# Patient Record
Sex: Female | Born: 1935 | Race: White | Hispanic: No | Marital: Married | Smoking: Never smoker
Health system: Southern US, Community
[De-identification: ages and names within clinical notes are randomized; demographics above are authoritative.]

## PROBLEM LIST (undated history)

## (undated) DIAGNOSIS — G459 Transient cerebral ischemic attack, unspecified: Secondary | ICD-10-CM

## (undated) DIAGNOSIS — I10 Essential (primary) hypertension: Secondary | ICD-10-CM

## (undated) DIAGNOSIS — Z860100 Personal history of colon polyps, unspecified: Secondary | ICD-10-CM

## (undated) DIAGNOSIS — R51 Headache: Secondary | ICD-10-CM

## (undated) DIAGNOSIS — R001 Bradycardia, unspecified: Secondary | ICD-10-CM

## (undated) DIAGNOSIS — I480 Paroxysmal atrial fibrillation: Secondary | ICD-10-CM

## (undated) DIAGNOSIS — E785 Hyperlipidemia, unspecified: Secondary | ICD-10-CM

## (undated) DIAGNOSIS — Z9289 Personal history of other medical treatment: Secondary | ICD-10-CM

## (undated) DIAGNOSIS — K219 Gastro-esophageal reflux disease without esophagitis: Secondary | ICD-10-CM

## (undated) DIAGNOSIS — R7303 Prediabetes: Secondary | ICD-10-CM

## (undated) DIAGNOSIS — Z8711 Personal history of peptic ulcer disease: Secondary | ICD-10-CM

## (undated) DIAGNOSIS — M1990 Unspecified osteoarthritis, unspecified site: Secondary | ICD-10-CM

## (undated) DIAGNOSIS — R519 Headache, unspecified: Secondary | ICD-10-CM

## (undated) DIAGNOSIS — G43909 Migraine, unspecified, not intractable, without status migrainosus: Secondary | ICD-10-CM

## (undated) DIAGNOSIS — C4490 Unspecified malignant neoplasm of skin, unspecified: Secondary | ICD-10-CM

## (undated) DIAGNOSIS — H9190 Unspecified hearing loss, unspecified ear: Secondary | ICD-10-CM

## (undated) DIAGNOSIS — N200 Calculus of kidney: Secondary | ICD-10-CM

## (undated) DIAGNOSIS — Z8719 Personal history of other diseases of the digestive system: Secondary | ICD-10-CM

## (undated) DIAGNOSIS — D509 Iron deficiency anemia, unspecified: Secondary | ICD-10-CM

## (undated) DIAGNOSIS — R42 Dizziness and giddiness: Secondary | ICD-10-CM

## (undated) DIAGNOSIS — H269 Unspecified cataract: Secondary | ICD-10-CM

## (undated) DIAGNOSIS — N184 Chronic kidney disease, stage 4 (severe): Secondary | ICD-10-CM

## (undated) DIAGNOSIS — J302 Other seasonal allergic rhinitis: Secondary | ICD-10-CM

## (undated) DIAGNOSIS — IMO0001 Reserved for inherently not codable concepts without codable children: Secondary | ICD-10-CM

## (undated) HISTORY — PX: COLONOSCOPY: SHX174

## (undated) HISTORY — PX: ESOPHAGOGASTRODUODENOSCOPY: SHX1529

## (undated) HISTORY — PX: SKIN CANCER EXCISION: SHX779

## (undated) HISTORY — PX: TUBAL LIGATION: SHX77

## (undated) HISTORY — PX: APPENDECTOMY: SHX54

## (undated) HISTORY — PX: TONSILLECTOMY AND ADENOIDECTOMY: SUR1326

---

## 1978-09-20 HISTORY — PX: OTHER SURGICAL HISTORY: SHX169

## 2007-01-11 ENCOUNTER — Encounter: Admission: RE | Admit: 2007-01-11 | Discharge: 2007-01-11 | Payer: Self-pay | Admitting: Family Medicine

## 2009-09-04 ENCOUNTER — Encounter: Admission: RE | Admit: 2009-09-04 | Discharge: 2009-09-04 | Payer: Self-pay | Admitting: Family Medicine

## 2012-11-19 HISTORY — PX: KNEE ARTHROSCOPY: SUR90

## 2013-04-10 ENCOUNTER — Other Ambulatory Visit: Payer: Self-pay | Admitting: Orthopedic Surgery

## 2013-04-13 ENCOUNTER — Encounter (HOSPITAL_COMMUNITY): Payer: Self-pay | Admitting: Pharmacy Technician

## 2013-04-14 NOTE — Pre-Procedure Instructions (Signed)
[place] Lin[place]  04/14/2013   Your procedure is scheduled on:  Tuesday, April 7th   Report to [facility]  2 * 3 at  8:30  AM.   Call this number if you have problems the morning of surgery: [phone]   Remember:   Do not eat food or drink liquids after midnight Monday.    Take these medicines the morning of surgery with A SIP OF WATER: Nexium.  Please use Flonase & Restasis   Do not wear jewelry, make-up or nail polish.  Do not wear lotions, powders, or perfumes. You may NOT wear deodorant.  Do not shave underarms & legs 48 hours prior to surgery.    Do not bring valuables to the hospital.  [facility] is not responsible for any belongings or valuables.               Contacts, dentures or bridgework may not be worn into surgery.  Leave suitcase in the car. After surgery it may be brought to your room.  For patients admitted to the hospital, discharge time is determined by your treatment team.              Name and phone number of your driver:    Special Instructions:   "Preparing for Surgery" instruction sheet.   Please read over the following fact sheets that you were given: Pain Booklet, Blood Transfusion Information, MRSA Information and Surgical Site Infection Prevention

## 2013-04-17 ENCOUNTER — Encounter (HOSPITAL_COMMUNITY)
Admission: RE | Admit: 2013-04-17 | Discharge: 2013-04-17 | Disposition: A | Discharge status: Home / Self Care | Payer: Medicare Other | Source: Ambulatory Visit | Attending: Anesthesiology | Admitting: Anesthesiology

## 2013-04-17 ENCOUNTER — Encounter (HOSPITAL_COMMUNITY)
Admission: RE | Admit: 2013-04-17 | Discharge: 2013-04-17 | Disposition: A | Discharge status: Home / Self Care | Payer: Medicare Other | Source: Ambulatory Visit | Attending: Orthopedic Surgery | Admitting: Orthopedic Surgery

## 2013-04-17 ENCOUNTER — Encounter (HOSPITAL_COMMUNITY): Payer: Self-pay

## 2013-04-17 DIAGNOSIS — Z01810 Encounter for preprocedural cardiovascular examination: Secondary | ICD-10-CM | POA: Diagnosis not present

## 2013-04-17 DIAGNOSIS — Z01812 Encounter for preprocedural laboratory examination: Secondary | ICD-10-CM | POA: Insufficient documentation

## 2013-04-17 DIAGNOSIS — Z01818 Encounter for other preprocedural examination: Secondary | ICD-10-CM | POA: Insufficient documentation

## 2013-04-17 HISTORY — DX: Personal history of colonic polyps: Z86.010

## 2013-04-17 HISTORY — DX: Essential (primary) hypertension: I10

## 2013-04-17 HISTORY — DX: Personal history of other medical treatment: Z92.89

## 2013-04-17 HISTORY — DX: Reserved for inherently not codable concepts without codable children: IMO0001

## 2013-04-17 HISTORY — DX: Personal history of peptic ulcer disease: Z87.11

## 2013-04-17 HISTORY — DX: Other seasonal allergic rhinitis: J30.2

## 2013-04-17 HISTORY — DX: Personal history of colon polyps, unspecified: Z86.0100

## 2013-04-17 HISTORY — DX: Unspecified cataract: H26.9

## 2013-04-17 HISTORY — DX: Transient cerebral ischemic attack, unspecified: G45.9

## 2013-04-17 HISTORY — DX: Unspecified osteoarthritis, unspecified site: M19.90

## 2013-04-17 HISTORY — DX: Personal history of other diseases of the digestive system: Z87.19

## 2013-04-17 HISTORY — DX: Gastro-esophageal reflux disease without esophagitis: K21.9

## 2013-04-17 HISTORY — DX: Unspecified hearing loss, unspecified ear: H91.90

## 2013-04-17 HISTORY — DX: Dizziness and giddiness: R42

## 2013-04-17 HISTORY — DX: Hyperlipidemia, unspecified: E78.5

## 2013-04-17 LAB — TYPE AND SCREEN
ABO/RH(D): O POS
Antibody Screen: NEGATIVE

## 2013-04-17 LAB — BASIC METABOLIC PANEL
BUN: 19 mg/dL (ref 6–23)
CO2: 24 mEq/L (ref 19–32)
Calcium: 9.7 mg/dL (ref 8.4–10.5)
Chloride: 96 mEq/L (ref 96–112)
Creatinine, Ser: 1.04 mg/dL (ref 0.50–1.10)
GFR calc Af Amer: 58 mL/min — ABNORMAL LOW (ref >90)
GFR calc non Af Amer: 50 mL/min — ABNORMAL LOW (ref >90)
Glucose, Bld: 102 mg/dL — ABNORMAL HIGH (ref 70–99)
Potassium: 4.1 mEq/L (ref 3.7–5.3)
Sodium: 136 mEq/L — ABNORMAL LOW (ref 137–147)

## 2013-04-17 LAB — CBC
HCT: 44.1 % (ref 36.0–46.0)
Hemoglobin: 14.9 g/dL (ref 12.0–15.0)
MCH: 31.1 pg (ref 26.0–34.0)
MCHC: 33.8 g/dL (ref 30.0–36.0)
MCV: 92.1 fL (ref 78.0–100.0)
Platelets: 305 10*3/uL (ref 150–400)
RBC: 4.79 MIL/uL (ref 3.87–5.11)
RDW: 13.1 % (ref 11.5–15.5)
WBC: 8 10*3/uL (ref 4.0–10.5)

## 2013-04-17 LAB — APTT: aPTT: 32 seconds (ref 24–37)

## 2013-04-17 LAB — SURGICAL PCR SCREEN
MRSA, PCR: NEGATIVE
Staphylococcus aureus: NEGATIVE

## 2013-04-17 LAB — PROTIME-INR
INR: 0.97 (ref 0.00–1.49)
Prothrombin Time: 12.7 seconds (ref 11.6–15.2)

## 2013-04-17 LAB — ABO/RH: ABO/RH(D): O POS

## 2013-04-17 NOTE — Progress Notes (Signed)
Pt doesn't have a cardiologist  Denies ever having a stress test/echo/heart cath  Denies EKG or CXR in past yr  Medical MD is Dr.Sheila Nolon Rod

## 2013-04-17 NOTE — Pre-Procedure Instructions (Signed)
Ashley Nguyen  04/17/2013   Your procedure is scheduled on:  Tues, April 7 @ 10:30 AM  Report to [facility]  at 8:30 AM.  Call this number if you have problems the morning of surgery: [phone]   Remember:   Do not eat food or drink liquids after midnight.   Take these medicines the morning of surgery with A SIP OF WATER: Restasis(Cyclsporine),Nexium(Esomeprazole),and Flonase(Fluticasone)              No Goody's,BC',Aleve,Aspirin,Ibuprofen,Fish Oil,or any Herbal Medications   Do not wear jewelry, make-up or nail polish.  Do not wear lotions, powders, or perfumes. You may wear deodorant.  Do not shave 48 hours prior to surgery.   Do not bring valuables to the hospital.  [facility] is not responsible                  for any belongings or valuables.               Contacts, dentures or bridgework may not be worn into surgery.  Leave suitcase in the car. After surgery it may be brought to your room.  For patients admitted to the hospital, discharge time is determined by your                treatment team.                 Special Instructions:  [facility] - Preparing for Surgery  Before surgery, you can play an important role.  Because skin is not sterile, your skin needs to be as free of germs as possible.  You can reduce the number of germs on you skin by washing with CHG (chlorahexidine gluconate) soap before surgery.  CHG is an antiseptic cleaner which kills germs and bonds with the skin to continue killing germs even after washing.  Please DO NOT use if you have an allergy to CHG or antibacterial soaps.  If your skin becomes reddened/irritated stop using the CHG and inform your nurse when you arrive at Short Stay.  Do not shave (including legs and underarms) for at least 48 hours prior to the first CHG shower.  You may shave your face.  Please follow these instructions carefully:   1.  Shower with CHG Soap the night before surgery and the                                 morning of Surgery.  2.  If you choose to wash your hair, wash your hair first as usual with your       normal shampoo.  3.  After you shampoo, rinse your hair and body thoroughly to remove the                      Shampoo.  4.  Use CHG as you would any other liquid soap.  You can apply chg directly       to the skin and wash gently with scrungie or a clean washcloth.  5.  Apply the CHG Soap to your body ONLY FROM THE NECK DOWN.        Do not use on open wounds or open sores.  Avoid contact with your eyes,       ears, mouth and genitals (private parts).  Wash genitals (private parts)       with your normal soap.  6.  Wash thoroughly, paying special attention to the area where your surgery        will be performed.  7.  Thoroughly rinse your body with warm water from the neck down.  8.  DO NOT shower/wash with your normal soap after using and rinsing off       the CHG Soap.  9.  Pat yourself dry with a clean towel.            10.  Wear clean pajamas.            11.  Place clean sheets on your bed the night of your first shower and do not        sleep with pets.  Day of Surgery  Do not apply any lotions/deoderants the morning of surgery.  Please wear clean clothes to the hospital/surgery center.     Please read over the following fact sheets that you were given: Pain Booklet, Coughing and Deep Breathing, Blood Transfusion Information, MRSA Information and Surgical Site Infection Prevention

## 2013-04-24 MED ORDER — CEFAZOLIN SODIUM-DEXTROSE 2-3 GM-% IV SOLR
2.0000 g | INTRAVENOUS | Status: AC
  Administered 2013-04-25: 2 g via INTRAVENOUS
  Filled 2013-04-24: qty 50

## 2013-04-24 NOTE — Progress Notes (Signed)
Patient notified of new arrival time of 07:40 for surgery on 04/24/13 verbalized understanding

## 2013-04-25 ENCOUNTER — Inpatient Hospital Stay (HOSPITAL_COMMUNITY): Payer: Medicare Other | Admitting: Anesthesiology

## 2013-04-25 ENCOUNTER — Inpatient Hospital Stay (HOSPITAL_COMMUNITY)
Admission: RE | Admit: 2013-04-25 | Discharge: 2013-04-28 | DRG: 470 | Disposition: A | Discharge status: Home / Self Care | Payer: Medicare Other | Source: Ambulatory Visit | Attending: Orthopedic Surgery | Admitting: Orthopedic Surgery

## 2013-04-25 ENCOUNTER — Inpatient Hospital Stay (HOSPITAL_COMMUNITY): Payer: Medicare Other

## 2013-04-25 ENCOUNTER — Encounter (HOSPITAL_COMMUNITY): Payer: Self-pay | Admitting: *Deleted

## 2013-04-25 ENCOUNTER — Encounter (HOSPITAL_COMMUNITY)
Admission: RE | Disposition: A | Discharge status: Home / Self Care | Payer: Self-pay | Source: Ambulatory Visit | Attending: Orthopedic Surgery

## 2013-04-25 ENCOUNTER — Encounter (HOSPITAL_COMMUNITY): Payer: Medicare Other | Admitting: Anesthesiology

## 2013-04-25 DIAGNOSIS — E119 Type 2 diabetes mellitus without complications: Secondary | ICD-10-CM | POA: Diagnosis present

## 2013-04-25 DIAGNOSIS — K219 Gastro-esophageal reflux disease without esophagitis: Secondary | ICD-10-CM | POA: Diagnosis present

## 2013-04-25 DIAGNOSIS — I4891 Unspecified atrial fibrillation: Secondary | ICD-10-CM | POA: Diagnosis not present

## 2013-04-25 DIAGNOSIS — Z8719 Personal history of other diseases of the digestive system: Secondary | ICD-10-CM | POA: Insufficient documentation

## 2013-04-25 DIAGNOSIS — E669 Obesity, unspecified: Secondary | ICD-10-CM | POA: Diagnosis present

## 2013-04-25 DIAGNOSIS — Z8673 Personal history of transient ischemic attack (TIA), and cerebral infarction without residual deficits: Secondary | ICD-10-CM

## 2013-04-25 DIAGNOSIS — M1710 Unilateral primary osteoarthritis, unspecified knee: Secondary | ICD-10-CM | POA: Diagnosis present

## 2013-04-25 DIAGNOSIS — Z6836 Body mass index (BMI) 36.0-36.9, adult: Secondary | ICD-10-CM

## 2013-04-25 DIAGNOSIS — H9190 Unspecified hearing loss, unspecified ear: Secondary | ICD-10-CM | POA: Diagnosis present

## 2013-04-25 DIAGNOSIS — E785 Hyperlipidemia, unspecified: Secondary | ICD-10-CM | POA: Diagnosis present

## 2013-04-25 DIAGNOSIS — I480 Paroxysmal atrial fibrillation: Secondary | ICD-10-CM

## 2013-04-25 DIAGNOSIS — Z79899 Other long term (current) drug therapy: Secondary | ICD-10-CM

## 2013-04-25 DIAGNOSIS — I10 Essential (primary) hypertension: Secondary | ICD-10-CM | POA: Diagnosis present

## 2013-04-25 HISTORY — DX: Paroxysmal atrial fibrillation: I48.0

## 2013-04-25 HISTORY — DX: Unspecified osteoarthritis, unspecified site: M19.90

## 2013-04-25 HISTORY — PX: TOTAL KNEE ARTHROPLASTY: SHX125

## 2013-04-25 LAB — GLUCOSE, CAPILLARY: Glucose-Capillary: 127 mg/dL — ABNORMAL HIGH (ref 70–99)

## 2013-04-25 SURGERY — ARTHROPLASTY, KNEE, TOTAL
Anesthesia: General | Site: Knee | Laterality: Left

## 2013-04-25 MED ORDER — BUPIVACAINE LIPOSOME 1.3 % IJ SUSP
20.0000 mL | INTRAMUSCULAR | Status: DC
  Filled 2013-04-25: qty 20

## 2013-04-25 MED ORDER — METOCLOPRAMIDE HCL 5 MG/ML IJ SOLN
5.0000 mg | Freq: Three times a day (TID) | INTRAMUSCULAR | Status: DC | PRN
  Administered 2013-04-25: 10 mg via INTRAVENOUS
  Filled 2013-04-25: qty 2

## 2013-04-25 MED ORDER — ONDANSETRON HCL 4 MG PO TABS: 4.0000 mg | ORAL_TABLET | Freq: Four times a day (QID) | ORAL | Status: DC | PRN

## 2013-04-25 MED ORDER — BUPIVACAINE LIPOSOME 1.3 % IJ SUSP
INTRAMUSCULAR | Status: DC | PRN
  Administered 2013-04-25: 20 mL

## 2013-04-25 MED ORDER — FENTANYL CITRATE 0.05 MG/ML IJ SOLN
INTRAMUSCULAR | Status: DC | PRN
  Administered 2013-04-25: 25 ug via INTRAVENOUS
  Administered 2013-04-25 (×5): 50 ug via INTRAVENOUS
  Administered 2013-04-25: 25 ug via INTRAVENOUS
  Administered 2013-04-25: 50 ug via INTRAVENOUS
  Administered 2013-04-25 (×2): 25 ug via INTRAVENOUS

## 2013-04-25 MED ORDER — PANTOPRAZOLE SODIUM 40 MG PO TBEC
40.0000 mg | DELAYED_RELEASE_TABLET | Freq: Every day | ORAL | Status: DC
Start: 2013-04-26 — End: 2013-04-28
  Administered 2013-04-26 – 2013-04-28 (×3): 40 mg via ORAL
  Filled 2013-04-25 (×3): qty 1

## 2013-04-25 MED ORDER — PROPRANOLOL HCL 1 MG/ML IV SOLN
INTRAVENOUS | Status: AC
  Filled 2013-04-25: qty 1

## 2013-04-25 MED ORDER — BUPIVACAINE-EPINEPHRINE PF 0.5-1:200000 % IJ SOLN
INTRAMUSCULAR | Status: DC | PRN
  Administered 2013-04-25: 25 mL via PERINEURAL

## 2013-04-25 MED ORDER — EPHEDRINE SULFATE 50 MG/ML IJ SOLN
INTRAMUSCULAR | Status: AC
  Filled 2013-04-25: qty 1

## 2013-04-25 MED ORDER — FLUTICASONE PROPIONATE 50 MCG/ACT NA SUSP
1.0000 | Freq: Every day | NASAL | Status: DC
  Administered 2013-04-25 – 2013-04-27 (×3): 1 via NASAL
  Filled 2013-04-25: qty 16

## 2013-04-25 MED ORDER — ONDANSETRON HCL 4 MG/2ML IJ SOLN
4.0000 mg | Freq: Four times a day (QID) | INTRAMUSCULAR | Status: DC | PRN
  Administered 2013-04-25: 4 mg via INTRAVENOUS

## 2013-04-25 MED ORDER — PROMETHAZINE HCL 25 MG/ML IJ SOLN: 6.2500 mg | INTRAMUSCULAR | Status: DC | PRN

## 2013-04-25 MED ORDER — LISINOPRIL-HYDROCHLOROTHIAZIDE 20-25 MG PO TABS: 1.0000 | ORAL_TABLET | Freq: Every day | ORAL | Status: DC

## 2013-04-25 MED ORDER — MIDAZOLAM HCL 5 MG/5ML IJ SOLN
INTRAMUSCULAR | Status: DC | PRN
  Administered 2013-04-25: 1 mg via INTRAVENOUS

## 2013-04-25 MED ORDER — SODIUM CHLORIDE 0.9 % IR SOLN
Status: DC | PRN
  Administered 2013-04-25: 1000 mL

## 2013-04-25 MED ORDER — LIDOCAINE HCL (CARDIAC) 20 MG/ML IV SOLN
INTRAVENOUS | Status: AC
  Filled 2013-04-25: qty 5

## 2013-04-25 MED ORDER — GLYCOPYRROLATE 0.2 MG/ML IJ SOLN
INTRAMUSCULAR | Status: AC
Start: 2013-04-25 — End: 2013-04-25
  Filled 2013-04-25: qty 2

## 2013-04-25 MED ORDER — MENTHOL 3 MG MT LOZG: 1.0000 | LOZENGE | OROMUCOSAL | Status: DC | PRN

## 2013-04-25 MED ORDER — METOCLOPRAMIDE HCL 5 MG PO TABS
5.0000 mg | ORAL_TABLET | Freq: Three times a day (TID) | ORAL | Status: DC | PRN
  Filled 2013-04-25: qty 2

## 2013-04-25 MED ORDER — OXYCODONE HCL 5 MG PO TABS: 5.0000 mg | ORAL_TABLET | Freq: Once | ORAL | Status: DC | PRN

## 2013-04-25 MED ORDER — DEXTROSE-NACL 5-0.45 % IV SOLN
INTRAVENOUS | Status: AC
  Administered 2013-04-25: 20:00:00 via INTRAVENOUS

## 2013-04-25 MED ORDER — FENTANYL CITRATE 0.05 MG/ML IJ SOLN
INTRAMUSCULAR | Status: AC
  Filled 2013-04-25: qty 5

## 2013-04-25 MED ORDER — NEOSTIGMINE METHYLSULFATE 1 MG/ML IJ SOLN
INTRAMUSCULAR | Status: DC | PRN
  Administered 2013-04-25: 3 mg via INTRAVENOUS

## 2013-04-25 MED ORDER — HYDROMORPHONE HCL PF 1 MG/ML IJ SOLN
INTRAMUSCULAR | Status: AC
  Filled 2013-04-25: qty 1

## 2013-04-25 MED ORDER — ONDANSETRON HCL 4 MG/2ML IJ SOLN
INTRAMUSCULAR | Status: AC
  Filled 2013-04-25: qty 2

## 2013-04-25 MED ORDER — HYDROMORPHONE HCL PF 1 MG/ML IJ SOLN
0.2500 mg | INTRAMUSCULAR | Status: DC | PRN
  Administered 2013-04-25 (×3): 0.5 mg via INTRAVENOUS

## 2013-04-25 MED ORDER — ROCURONIUM BROMIDE 100 MG/10ML IV SOLN
INTRAVENOUS | Status: DC | PRN
  Administered 2013-04-25: 20 mg via INTRAVENOUS

## 2013-04-25 MED ORDER — ARTIFICIAL TEARS OP OINT
TOPICAL_OINTMENT | OPHTHALMIC | Status: AC
  Filled 2013-04-25: qty 3.5

## 2013-04-25 MED ORDER — METOCLOPRAMIDE HCL 5 MG/5ML PO SOLN
10.0000 mg | Freq: Four times a day (QID) | ORAL | Status: DC | PRN
  Filled 2013-04-25: qty 10

## 2013-04-25 MED ORDER — LIDOCAINE HCL (CARDIAC) 20 MG/ML IV SOLN
INTRAVENOUS | Status: DC | PRN
  Administered 2013-04-25: 60 mg via INTRAVENOUS

## 2013-04-25 MED ORDER — PROPRANOLOL HCL 1 MG/ML IV SOLN
INTRAVENOUS | Status: DC | PRN
Start: 2013-04-25 — End: 2013-04-25
  Administered 2013-04-25 (×4): .25 mg via INTRAVENOUS

## 2013-04-25 MED ORDER — DEXAMETHASONE SODIUM PHOSPHATE 4 MG/ML IJ SOLN
INTRAMUSCULAR | Status: DC | PRN
  Administered 2013-04-25: 4 mg via INTRAVENOUS

## 2013-04-25 MED ORDER — OXYCODONE HCL 5 MG/5ML PO SOLN: 5.0000 mg | Freq: Once | ORAL | Status: DC | PRN

## 2013-04-25 MED ORDER — LISINOPRIL 20 MG PO TABS
20.0000 mg | ORAL_TABLET | Freq: Every day | ORAL | Status: DC
  Filled 2013-04-25 (×3): qty 1

## 2013-04-25 MED ORDER — METOPROLOL TARTRATE 25 MG PO TABS
25.0000 mg | ORAL_TABLET | Freq: Two times a day (BID) | ORAL | Status: DC
  Administered 2013-04-25 – 2013-04-28 (×7): 25 mg via ORAL
  Filled 2013-04-25 (×7): qty 1

## 2013-04-25 MED ORDER — ACETAMINOPHEN 650 MG RE SUPP: 650.0000 mg | Freq: Four times a day (QID) | RECTAL | Status: DC | PRN

## 2013-04-25 MED ORDER — MIDAZOLAM HCL 2 MG/2ML IJ SOLN
INTRAMUSCULAR | Status: AC
  Filled 2013-04-25: qty 2

## 2013-04-25 MED ORDER — ACETAMINOPHEN 325 MG PO TABS
650.0000 mg | ORAL_TABLET | Freq: Four times a day (QID) | ORAL | Status: DC | PRN
  Administered 2013-04-26 – 2013-04-27 (×4): 650 mg via ORAL
  Filled 2013-04-25 (×4): qty 2

## 2013-04-25 MED ORDER — MORPHINE SULFATE 2 MG/ML IJ SOLN
2.0000 mg | INTRAMUSCULAR | Status: DC | PRN
  Administered 2013-04-25 (×2): 2 mg via INTRAVENOUS
  Filled 2013-04-25 (×2): qty 1

## 2013-04-25 MED ORDER — CYCLOSPORINE 0.05 % OP EMUL
1.0000 [drp] | Freq: Two times a day (BID) | OPHTHALMIC | Status: DC
  Administered 2013-04-25 – 2013-04-28 (×6): 1 [drp] via OPHTHALMIC
  Filled 2013-04-25 (×7): qty 1

## 2013-04-25 MED ORDER — OXYCODONE HCL 5 MG PO TABS
5.0000 mg | ORAL_TABLET | ORAL | Status: DC | PRN
  Administered 2013-04-25: 10 mg via ORAL
  Administered 2013-04-26: 5 mg via ORAL
  Administered 2013-04-26: 10 mg via ORAL
  Administered 2013-04-26: 5 mg via ORAL
  Administered 2013-04-26 – 2013-04-27 (×5): 10 mg via ORAL
  Administered 2013-04-27 (×2): 5 mg via ORAL
  Administered 2013-04-28 (×2): 10 mg via ORAL
  Filled 2013-04-25 (×3): qty 1
  Filled 2013-04-25 (×4): qty 2
  Filled 2013-04-25: qty 1
  Filled 2013-04-25: qty 2
  Filled 2013-04-25 (×3): qty 1
  Filled 2013-04-25 (×3): qty 2

## 2013-04-25 MED ORDER — FENTANYL CITRATE 0.05 MG/ML IJ SOLN
50.0000 ug | INTRAMUSCULAR | Status: DC | PRN
  Administered 2013-04-25: 50 ug via INTRAVENOUS
  Filled 2013-04-25: qty 2

## 2013-04-25 MED ORDER — PHENOL 1.4 % MT LIQD: 1.0000 | OROMUCOSAL | Status: DC | PRN

## 2013-04-25 MED ORDER — GLYCOPYRROLATE 0.2 MG/ML IJ SOLN
INTRAMUSCULAR | Status: DC | PRN
  Administered 2013-04-25: 0.4 mg via INTRAVENOUS

## 2013-04-25 MED ORDER — MIDAZOLAM HCL 2 MG/2ML IJ SOLN
1.0000 mg | INTRAMUSCULAR | Status: DC | PRN
  Administered 2013-04-25: 1 mg via INTRAVENOUS
  Filled 2013-04-25: qty 2

## 2013-04-25 MED ORDER — ONDANSETRON HCL 4 MG/2ML IJ SOLN
INTRAMUSCULAR | Status: DC | PRN
  Administered 2013-04-25 (×2): 4 mg via INTRAVENOUS

## 2013-04-25 MED ORDER — ROCURONIUM BROMIDE 50 MG/5ML IV SOLN
INTRAVENOUS | Status: AC
  Filled 2013-04-25: qty 1

## 2013-04-25 MED ORDER — SUCCINYLCHOLINE CHLORIDE 20 MG/ML IJ SOLN
INTRAMUSCULAR | Status: DC | PRN
  Administered 2013-04-25: 100 mg via INTRAVENOUS

## 2013-04-25 MED ORDER — HYDROCHLOROTHIAZIDE 25 MG PO TABS
25.0000 mg | ORAL_TABLET | Freq: Every day | ORAL | Status: DC
  Filled 2013-04-25 (×3): qty 1

## 2013-04-25 MED ORDER — CEFAZOLIN SODIUM-DEXTROSE 2-3 GM-% IV SOLR
2.0000 g | Freq: Four times a day (QID) | INTRAVENOUS | Status: AC
  Administered 2013-04-25 (×2): 2 g via INTRAVENOUS
  Filled 2013-04-25 (×3): qty 50

## 2013-04-25 MED ORDER — SUCCINYLCHOLINE CHLORIDE 20 MG/ML IJ SOLN
INTRAMUSCULAR | Status: AC
  Filled 2013-04-25: qty 1

## 2013-04-25 MED ORDER — SODIUM CHLORIDE 0.9 % IJ SOLN
INTRAMUSCULAR | Status: AC
  Filled 2013-04-25: qty 10

## 2013-04-25 MED ORDER — DEXAMETHASONE SODIUM PHOSPHATE 4 MG/ML IJ SOLN
INTRAMUSCULAR | Status: AC
  Filled 2013-04-25: qty 1

## 2013-04-25 MED ORDER — LACTATED RINGERS IV SOLN
INTRAVENOUS | Status: DC
  Administered 2013-04-25 (×2): via INTRAVENOUS

## 2013-04-25 MED ORDER — ASPIRIN EC 325 MG PO TBEC
325.0000 mg | DELAYED_RELEASE_TABLET | Freq: Every day | ORAL | Status: DC
  Administered 2013-04-26 – 2013-04-28 (×3): 325 mg via ORAL
  Filled 2013-04-25 (×4): qty 1

## 2013-04-25 MED ORDER — NEOSTIGMINE METHYLSULFATE 1 MG/ML IJ SOLN
INTRAMUSCULAR | Status: AC
  Filled 2013-04-25: qty 10

## 2013-04-25 MED ORDER — DOCUSATE SODIUM 100 MG PO CAPS
100.0000 mg | ORAL_CAPSULE | Freq: Two times a day (BID) | ORAL | Status: DC
  Administered 2013-04-25 – 2013-04-28 (×6): 100 mg via ORAL
  Filled 2013-04-25 (×6): qty 1

## 2013-04-25 MED ORDER — SODIUM CHLORIDE 0.9 % IJ SOLN
INTRAMUSCULAR | Status: DC | PRN
  Administered 2013-04-25: 40 mL

## 2013-04-25 MED ORDER — PROPOFOL 10 MG/ML IV BOLUS
INTRAVENOUS | Status: DC | PRN
  Administered 2013-04-25: 50 mg via INTRAVENOUS
  Administered 2013-04-25: 150 mg via INTRAVENOUS

## 2013-04-25 SURGICAL SUPPLY — 66 items
BANDAGE ELASTIC 6 VELCRO ST LF (GAUZE/BANDAGES/DRESSINGS) ×2 IMPLANT
BANDAGE ESMARK 6X9 LF (GAUZE/BANDAGES/DRESSINGS) ×1 IMPLANT
BLADE SAG 18X100X1.27 (BLADE) ×4 IMPLANT
BNDG COHESIVE 6X5 TAN STRL LF (GAUZE/BANDAGES/DRESSINGS) ×2 IMPLANT
BNDG ESMARK 6X9 LF (GAUZE/BANDAGES/DRESSINGS) ×2
BOWL SMART MIX CTS (DISPOSABLE) ×2 IMPLANT
CEMENT BONE SIMPLEX SPEEDSET (Cement) ×4 IMPLANT
COVER SURGICAL LIGHT HANDLE (MISCELLANEOUS) ×2 IMPLANT
CUFF TOURNIQUET SINGLE 34IN LL (TOURNIQUET CUFF) ×2 IMPLANT
DRAPE EXTREMITY T 121X128X90 (DRAPE) ×2 IMPLANT
DRAPE IMP U-DRAPE 54X76 (DRAPES) ×2 IMPLANT
DRAPE PROXIMA HALF (DRAPES) ×2 IMPLANT
DRAPE U-SHAPE 47X51 STRL (DRAPES) ×2 IMPLANT
DRSG ADAPTIC 3X8 NADH LF (GAUZE/BANDAGES/DRESSINGS) IMPLANT
DRSG PAD ABDOMINAL 8X10 ST (GAUZE/BANDAGES/DRESSINGS) ×2 IMPLANT
DURAPREP 26ML APPLICATOR (WOUND CARE) ×4 IMPLANT
ELECT CAUTERY BLADE 6.4 (BLADE) ×2 IMPLANT
ELECT REM PT RETURN 9FT ADLT (ELECTROSURGICAL) ×2
ELECTRODE REM PT RTRN 9FT ADLT (ELECTROSURGICAL) ×1 IMPLANT
FACESHIELD WRAPAROUND (MASK) ×2 IMPLANT
GLOVE BIO SURGEON STRL SZ7 (GLOVE) ×2 IMPLANT
GLOVE BIO SURGEON STRL SZ7.5 (GLOVE) ×2 IMPLANT
GLOVE BIOGEL PI IND STRL 6.5 (GLOVE) ×1 IMPLANT
GLOVE BIOGEL PI IND STRL 7.0 (GLOVE) ×1 IMPLANT
GLOVE BIOGEL PI IND STRL 8 (GLOVE) ×2 IMPLANT
GLOVE BIOGEL PI INDICATOR 6.5 (GLOVE) ×1
GLOVE BIOGEL PI INDICATOR 7.0 (GLOVE) ×1
GLOVE BIOGEL PI INDICATOR 8 (GLOVE) ×2
GOWN STRL REUS W/ TWL LRG LVL3 (GOWN DISPOSABLE) ×2 IMPLANT
GOWN STRL REUS W/ TWL XL LVL3 (GOWN DISPOSABLE) IMPLANT
GOWN STRL REUS W/TWL LRG LVL3 (GOWN DISPOSABLE) ×2
GOWN STRL REUS W/TWL XL LVL3 (GOWN DISPOSABLE)
HANDPIECE INTERPULSE COAX TIP (DISPOSABLE) ×1
IMMOBILIZER KNEE 20 (SOFTGOODS) ×2
IMMOBILIZER KNEE 20 THIGH 36 (SOFTGOODS) ×1 IMPLANT
IMMOBILIZER KNEE 22 UNIV (SOFTGOODS) ×2 IMPLANT
IMMOBILIZER KNEE 24 THIGH 36 (MISCELLANEOUS) IMPLANT
IMMOBILIZER KNEE 24 UNIV (MISCELLANEOUS)
KIT BASIN OR (CUSTOM PROCEDURE TRAY) ×2 IMPLANT
KIT ROOM TURNOVER OR (KITS) ×2 IMPLANT
KNEE/VIT E POLY LINER LEVEL 1B ×2 IMPLANT
MANIFOLD NEPTUNE II (INSTRUMENTS) ×2 IMPLANT
NEEDLE 18GX1X1/2 (RX/OR ONLY) (NEEDLE) ×2 IMPLANT
NS IRRIG 1000ML POUR BTL (IV SOLUTION) ×2 IMPLANT
PACK TOTAL JOINT (CUSTOM PROCEDURE TRAY) ×2 IMPLANT
PAD ABD 8X10 STRL (GAUZE/BANDAGES/DRESSINGS) ×2 IMPLANT
PAD ARMBOARD 7.5X6 YLW CONV (MISCELLANEOUS) ×4 IMPLANT
PAD CAST 4YDX4 CTTN HI CHSV (CAST SUPPLIES) IMPLANT
PADDING CAST COTTON 4X4 STRL (CAST SUPPLIES)
PADDING CAST COTTON 6X4 STRL (CAST SUPPLIES) ×2 IMPLANT
SET HNDPC FAN SPRY TIP SCT (DISPOSABLE) ×1 IMPLANT
SPONGE GAUZE 4X4 12PLY (GAUZE/BANDAGES/DRESSINGS) ×2 IMPLANT
STAPLER VISISTAT 35W (STAPLE) IMPLANT
STRIP CLOSURE SKIN 1/2X4 (GAUZE/BANDAGES/DRESSINGS) ×2 IMPLANT
SUCTION FRAZIER TIP 10 FR DISP (SUCTIONS) ×2 IMPLANT
SUT MNCRL AB 4-0 PS2 18 (SUTURE) ×2 IMPLANT
SUT MON AB 2-0 CT1 27 (SUTURE) ×4 IMPLANT
SUT VIC AB 0 CT1 27 (SUTURE) ×1
SUT VIC AB 0 CT1 27XBRD ANBCTR (SUTURE) ×1 IMPLANT
SUT VIC AB 1 CTX 36 (SUTURE) ×2
SUT VIC AB 1 CTX36XBRD ANBCTR (SUTURE) ×2 IMPLANT
SYR 50ML LL SCALE MARK (SYRINGE) ×2 IMPLANT
TOWEL OR 17X24 6PK STRL BLUE (TOWEL DISPOSABLE) ×2 IMPLANT
TOWEL OR 17X26 10 PK STRL BLUE (TOWEL DISPOSABLE) ×2 IMPLANT
TRAY FOLEY BAG SILVER LF 14FR (CATHETERS) IMPLANT
WATER STERILE IRR 1000ML POUR (IV SOLUTION) ×4 IMPLANT

## 2013-04-25 NOTE — Consult Note (Signed)
Cardiology Consult Note  Admit date: 04/25/2013 Name: Ashley Nguyen 80 y.o.  female DOB:  [birth year] MRN:  [id number]  Today's date:  04/25/2013  Referring Physician:    Dr. [name]  Primary Physician:    Dr. Lynne Logan  Reason for Consultation:    Atrial fibrillation   IMPRESSIONS: 1. Perioperative atrial fibrillation that was transient likely due to the stress of anesthesia 2. Obesity 3. Hypertension 4. Diabetes mellitus 5. History of GI bleeding while on Plavix 6. History of TIA  RECOMMENDATION: 1. Add beta blockade and continue telemetry monitoring 2. Obtain echocardiogram to assess left atrial size 3. Obtain TSH 4. Consider anticoagulation if she has recurrent atrial fibrillation but she does have a significant history of GI bleeding in the past while on Plavix  HISTORY: This very nice 80 year old female had elective knee surgery for severe osteoarthritis today. She has no previous cardiac history but developed atrial fibrillation while under anesthesia that converted reportedly with intravenous beta blocker. I was unable to find any record of this in the chart. She is currently resting comfortably without shortness of breath or chest pain. She has no previous history of arrhythmias. She has a history of a TIA 13 years ago and was placed on Plavix at that time but eventually developed significant GI bleeding from an ulcer and this had to be stopped. She denies angina and has no PND, orthopnea or edema.  Past Medical History  Diagnosis Date  . Hypertension   . GERD (gastroesophageal reflux disease)   . Hyperlipidemia   . Cataracts, bilateral   . Impaired hearing   . TIA (transient ischemic attack) 70yrs ago  . Seasonal allergies   . History of migraine     last time well over a month ago  . Vertigo   . History of gastric ulcer   . History of GI bleed   . History of colon polyps   . History of kidney stones   . Anemia     at times has been on iron  supplements  . Diabetes mellitus without complication     borderline      Past Surgical History  Procedure Laterality Date  . Tonsillectomy      adenoidectomy  . Tubal ligation    . Knee arthroscopy Left   . Thumb surgery Right   . Appendectomy      as a child  . Esophagogastroduodenoscopy    . Colonoscopy       Allergies:  is allergic to clams; desipramine hcl; lipitor; plavix; sulfa antibiotics; verapamil; and penicillins.   Medications: Prior to Admission medications   Medication Sig Start Date End Date Taking? Authorizing Provider  celecoxib (CELEBREX) 200 MG capsule Take 200 mg by mouth daily.   Yes Historical Provider, MD  colesevelam (WELCHOL) 625 MG tablet Take 625 mg by mouth 2 (two) times daily with a meal.   Yes Historical Provider, MD  cycloSPORINE (RESTASIS) 0.05 % ophthalmic emulsion Place 1 drop into both eyes 2 (two) times daily.   Yes Historical Provider, MD  esomeprazole (NEXIUM) 40 MG capsule Take 40 mg by mouth daily.   Yes Historical Provider, MD  fluticasone (FLONASE) 50 MCG/ACT nasal spray Place 1 spray into the nose daily.   Yes Historical Provider, MD  lisinopril-hydrochlorothiazide (PRINZIDE,ZESTORETIC) 20-25 MG per tablet Take 1 tablet by mouth daily.   Yes Historical Provider, MD   Family History: Family Status  Relation Status Death Age  . Father Deceased 2  died of CVA  . Mother Deceased 20    died of uterine cancer  . Brother Deceased 6    died of colon cancer  . Sister Alive     Social History:   reports that she has never smoked. She has never used smokeless tobacco. She reports that she does not drink alcohol or use illicit drugs.   History   Social History Narrative   M[employer]          Review of Systems: She has been obese for many years. She has significant hearing loss and wears a hearing aid. She is migraine headaches. She has significant issues with vertigo. She has occasional dyspepsia. She has significant dry eyes and  has severe arthritis. She has had intolerance to statins in the past and is currently being treated with WelChol 4 hyperlipidemia. Other than as noted above the remainder of the review of systems is unremarkable.  Physical Exam: BP 161/97  Pulse 87  Temp(Src) 97 F (36.1 C) (Oral)  Resp 20  Wt 98.175 kg (216 lb 7 oz)  SpO2 98%  General appearance:  obeese, somewhat pale pleasant white female in no acute distress Head: Normocephalic, without obvious abnormality, atraumatic Eyes: conjunctivae/corneas clear. PERRL, EOM's intact. Fundi not examined  Ears: Hearing aid present in left ear  Neck: no adenopathy, no carotid bruit, no JVD and supple, symmetrical, trachea midline Lungs: clear to auscultation bilaterally Heart: regular rate and rhythm, S1, S2 normal, no murmur, click, rub or gallop Abdomen: soft, non-tender; bowel sounds normal; no masses,  no organomegaly Pelvic: deferred Extremities: Left leg is currently in an orthopedic appliance, right leg with trace edema Pulses: 2+ and symmetric Neurologic: Grossly normal   Labs: Not available at the time of review of chart, preoperative labs were normal.   Radiology: Normal heart size, clear lungs  EKG: Sinus rhythm, left ventricular hypertrophy  Signed:  W. Doristine Church MD [facility]   Cardiology Consultant  04/25/2013, 5:21 PM

## 2013-04-25 NOTE — H&P (Signed)
ORTHOPAEDIC CONSULTATION  REQUESTING PHYSICIAN: Renette Butters, MD  Chief Complaint: Left knee OA  HPI: Ashley Nguyen is a 80 y.o. female who complains of  pain  Past Medical History  Diagnosis Date  . Hypertension     takes Lisinopril-HCTZ daily  . GERD (gastroesophageal reflux disease)     takes Nexium daily  . Hyperlipidemia     takes Welchol daily  . Cataracts, bilateral   . Impaired hearing   . PONV (postoperative nausea and vomiting)   . TIA (transient ischemic attack) 35yr ago  . Seasonal allergies     uses Flonase daily  . History of migraine     last time well over a month ago  . Vertigo     but doesn't take any meds  . Arthritis   . Joint pain   . Joint swelling   . History of gastric ulcer   . History of GI bleed   . History of colon polyps   . Constipation     d/t being on Welchol  . Urinary frequency   . Urinary urgency   . Nocturia   . History of kidney stones   . Anemia     at times has been on iron supplements  . History of blood transfusion     no abnormal reaction noted  . Diabetes mellitus without complication     borderline   Past Surgical History  Procedure Laterality Date  . Tonsillectomy      adenoidectomy  . Tubal ligation    . Knee arthroscopy Left   . Thumb surgery Right   . Appendectomy      as a child  . Esophagogastroduodenoscopy    . Colonoscopy     History   Social History  . Marital Status: Married    Spouse Name: N/A    Number of Children: N/A  . Years of Education: N/A   Social History Main Topics  . Smoking status: Never Smoker   . Smokeless tobacco: Not on file  . Alcohol Use: No  . Drug Use: No  . Sexual Activity: Yes    Birth Control/ Protection: Post-menopausal   Other Topics Concern  . Not on file   Social History Narrative  . No narrative on file   No family history on file. Allergies  Allergen Reactions  . Clams [Shellfish Allergy] Nausea And Vomiting    Per pt, NOT a  general shellfish allergy.  Clams only.  . Desipramine Hcl Other (See Comments)    "painful urination, then kidneys shut down for hours."  . Lipitor [Atorvastatin] Other (See Comments)    Muscle aches.  . Plavix [Clopidogrel Bisulfate] Other (See Comments)    "Almost bled to death."  . Sulfa Antibiotics Hives  . Verapamil Swelling  . Penicillins Hives   Prior to Admission medications   Medication Sig Start Date End Date Taking? Authorizing Provider  celecoxib (CELEBREX) 200 MG capsule Take 200 mg by mouth daily.   Yes Historical Provider, MD  colesevelam (WELCHOL) 625 MG tablet Take 625 mg by mouth 2 (two) times daily with a meal.   Yes Historical Provider, MD  cycloSPORINE (RESTASIS) 0.05 % ophthalmic emulsion Place 1 drop into both eyes 2 (two) times daily.   Yes Historical Provider, MD  esomeprazole (NEXIUM) 40 MG capsule Take 40 mg by mouth daily.   Yes Historical Provider, MD  fluticasone (FLONASE) 50 MCG/ACT nasal spray Place 1 spray into the nose  daily.   Yes Historical Provider, MD  lisinopril-hydrochlorothiazide (PRINZIDE,ZESTORETIC) 20-25 MG per tablet Take 1 tablet by mouth daily.   Yes Historical Provider, MD   No results found.  Positive ROS: All other systems have been reviewed and were otherwise negative with the exception of those mentioned in the HPI and as above.  Labs cbc No results found for this basename: WBC, HGB, HCT, PLT,  in the last 72 hours  Labs inflam No results found for this basename: ESR, CRP,  in the last 72 hours  Labs coag No results found for this basename: INR, PT, PTT,  in the last 72 hours  No results found for this basename: NA, K, CL, CO2, GLUCOSE, BUN, CREATININE, CALCIUM,  in the last 72 hours  Physical Exam: There were no vitals filed for this visit. General: Alert, no acute distress Cardiovascular: No pedal edema Respiratory: No cyanosis, no use of accessory musculature GI: No organomegaly, abdomen is soft and non-tender Skin: No  lesions in the area of chief complaint Neurologic: Sensation intact distally Psychiatric: Patient is competent for consent with normal mood and affect Lymphatic: No axillary or cervical lymphadenopathy  MUSCULOSKELETAL:  Skin benign, NVI Other extremities are atraumatic with painless ROM and NVI.  Assessment: Left knee OA  Plan: Left TKA    [name], D, MD Cell [phone]   04/25/2013 7:13 AM

## 2013-04-25 NOTE — Anesthesia Postprocedure Evaluation (Signed)
  Anesthesia Post-op Note  Patient: Ashley Nguyen  Procedure(s) Performed: Procedure(s): TOTAL KNEE ARTHROPLASTY (Left)  Patient Location: PACU  Anesthesia Type:GA combined with regional for post-op pain  Level of Consciousness: awake and alert   Airway and Oxygen Therapy: Patient Spontanous Breathing  Post-op Pain: mild  Post-op Assessment: Post-op Vital signs reviewed  Post-op Vital Signs: stable  Complications: No apparent anesthesia complications

## 2013-04-25 NOTE — Op Note (Signed)
DATE OF SURGERY:  04/25/2013 TIME: 11:12 AM  PATIENT NAME:  Ashley Nguyen   AGE: 80 y.o.    PRE-OPERATIVE DIAGNOSIS:  OA LEFT KNEE  POST-OPERATIVE DIAGNOSIS:  Same  PROCEDURE:  Procedure(s): TOTAL KNEE ARTHROPLASTY   SURGEON:  [name], D, MD   ASSISTANT:  Joya Gaskins OPA   OPERATIVE IMPLANTS: Stryker Triathlon Posterior Stabilized.  Femur size 4, Tibia size 4, Patella size 29 3-peg oval button, with a 9 mm polyethylene insert.   PREOPERATIVE INDICATIONS:  Ashley Anne Rauch is a 80 y.o. year old female with end stage bone on bone degenerative arthritis of the knee who failed conservative treatment, including injections, antiinflammatories, activity modification, and assistive devices, and had significant impairment of their activities of daily living, and elected for Total Knee Arthroplasty.   The risks, benefits, and alternatives were discussed at length including but not limited to the risks of infection, bleeding, nerve injury, stiffness, blood clots, the need for revision surgery, cardiopulmonary complications, among others, and they were willing to proceed.   OPERATIVE DESCRIPTION:  The patient was brought to the operative room and placed in a supine position.  General anesthesia was administered.  IV antibiotics were given.  The lower extremity was prepped and draped in the usual sterile fashion.  Time out was performed.  The leg was elevated and exsanguinated and the tourniquet was inflated.  Anterior approach was performed.  The patella was everted and osteophytes were removed.  The anterior horn of the medial and lateral meniscus was removed.   The distal femur was opened with the drill and the intramedullary distal femoral cutting jig was utilized, set at 5 degrees resecting 8 mm off the distal femur.  Care was taken to protect the collateral ligaments.  The distal femoral sizing jig was applied, taking care to avoid notching.  Then the 4-in-1  cutting jig was applied and the anterior and posterior femur was cut, along with the chamfer cuts.  All posterior osteophytes were removed.  The flexion gap was then measured and was symmetric with the extension gap.  Then the extramedullary tibial cutting jig was utilized making the appropriate cut using the anterior tibial crest as a reference building in appropriate posterior slope.  Care was taken during the cut to protect the medial and collateral ligaments.  The proximal tibia was removed along with the posterior horns of the menisci.  The PCL was sacrificed.    The extensor gap was measured and was approximately 96mm.    I completed the distal femoral preparation using the appropriate jig to prepare the box.  The patella was then measured, and cut with the saw.    The proximal tibia sized and prepared accordingly with the reamer and the punch, and then all components were trialed with the 73mm poly insert.  The knee was found to have excellent balance and full motion.    The above named components were then cemented into place and all excess cement was removed.  The real polyethylene implant was placed.  Exparel was injected in the soft tissue  The knee was easily taken through a range of motion and the patella tracked well and the knee irrigated copiously and the parapatellar and subcutaneous tissue closed with vicryl, and monocryl with steri strips for the skin.  The wounds were injected with Exparell, and dressed with sterile gauze and the tourniquet released and the patient was awakened and returned to the PACU in stable and satisfactory condition.  There were  no complications.  Total tourniquet time was 75 minutes.   POSTOPERATIVE PLAN: post op Abx, DVT px: Ambulation, SCD's, TEDs, ASA325 daily

## 2013-04-25 NOTE — Anesthesia Procedure Notes (Addendum)
Anesthesia Regional Block:  Femoral nerve block  Pre-Anesthetic Checklist: ,, timeout performed, Correct Patient, Correct Site, Correct Laterality, Correct Procedure, Correct Position, site marked, Risks and benefits discussed, at surgeon's request and post-op pain management  Laterality: Left and Upper  Prep: chloraprep       Needles:  Injection technique: Single-shot  Needle Type: Echogenic Needle     Needle Length: 9cm 9 cm Needle Gauge: 22 and 22 G  Needle insertion depth: 6 cm   Additional Needles:  Procedures: ultrasound guided (picture in chart) and nerve stimulator Femoral nerve block  Nerve Stimulator or Paresthesia:  Response: Twitch elicited, 0.8 mA,   Additional Responses:   Narrative:  Start time: 04/25/2013 9:05 AM End time: 04/25/2013 9:23 AM Injection made incrementally with aspirations every 5 mL.  Performed by: Personally  Anesthesiologist: Bartolo Darter, MD  Additional Notes: BP cuff, EKG monitors applied. Sedation begun. Femoral artery palpated for location of nerve. After nerve location anesthetic injected incrementally, slowly , and after neg aspirations. Tolerated well.   Procedure Name: Intubation Date/Time: 04/25/2013 9:55 AM Performed by: Carola Frost Pre-anesthesia Checklist: Patient identified, Timeout performed, Emergency Drugs available, Suction available and Patient being monitored Patient Re-evaluated:Patient Re-evaluated prior to inductionOxygen Delivery Method: Circle system utilized Preoxygenation: Pre-oxygenation with 100% oxygen Intubation Type: IV induction Ventilation: Mask ventilation with difficulty Laryngoscope Size: Mac, 3, Miller and 2 Grade View: Grade IV Tube type: Oral Tube size: 7.0 mm Number of attempts: 2 Airway Equipment and Method: Stylet Placement Confirmation: positive ETCO2,  ETT inserted through vocal cords under direct vision,  CO2 detector and breath sounds checked- equal and bilateral Tube secured with:  Tape Dental Injury: Injury to tongue  Comments: DLx1 with miller 2 - grade 4 view, DLx1 with MAC 3 - slightly better visualization.  7.0 ETT inserted.  Small laceration to tongue noted post intubation.

## 2013-04-25 NOTE — Progress Notes (Signed)
Nira Conn, CRNA at bedside.

## 2013-04-25 NOTE — Interval H&P Note (Signed)
History and Physical Interval Note:  04/25/2013 7:15 AM  Ashley Nguyen  has presented today for surgery, with the diagnosis of OA LEFT KNEE  The various methods of treatment have been discussed with the patient and family. After consideration of risks, benefits and other options for treatment, the patient has consented to  Procedure(s): TOTAL KNEE ARTHROPLASTY (Left) as a surgical intervention .  The patient's history has been reviewed, patient examined, no change in status, stable for surgery.  I have reviewed the patient's chart and labs.  Questions were answered to the patient's satisfaction.     Raeshawn Vo, D

## 2013-04-25 NOTE — Progress Notes (Signed)
Orthopedic Tech Progress Note Patient Details:  Ashley Nguyen October 17, 1932 [id number] Note for cpm start is incorrect disregard.  Patient ID: Ashley Anne Huttner, female   DOB: January 17, 1933, 80 y.o.   MRN: [id number]   Braulio Bosch 04/25/2013, 8:14 PM

## 2013-04-25 NOTE — Transfer of Care (Signed)
Immediate Anesthesia Transfer of Care Note  Patient: Ashley Nguyen  Procedure(s) Performed: Procedure(s): TOTAL KNEE ARTHROPLASTY (Left)  Patient Location: PACU  Anesthesia Type:General  Level of Consciousness: awake, alert  and oriented  Airway & Oxygen Therapy: Patient Spontanous Breathing and Patient connected to nasal cannula oxygen  Post-op Assessment: Report given to PACU RN, Post -op Vital signs reviewed and stable and Patient moving all extremities X 4  Post vital signs: Reviewed and stable  Complications: No apparent anesthesia complications

## 2013-04-25 NOTE — Anesthesia Preprocedure Evaluation (Addendum)
Anesthesia Evaluation  Patient identified by MRN, date of birth, ID band Patient awake    Reviewed: Allergy & Precautions, H&P , NPO status , Patient's Chart, lab work & pertinent test results  History of Anesthesia Complications (+) PONV and history of anesthetic complications  Airway Mallampati: II  Neck ROM: Full  Mouth opening: Limited Mouth Opening  Dental  (+) Teeth Intact, Dental Advisory Given   Pulmonary  breath sounds clear to auscultation        Cardiovascular hypertension, Rhythm:Regular Rate:Normal     Neuro/Psych TIA   GI/Hepatic GERD-  ,  Endo/Other  neg diabetesMorbid obesityPt denies DM  Renal/GU      Musculoskeletal   Abdominal (+) + obese,   Peds  Hematology   Anesthesia Other Findings   Reproductive/Obstetrics                          Anesthesia Physical Anesthesia Plan  ASA: III  Anesthesia Plan: General   Post-op Pain Management:    Induction: Intravenous  Airway Management Planned: Oral ETT  Additional Equipment:   Intra-op Plan:   Post-operative Plan: Extubation in OR  Informed Consent: I have reviewed the patients History and Physical, chart, labs and discussed the procedure including the risks, benefits and alternatives for the proposed anesthesia with the patient or authorized representative who has indicated his/her understanding and acceptance.     Plan Discussed with:   Anesthesia Plan Comments:         Anesthesia Quick Evaluation

## 2013-04-25 NOTE — Progress Notes (Signed)
Orthopedic Tech Progress Note Patient Details:  Ashley Nguyen 1932/09/29 [id number] CPM applied to Left LE with appropriate settings. OHF applied to bed. Footsie roll provided. CPM Left Knee CPM Left Knee: On Left Knee Flexion (Degrees): 60 Left Knee Extension (Degrees): 0   [name] 04/25/2013, 1:54 PM

## 2013-04-25 NOTE — Progress Notes (Signed)
Orthopedic Tech Progress Note Patient Details:  Ashley Nguyen 1932-11-06 [id number] On cpm at 8:05 pm RLE 0-60 Patient ID: Ashley Nguyen, female   DOB: 1932/09/28, 80 y.o.   MRN: [id number]   Braulio Bosch 04/25/2013, 8:08 PM

## 2013-04-25 NOTE — Progress Notes (Signed)
Report to [employer] for lunch relief.

## 2013-04-26 LAB — CBC
HCT: 32.9 % — ABNORMAL LOW (ref 36.0–46.0)
Hemoglobin: 11.1 g/dL — ABNORMAL LOW (ref 12.0–15.0)
MCH: 30.9 pg (ref 26.0–34.0)
MCHC: 33.7 g/dL (ref 30.0–36.0)
MCV: 91.6 fL (ref 78.0–100.0)
Platelets: 249 10*3/uL (ref 150–400)
RBC: 3.59 MIL/uL — ABNORMAL LOW (ref 3.87–5.11)
RDW: 13.1 % (ref 11.5–15.5)
WBC: 9.6 10*3/uL (ref 4.0–10.5)

## 2013-04-26 LAB — TSH: TSH: 1.59 u[IU]/mL (ref 0.350–4.500)

## 2013-04-26 MED ORDER — DOCUSATE SODIUM 100 MG PO CAPS: 100.0000 mg | ORAL_CAPSULE | Freq: Two times a day (BID) | ORAL | Status: DC

## 2013-04-26 MED ORDER — ONDANSETRON HCL 4 MG PO TABS: 4.0000 mg | ORAL_TABLET | Freq: Three times a day (TID) | ORAL | Status: DC | PRN

## 2013-04-26 MED ORDER — ASPIRIN EC 325 MG PO TBEC: 325.0000 mg | DELAYED_RELEASE_TABLET | Freq: Every day | ORAL | Status: DC

## 2013-04-26 MED ORDER — METHOCARBAMOL 500 MG PO TABS: 500.0000 mg | ORAL_TABLET | Freq: Four times a day (QID) | ORAL | Status: DC | PRN

## 2013-04-26 MED ORDER — OXYCODONE HCL 5 MG PO TABS: 10.0000 mg | ORAL_TABLET | ORAL | Status: DC | PRN

## 2013-04-26 NOTE — Progress Notes (Signed)
  Echocardiogram 2D Echocardiogram has been performed.  Valinda Hoar 04/26/2013, 12:00 PM

## 2013-04-26 NOTE — Progress Notes (Signed)
Patient ID: Ashley Nguyen, female   DOB: 1932/02/04, 80 y.o.   MRN: [id number]     Subjective:  Patient reports pain as mild. She states that she would like to go home tomorrow if possible. Denies any CP or SOB  Objective:   VITALS:   Filed Vitals:   04/26/13 0000 04/26/13 0315 04/26/13 0419 04/26/13 0500  BP:   106/53   Pulse:   66   Temp:   98.1 F (36.7 C)   TempSrc:   Oral   Resp: 17 16 16    Height:    5\' 5"  (1.651 m)  Weight:    98.175 kg (216 lb 7 oz)  SpO2: 97% 96% 94%     ABD soft Sensation intact distally Dorsiflexion/Plantar flexion intact Incision: dressing C/D/I and no drainage   Lab Results  Component Value Date   WBC 9.6 04/26/2013   HGB 11.1* 04/26/2013   HCT 32.9* 04/26/2013   MCV 91.6 04/26/2013   PLT 249 04/26/2013     Assessment/Plan: 1 Day Post-Op   Active Problems:   Arthritis of knee   Atrial fibrillation   Hypertension   GERD (gastroesophageal reflux disease)   Hyperlipidemia   Diabetes mellitus without complication   History of GI bleed   Advance diet Up with therapy Plan for discharge tomorrow Plan for Dressing change tomorrow WBAT ABLA continue to monitor   Joya Gaskins 04/26/2013, 8:02 AM   [name] MD [phone]

## 2013-04-26 NOTE — Progress Notes (Signed)
PT Cancellation Note  Patient Details Name: Ashley Nguyen MRN: [id number] DOB: Feb 04, 1932   Cancelled Treatment:    Reason Eval/Treat Not Completed: Patient at procedure or test/unavailable. Pt off floor for testing. Will attempt eval again this afternoon.    Jolyn Lent 04/26/2013, 12:15 PM  Jolyn Lent, PT, DPT Acute Rehabilitation Services Pager: [phone]

## 2013-04-26 NOTE — Progress Notes (Signed)
Subjective:  No atrial fibrillation overnight.  BP is on the low side now.   Objective:  Vital Signs in the last 24 hours: BP 91/49  Pulse 74  Temp(Src) 98.1 F (36.7 C) (Oral)  Resp 16  Ht 5\' 5"  (1.651 m)  Wt 98.175 kg (216 lb 7 oz)  BMI 36.02 kg/m2  SpO2 94%  Physical Exam: Pleasant WF in NAD Lungs:  Clear  Cardiac:  Regular rhythm, normal S1 and S2, no S3 Abdomen:  Soft, nontender, no masses Extremities:  No edema present  Intake/Output from previous day: 04/07 0701 - 04/08 0700 In: 4039 [P.O.:760; I.V.:3179; IV Piggyback:100] Out: 1250 [Urine:500; Emesis/NG output:700; Blood:50] Weight Filed Weights   04/25/13 0836 04/26/13 0500  Weight: 98.175 kg (216 lb 7 oz) 98.175 kg (216 lb 7 oz)    Lab Results:  CBC:  Recent Labs  04/26/13 0535  WBC 9.6  HGB 11.1*  HCT 32.9*  MCV 91.6  PLT 249   Telemetry: Sinus rhythm  ECHO  Good LV function mild left atrial enlargement  Assessment/Plan:  1. Brief paroxysmal atrial fib during the surgery resolved  Rec:  OK to go home in am from cardiac viewpoint.  She should go home on metoprolol 25 BID.  Watch BP on the other medications.  I would like to see her in f/u in 4-6 weeks post surgery.    No systemic anticoagulation for the atrial fib needed now esp in light of prior GI bleed. IF recurs in future may need to reconsider.   If needed for ortho reasons that is fine.   Kerry Hough  MD [facility]  04/26/2013, 2:28 PM

## 2013-04-26 NOTE — Progress Notes (Signed)
I participated in the care of this patient and agree with the above history, physical and evaluation. I performed a review of the history and a physical exam as detailed   Ioanna Colquhoun Daniel Kristina Mcnorton MD  

## 2013-04-26 NOTE — Plan of Care (Signed)
Problem: Consults Goal: Diagnosis- Total Joint Replacement Primary Total Knee     

## 2013-04-26 NOTE — Progress Notes (Signed)
OT Cancellation Note  Patient Details Name: Ashley Nguyen MRN: [id number] DOB: [birth year]   Cancelled Treatment:    Reason Eval/Treat Not Completed: Patient declined, stating that she was exhausted from recent PT session this afternoon. Pt requested OT return tomorrow morning  Mosetta Putt, [place] [phone] 04/26/2013, 2:19 PM

## 2013-04-26 NOTE — Evaluation (Signed)
Physical Therapy Evaluation Patient Details Name: Ashley Nguyen MRN: [id number] DOB: 1932/05/16 Today's Date: 04/26/2013   History of Present Illness  Pt is a 80 y/o female admitted s/p L TKA.   Clinical Impression  This patient presents with acute pain and decreased functional independence following the above mentioned procedure. At the time of PT eval, pt was very fatigued and is limited in her mobility because of this. Pt states she is to d/c tomorrow; PT would like to see increased ambulation next session to ensure pt can ambulate car to door when she returns home. This patient is appropriate for skilled PT interventions to address functional limitations, improve safety and independence with functional mobility, and return to PLOF.    Follow Up Recommendations Home health PT;Supervision/Assistance - 24 hour    Equipment Recommendations  3in1 (PT)    Recommendations for Other Services       Precautions / Restrictions Precautions Precautions: Fall;Knee Precaution Comments: Discussed towel roll under heel and NO pillow under knee.  Restrictions Weight Bearing Restrictions: Yes LLE Weight Bearing: Weight bearing as tolerated      Mobility  Bed Mobility Overal bed mobility: Needs Assistance Bed Mobility: Supine to Sit     Supine to sit: Mod assist     General bed mobility comments: VC's for sequencing and technique. Assist for LE movement and support, as well as for trunk elevation as pt transitioned into full sitting position. Bed pad used to assist with scooting, as pt was unable to scoot hips to EOB.   Transfers Overall transfer level: Needs assistance Equipment used: Rolling walker (2 wheeled) Transfers: Sit to/from Stand Sit to Stand: Min assist         General transfer comment: VC's for hand placement on seated surface for safety. Elevated bed height for assist.   Ambulation/Gait Ambulation/Gait assistance: Min guard Ambulation Distance (Feet): 15  Feet Assistive device: Rolling walker (2 wheeled) Gait Pattern/deviations: Step-to pattern;Decreased stride length;Trunk flexed Gait velocity: Decreased Gait velocity interpretation: Below normal speed for age/gender General Gait Details: VC's for sequencing and safety awareness with the RW. Pt reports lightheadedness and is unable to ambulate farther than 15 feet.   Stairs            Wheelchair Mobility    Modified Rankin (Stroke Patients Only)       Balance Overall balance assessment: Needs assistance Sitting-balance support: Feet supported;No upper extremity supported Sitting balance-Leahy Scale: Fair     Standing balance support: Bilateral upper extremity supported Standing balance-Leahy Scale: Poor                               Pertinent Vitals/Pain Pt reports increased pain with mobility. RN notified.     Home Living Family/patient expects to be discharged to:: Private residence Living Arrangements: Spouse/significant other;Children Available Help at Discharge: Family;Available 24 hours/day Type of Home: House Home Access: Stairs to enter Entrance Stairs-Rails: Psychiatric nurse of Steps: 4 Home Layout: One level Home Equipment: Walker - 2 wheels;Toilet riser;Grab bars - toilet;Grab bars - tub/shower (Walk-in shower)      Prior Function Level of Independence: Independent with assistive device(s)               Hand Dominance   Dominant Hand: Right    Extremity/Trunk Assessment   Upper Extremity Assessment: Defer to OT evaluation           Lower Extremity Assessment: LLE deficits/detail  LLE Deficits / Details: Decreased strength and AROM consistent with TKA  Cervical / Trunk Assessment: Normal  Communication   Communication: No difficulties  Cognition Arousal/Alertness: Awake/alert Behavior During Therapy: WFL for tasks assessed/performed Overall Cognitive Status: Within Functional Limits for tasks  assessed                      General Comments      Exercises Total Joint Exercises Ankle Circles/Pumps: 10 reps Quad Sets: 10 reps Heel Slides: 10 reps Hip ABduction/ADduction: 10 reps Long Arc Quad: 10 reps Knee Flexion: 10 reps Goniometric ROM: 76 AAROM      Assessment/Plan    PT Assessment Patient needs continued PT services  PT Diagnosis Difficulty walking;Acute pain   PT Problem List Decreased strength;Decreased range of motion;Decreased activity tolerance;Decreased balance;Decreased mobility;Decreased knowledge of use of DME;Decreased safety awareness;Decreased knowledge of precautions;Pain  PT Treatment Interventions DME instruction;Gait training;Stair training;Functional mobility training;Therapeutic activities;Therapeutic exercise;Neuromuscular re-education;Patient/family education   PT Goals (Current goals can be found in the Care Plan section) Acute Rehab PT Goals Patient Stated Goal: To return home without rehab stay PT Goal Formulation: With patient Time For Goal Achievement: 05/03/13 Potential to Achieve Goals: Good    Frequency 7X/week   Barriers to discharge        Co-evaluation               End of Session Equipment Utilized During Treatment: Gait belt Activity Tolerance: Patient limited by fatigue Patient left: in chair;with call bell/phone within reach;with family/visitor present Nurse Communication: Mobility status         Time: 1335-1417 PT Time Calculation (min): 42 min   Charges:   PT Evaluation $Initial PT Evaluation Tier I: 1 Procedure PT Treatments $Gait Training: 8-22 mins $Therapeutic Exercise: 8-22 mins   PT G Codes:          Jolyn Lent 04/26/2013, 2:56 PM  Jolyn Lent, PT, DPT Acute Rehabilitation Services Pager: [phone]

## 2013-04-27 ENCOUNTER — Encounter (HOSPITAL_COMMUNITY): Payer: Self-pay | Admitting: Orthopedic Surgery

## 2013-04-27 DIAGNOSIS — I4891 Unspecified atrial fibrillation: Secondary | ICD-10-CM

## 2013-04-27 MED ORDER — ONDANSETRON HCL 4 MG PO TABS: 4.0000 mg | ORAL_TABLET | Freq: Three times a day (TID) | ORAL | Status: DC | PRN

## 2013-04-27 MED ORDER — METOPROLOL TARTRATE 25 MG PO TABS: 25.0000 mg | ORAL_TABLET | Freq: Two times a day (BID) | ORAL | Status: DC

## 2013-04-27 MED ORDER — ASPIRIN EC 325 MG PO TBEC: 325.0000 mg | DELAYED_RELEASE_TABLET | Freq: Every day | ORAL | Status: DC

## 2013-04-27 MED ORDER — OXYCODONE HCL 5 MG PO TABS: 10.0000 mg | ORAL_TABLET | ORAL | Status: DC | PRN

## 2013-04-27 MED ORDER — DOCUSATE SODIUM 100 MG PO CAPS: 100.0000 mg | ORAL_CAPSULE | Freq: Two times a day (BID) | ORAL | Status: DC

## 2013-04-27 NOTE — Progress Notes (Signed)
Occupational Therapy Evaluation and Discharge Patient Details Name: Ashley Nguyen MRN: [id number] DOB: Jul 04, 1932 Today's Date: 04/27/2013    History of Present Illness Pt is a 80 y/o female admitted s/p L TKA.    Clinical Impression   PTA pt lived at home with her husband and her daughter and son-in-law. Education and training completed regarding compensatory techniques for LB ADLs. Pt practiced getting on/off comfort height toilet with use of grab bars to prepare for home environment. Encouraged pt to consider [facility] for home use initially and pt will think about it, although she insists she will not use it. Pt with decreased pain today however continues to demonstrate decreased endurance. Pt reports her husband and other family will be available to assist with ADLs 24/7. No further acute OT needs. Acute OT to sign off.    Follow Up Recommendations  No OT follow up;Supervision/Assistance - 24 hour    Equipment Recommendations  3 in 1 bedside comode (discussed use of BSC and pt is considering whether she will use it)       Precautions / Restrictions Precautions Precautions: Fall;Knee Required Braces or Orthoses: Knee Immobilizer - Left (in room, no orders for KI; used for OOB during therapy) Knee Immobilizer - Left: Other (comment) (no orders in chart; used for therapy when OOB) Restrictions Weight Bearing Restrictions: Yes LLE Weight Bearing: Weight bearing as tolerated      Mobility Bed Mobility               General bed mobility comments: Pt received in recliner for OT session.   Transfers Overall transfer level: Needs assistance Equipment used: Rolling walker (2 wheeled) Transfers: Sit to/from Stand Sit to Stand: Min assist         General transfer comment: VC's for hand placement on seated surface. Min A to power up until pt able to get both hands on walker. Able to steady self once hands on RW  Comment: Pt ambulated into hallway and back to recliner in  room with decreased pain and increased strength today. Informed PT of pt's progress.          ADL Overall ADL's : Needs assistance/impaired Eating/Feeding: Independent;Sitting   Grooming: Set up;Standing   Upper Body Bathing: Set up;Sitting   Lower Body Bathing: Minimal assistance;Sit to/from stand   Upper Body Dressing : Set up;Sitting   Lower Body Dressing: Moderate assistance;Sit to/from stand   Toilet Transfer: Minimal assistance;Comfort height toilet;Ambulation;RW;Grab bars   Toileting- Clothing Manipulation and Hygiene: Min guard;Sit to/from stand (with RW)   Tub/ Shower Transfer: Min guard;Rolling walker;Ambulation;Walk-in shower   Functional mobility during ADLs: Min guard;Rolling walker                 Pertinent Vitals/Pain No c/o pain today     Hand Dominance Right   Extremity/Trunk Assessment Upper Extremity Assessment Upper Extremity Assessment: Overall WFL for tasks assessed   Lower Extremity Assessment Lower Extremity Assessment: Defer to PT evaluation   Cervical / Trunk Assessment Cervical / Trunk Assessment: Normal   Communication Communication Communication: No difficulties   Cognition Arousal/Alertness: Awake/alert Behavior During Therapy: WFL for tasks assessed/performed Overall Cognitive Status: Within Functional Limits for tasks assessed                                Home Living Family/patient expects to be discharged to:: Private residence Living Arrangements: Spouse/significant other;Children Available Help at Discharge: Family;Available 24 hours/day  Type of Home: House Home Access: Stairs to enter [employer] of Steps: 4 Entrance Stairs-Rails: Right;Left Home Layout: One level     Bathroom Shower/Tub: Occupational psychologist: Handicapped height     Home Equipment: Walker - 2 wheels;Grab bars - toilet;Grab bars - tub/shower;Other (comment) (comfort height toilet)          Prior  Functioning/Environment Level of Independence: Independent with assistive device(s)                                       End of Session Equipment Utilized During Treatment: Gait belt;Rolling walker;Left knee immobilizer CPM Left Knee CPM Left Knee: Off  Activity Tolerance: Patient tolerated treatment well Patient left: in chair;with call bell/phone within reach;with family/visitor present   Time: 0925-0958 OT Time Calculation (min): 33 min Charges:  OT General Charges $OT Visit: 1 Procedure OT Evaluation $Initial OT Evaluation Tier I: 1 Procedure OT Treatments $Self Care/Home Management : 23-37 mins  Juluis Rainier [phone] 04/27/2013, 10:07 AM

## 2013-04-27 NOTE — Discharge Instructions (Signed)
Take Metoprolol and stop your HCTZ/lisinopril  Take ASA 325 daily for 30 days, add time to that per the cardiologist if needed.

## 2013-04-27 NOTE — Care Management Note (Signed)
CARE MANAGEMENT NOTE 04/27/2013  Patient:  VALMA, METZGER   Account Number:  [id number]  Date Initiated:  04/26/2013  Documentation initiated by:  Ricki Miller  Subjective/Objective Assessment:   80 yr old female s/p left total knee arthroplasty.     Action/Plan:   Case manager spoke with patient concerning [place] and DME needs.Patient preoperatively setup with [facility], no changes.DME has been delivered. Has family support at discharge.   Anticipated DC Date:  04/28/2013   Anticipated DC Plan:  [place]  CM consult      [facility] Choice  HOME HEALTH  DURABLE MEDICAL EQUIPMENT   Choice offered to / List presented to:  C-1 Patient   DME arranged  3-N-1  [place]      DME agency  [employer]     HH arranged  HH-2 PT      [place].   Status of service:  Completed, signed off

## 2013-04-27 NOTE — Progress Notes (Signed)
Patient: Ashley Nguyen / Admit Date: 04/25/2013 / Date of Encounter: 04/27/2013, 11:39 AM  Subjective  No complaints. Feels well.  She refuses to have her BP checked on her upper arm so it's been checked on her wrist, which may be slightly lowering results. Denies any symptoms of low blood pressure.  Objective   Telemetry: NSR  Physical Exam: Blood pressure 93/47, pulse 72, temperature 98.4 F (36.9 C), temperature source Oral, resp. rate 18, height 5\' 5"  (1.651 m), weight 216 lb 7 oz (98.175 kg), SpO2 97.00%. General: Well developed, well nourished WF in no acute distress Head: Normocephalic, atraumatic, sclera non-icteric, no xanthomas, nares are without discharge. Neck: Negative for carotid bruits. JVP not elevated. Lungs: Clear bilaterally to auscultation without wheezes, rales, or rhonchi. Breathing is unlabored. Heart: RRR S1 S2 without murmurs, rubs, or gallops.  Abdomen: Soft, non-tender, non-distended with normoactive bowel sounds. No rebound/guarding. Extremities: No clubbing or cyanosis. No edema. Distal pedal pulses are 2+ and equal bilaterally. Neuro: Alert and oriented X 3. Psych:  Responds to questions appropriately with a normal affect.   Intake/Output Summary (Last 24 hours) at 04/27/13 1139 Last data filed at 04/27/13 0650  Gross per 24 hour  Intake    720 ml  Output      0 ml  Net    720 ml    Inpatient Medications:  . aspirin EC  325 mg Oral Q breakfast  . cycloSPORINE  1 drop Both Eyes BID  . docusate sodium  100 mg Oral BID  . fluticasone  1 spray Each Nare Daily  . lisinopril  20 mg Oral Daily   And  . hydrochlorothiazide  25 mg Oral Daily  . metoprolol tartrate  25 mg Oral BID  . pantoprazole  40 mg Oral Daily   Infusions:  . lactated ringers 20 mL/hr at 04/25/13 0902    Labs: No results found for this basename: NA, K, CL, CO2, GLUCOSE, BUN, CREATININE, CALCIUM, MG, PHOS,  in the last 72 hours No results found for this basename: AST, ALT,  ALKPHOS, BILITOT, PROT, ALBUMIN,  in the last 72 hours  Recent Labs  04/26/13 0535  WBC 9.6  HGB 11.1*  HCT 32.9*  MCV 91.6  PLT 249   No results found for this basename: CKTOTAL, CKMB, TROPONINI,  in the last 72 hours No components found with this basename: POCBNP,  No results found for this basename: HGBA1C,  in the last 72 hours   Radiology/Studies:  Dg Chest 2 View  04/17/2013   CLINICAL DATA:  Hypertension.  Preop for knee arthroplasty.  EXAM: CHEST  2 VIEW  COMPARISON:  None.  FINDINGS: Cardiac silhouette is normal in size. The aorta is tortuous. No mediastinal or hilar masses. Lungs are clear. No pleural effusion. No pneumothorax.  Bony thorax is demineralized but intact.  IMPRESSION: No active cardiopulmonary disease.   Electronically Signed   By: Lajean Manes M.D.   On: 04/17/2013 14:09   Dg Knee 1-2 Views Left  04/25/2013   CLINICAL DATA:  Knee replacement.  EXAM: LEFT KNEE - 1-2 VIEW  COMPARISON:  Plain films of the left knee 10/26/2012.  FINDINGS: The patient has a new left total knee arthroplasty. The device is located and there is no fracture. Gas in the soft tissue from surgery is identified.  IMPRESSION: Left total knee replacement.  No acute abnormality.   Electronically Signed   By: Inge Rise M.D.   On: 04/25/2013 14:49   2D  Echo 04/26/13: mild LVH, EF 55-60%, no RMWA, mildly calcified mitral annulus, mild-mod dilated LA   Assessment and Plan  1. Perioperative atrial fibrillation that was transient likely due to the stress of anesthesia  2. Osteoarthritis s/p TKA POD #2 3. Obesity Body mass index is 36.02 kg/(m^2). 4. Hypertension with softer BP's this adm 5. Diabetes mellitus  6. History of GI bleeding while on Plavix  7. History of TIA  Recommend to continue beta blocker at discharge. Lisinopril HCTZ should probably be held at discharge since she has not been getting this as an inpatient due to low BP (see above regarding method of checking). This may be in  part due to narcotics. If BP continues to be an issue, can lower metoprolol to 12.5mg  BID. Dr. Wynonia Lawman plans to see her 4-6 weeks post-surgery. No systemic anticoagulation for the atrial fib needed now esp in light of prior GI bleed. If recurs in future, may need to reconsider given h/o TIA. If needed for ortho reasons that is fine. Have sent EPIC message to Dr. Wynonia Lawman to ensure f/u has been arranged.   Signed, Melina Copa PA-C

## 2013-04-27 NOTE — Progress Notes (Signed)
Physical Therapy Treatment Patient Details Name: Ashley Nguyen MRN: [id number] DOB: March 18, 1932 Today's Date: 04/27/2013    History of Present Illness Pt is a 80 y/o female admitted s/p L TKA.     PT Comments    Pt progressing towards physical therapy goals. Was able to negotiate 5 steps ascending and descending, utilizing sideways technique. Pt anticipates d/c tomorrow morning. Would like to see pt for further stair training prior to d/c.    Follow Up Recommendations  Home health PT;Supervision/Assistance - 24 hour     Equipment Recommendations  3in1 (PT)    Recommendations for Other Services       Precautions / Restrictions Precautions Precautions: Fall;Knee Precaution Comments: Discussed towel roll under heel and NO pillow under knee.  Knee Immobilizer - Left: Other (comment) (No orders for knee immobilizer, does well without it) Restrictions Weight Bearing Restrictions: Yes LLE Weight Bearing: Weight bearing as tolerated    Mobility  Bed Mobility Overal bed mobility: Needs Assistance Bed Mobility: Supine to Sit     Supine to sit: Min assist     General bed mobility comments: Pt required increased assist for scooting hips to EOB. Bed pad utilized to assist.   Transfers Overall transfer level: Needs assistance Equipment used: Rolling walker (2 wheeled) Transfers: Sit to/from Stand Sit to Stand: Min guard         General transfer comment: Increased time to achieve full standing, however demonstrated proper hand placement and safety awareness.   Ambulation/Gait Ambulation/Gait assistance: Min guard Ambulation Distance (Feet): 45 Feet Assistive device: Rolling walker (2 wheeled) Gait Pattern/deviations: Step-to pattern;Decreased stride length;Trunk flexed Gait velocity: Decreased Gait velocity interpretation: Below normal speed for age/gender General Gait Details: VC's for sequencing and safety awareness with the RW. Specific cueing for improved  posture, step-through gait pattern, and increased heel strike. Overall, pt moving very slow with ambulation this afternoon.    Stairs Stairs: Yes Stairs assistance: Min guard Stair Management: One rail Right;One rail Left;Sideways Number of Stairs: 5 General stair comments: VC's for sequencing and safety awareness.   Wheelchair Mobility    Modified Rankin (Stroke Patients Only)       Balance Overall balance assessment: Needs assistance Sitting-balance support: Feet supported;No upper extremity supported Sitting balance-Leahy Scale: Fair     Standing balance support: Bilateral upper extremity supported Standing balance-Leahy Scale: Poor Standing balance comment: Pt requires UE support to maintain balance.                    Cognition Arousal/Alertness: Awake/alert Behavior During Therapy: WFL for tasks assessed/performed Overall Cognitive Status: Within Functional Limits for tasks assessed                      Exercises Total Joint Exercises Ankle Circles/Pumps: 10 reps Quad Sets: 10 reps Heel Slides: 10 reps Hip ABduction/ADduction: 10 reps    General Comments        Pertinent Vitals/Pain Pt reports 6/10 pain throughout session and states the time in which she can have more medication. RN notified.     Home Living                      Prior Function            PT Goals (current goals can now be found in the care plan section) Acute Rehab PT Goals Patient Stated Goal: To return home without rehab stay PT Goal Formulation: With patient Time For Goal  Achievement: 05/03/13 Potential to Achieve Goals: Good Progress towards PT goals: Progressing toward goals    Frequency  7X/week    PT Plan Current plan remains appropriate    Co-evaluation             End of Session Equipment Utilized During Treatment: Gait belt Activity Tolerance: Patient tolerated treatment well Patient left: in chair;with call bell/phone within  reach;with family/visitor present     Time: [id number] PT Time Calculation (min): 35 min  Charges:  $Gait Training: 8-22 mins $Therapeutic Exercise: 8-22 mins                    G Codes:      Jolyn Lent 15-May-2013, 4:44 PM  Jolyn Lent, PT, DPT Acute Rehabilitation Services Pager: [phone]

## 2013-04-27 NOTE — Progress Notes (Signed)
Physical Therapy Treatment Patient Details Name: Ashley Nguyen MRN: [id number] DOB: 1932-04-05 Today's Date: 04/27/2013    History of Present Illness Pt is a 80 y/o female admitted s/p L TKA.     PT Comments    Pt progressing towards physical therapy goals. Pt and husband report they would be more comfortable with d/c tomorrow vs. this afternoon from a safety and mobility standpoint. RN present during this conversation. Pt demonstrating improved ambulation distance and technique, however continues to require VC's for increased heel strike and step-through gait pattern. Will initiate step-training in afternoon session if pt can tolerate.   Follow Up Recommendations  Home health PT;Supervision/Assistance - 24 hour     Equipment Recommendations  3in1 (PT)    Recommendations for Other Services       Precautions / Restrictions Precautions Precautions: Fall;Knee Precaution Comments: Discussed towel roll under heel and NO pillow under knee.  Required Braces or Orthoses: Knee Immobilizer - Left (in room, no orders for KI; used for OOB during therapy) Knee Immobilizer - Left: Other (comment) (No orders for knee immobilizer, does well without it) Restrictions Weight Bearing Restrictions: Yes LLE Weight Bearing: Weight bearing as tolerated    Mobility  Bed Mobility               General bed mobility comments: Pt received in recliner for PT session.   Transfers Overall transfer level: Needs assistance Equipment used: Rolling walker (2 wheeled) Transfers: Sit to/from Stand Sit to Stand: Min guard         General transfer comment: Increased time to achieve full standing, however demonstrated proper hand placement and safety awareness.   Ambulation/Gait Ambulation/Gait assistance: Min guard Ambulation Distance (Feet): 45 Feet Assistive device: Rolling walker (2 wheeled) Gait Pattern/deviations: Step-to pattern;Step-through pattern;Decreased stride length;Trunk  flexed Gait velocity: Decreased Gait velocity interpretation: Below normal speed for age/gender General Gait Details: VC's for sequencing and safety awareness with the RW. Specific cueing for improved posture, step-through gait pattern, and increased heel strike.    Stairs            Wheelchair Mobility    Modified Rankin (Stroke Patients Only)       Balance Overall balance assessment: Needs assistance Sitting-balance support: Feet supported;No upper extremity supported Sitting balance-Leahy Scale: Fair     Standing balance support: Bilateral upper extremity supported Standing balance-Leahy Scale: Poor Standing balance comment: Pt requires UE support to maintain balance.                     Cognition Arousal/Alertness: Awake/alert Behavior During Therapy: WFL for tasks assessed/performed Overall Cognitive Status: Within Functional Limits for tasks assessed                      Exercises Total Joint Exercises Ankle Circles/Pumps: 10 reps Quad Sets: 10 reps Short Arc Quad: 10 reps Heel Slides: 10 reps Hip ABduction/ADduction: 10 reps Straight Leg Raises: 10 reps Knee Flexion: 10 reps Goniometric ROM: 79 AAROM    General Comments        Pertinent Vitals/Pain Pt reports 3/10 pain at rest.     Home Living Family/patient expects to be discharged to:: Private residence Living Arrangements: Spouse/significant other;Children Available Help at Discharge: Family;Available 24 hours/day Type of Home: House Home Access: Stairs to enter Entrance Stairs-Rails: Right;Left Home Layout: One level Home Equipment: Walker - 2 wheels;Grab bars - toilet;Grab bars - tub/shower;Other (comment) (comfort height toilet)      Prior Function  Level of Independence: Independent with assistive device(s)          PT Goals (current goals can now be found in the care plan section) Acute Rehab PT Goals Patient Stated Goal: To return home without rehab stay PT Goal  Formulation: With patient Time For Goal Achievement: 05/03/13 Potential to Achieve Goals: Good Progress towards PT goals: Progressing toward goals    Frequency  7X/week    PT Plan Current plan remains appropriate    Co-evaluation             End of Session Equipment Utilized During Treatment: Gait belt Activity Tolerance: Patient tolerated treatment well Patient left: in chair;with call bell/phone within reach;with family/visitor present     Time: 1022-1055 PT Time Calculation (min): 33 min  Charges:  $Gait Training: 8-22 mins $Therapeutic Exercise: 8-22 mins                    G Codes:      Jolyn Lent 04-30-2013, 11:03 AM  Jolyn Lent, PT, DPT Acute Rehabilitation Services Pager: [phone]

## 2013-04-27 NOTE — Progress Notes (Signed)
I participated in the care of this patient and agree with the above history, physical and evaluation. I performed a review of the history and a physical exam as detailed   Trenese Haft Daniel Yazmine Sorey MD  

## 2013-04-27 NOTE — Discharge Summary (Signed)
Physician Discharge Summary  Patient ID: Ashley Nguyen MRN: [id number] DOB/AGE: [id number] 78 y.o.  Admit date: 04/25/2013 Discharge date: 04/27/2013  Admission Diagnoses:  <principal problem not specified>  Discharge Diagnoses:  Active Problems:   Arthritis of knee   Atrial fibrillation   Hypertension   GERD (gastroesophageal reflux disease)   Hyperlipidemia   Diabetes mellitus without complication   History of GI bleed   Past Medical History  Diagnosis Date  . Hypertension   . GERD (gastroesophageal reflux disease)   . Hyperlipidemia   . Cataracts, bilateral   . Impaired hearing   . TIA (transient ischemic attack) 89yrs ago  . Seasonal allergies   . History of migraine     last time well over a month ago  . Vertigo   . History of gastric ulcer   . History of GI bleed   . History of colon polyps   . History of kidney stones   . Anemia     at times has been on iron supplements  . Diabetes mellitus without complication     borderline    Surgeries: Procedure(s): TOTAL KNEE ARTHROPLASTY on 04/25/2013   Consultants (if any): Treatment Team:  Jacolyn Reedy, MD  Discharged Condition: Improved  Hospital Course: Ashley Nguyen is an 80 y.o. female who was admitted 04/25/2013 with a diagnosis of <principal problem not specified> and went to the operating room on 04/25/2013 and underwent the above named procedures.    She was given perioperative antibiotics:  Anti-infectives   Start     Dose/Rate Route Frequency Ordered Stop   04/25/13 1600  ceFAZolin (ANCEF) IVPB 2 g/50 mL premix     2 g 100 mL/hr over 30 Minutes Intravenous Every 6 hours 04/25/13 1425 04/25/13 2330   04/25/13 0600  ceFAZolin (ANCEF) IVPB 2 g/50 mL premix     2 g 100 mL/hr over 30 Minutes Intravenous On call to O.R. 04/24/13 1402 04/25/13 1002    .  She was given sequential compression devices, early ambulation, and ASA 325 daily for DVT prophylaxis.  She benefited maximally from  the hospital stay and she had some intraop AF and this has been managed by cardiology.    Recent vital signs:  Filed Vitals:   04/27/13 0924  BP: 93/47  Pulse: 72  Temp:   Resp:     Recent laboratory studies:  Lab Results  Component Value Date   HGB 11.1* 04/26/2013   HGB 14.9 04/17/2013   Lab Results  Component Value Date   WBC 9.6 04/26/2013   PLT 249 04/26/2013   Lab Results  Component Value Date   INR 0.97 04/17/2013   Lab Results  Component Value Date   NA 136* 04/17/2013   K 4.1 04/17/2013   CL 96 04/17/2013   CO2 24 04/17/2013   BUN 19 04/17/2013   CREATININE 1.04 04/17/2013   GLUCOSE 102* 04/17/2013    Discharge Medications:     Medication List    TAKE these medications       aspirin EC 325 MG tablet  Take 1 tablet (325 mg total) by mouth daily.     docusate sodium 100 MG capsule  Commonly known as:  COLACE  Take 1 capsule (100 mg total) by mouth 2 (two) times daily. Continue this while taking narcotics to help with bowel movements     methocarbamol 500 MG tablet  Commonly known as:  ROBAXIN  Take 1 tablet (500 mg total) by mouth every  6 (six) hours as needed.     ondansetron 4 MG tablet  Commonly known as:  ZOFRAN  Take 1 tablet (4 mg total) by mouth every 8 (eight) hours as needed for nausea.     oxyCODONE 5 MG immediate release tablet  Commonly known as:  ROXICODONE  Take 2 tablets (10 mg total) by mouth every 4 (four) hours as needed for severe pain.      ASK your doctor about these medications       celecoxib 200 MG capsule  Commonly known as:  CELEBREX  Take 200 mg by mouth daily.     colesevelam 625 MG tablet  Commonly known as:  WELCHOL  Take 625 mg by mouth 2 (two) times daily with a meal.     cycloSPORINE 0.05 % ophthalmic emulsion  Commonly known as:  RESTASIS  Place 1 drop into both eyes 2 (two) times daily.     esomeprazole 40 MG capsule  Commonly known as:  NEXIUM  Take 40 mg by mouth daily.     fluticasone 50 MCG/ACT nasal spray   Commonly known as:  FLONASE  Place 1 spray into the nose daily.     lisinopril-hydrochlorothiazide 20-25 MG per tablet  Commonly known as:  PRINZIDE,ZESTORETIC  Take 1 tablet by mouth daily.        Diagnostic Studies: Dg Chest 2 View  04/17/2013   CLINICAL DATA:  Hypertension.  Preop for knee arthroplasty.  EXAM: CHEST  2 VIEW  COMPARISON:  None.  FINDINGS: Cardiac silhouette is normal in size. The aorta is tortuous. No mediastinal or hilar masses. Lungs are clear. No pleural effusion. No pneumothorax.  Bony thorax is demineralized but intact.  IMPRESSION: No active cardiopulmonary disease.   Electronically Signed   By: Lajean Manes M.D.   On: 04/17/2013 14:09   Dg Knee 1-2 Views Left  04/25/2013   CLINICAL DATA:  Knee replacement.  EXAM: LEFT KNEE - 1-2 VIEW  COMPARISON:  Plain films of the left knee 10/26/2012.  FINDINGS: The patient has a new left total knee arthroplasty. The device is located and there is no fracture. Gas in the soft tissue from surgery is identified.  IMPRESSION: Left total knee replacement.  No acute abnormality.   Electronically Signed   By: Inge Rise M.D.   On: 04/25/2013 14:49    Disposition: Final discharge disposition not confirmed        Follow-up Information   Follow up with TILLEY JR,W SPENCER, MD. Schedule an appointment as soon as possible for a visit in 6 weeks.   Specialty:  Cardiology   Contact information:   [address] [place] [address] [phone]       Follow up with Renette Butters, MD. (as scheduled)    Specialty:  Orthopedic Surgery   Contact information:   [address] [place] [address] [phone]        Signed: Renette Butters 04/27/2013, 2:44 PM

## 2013-04-27 NOTE — Progress Notes (Signed)
Patient ID: Ashley Nguyen, female   DOB: [birth year], 80 y.o.   MRN: [id number]     Subjective:  Patient reports pain as mild to moderate.  Patient states that she is much better than yesterday denies any CP or SOB  Objective:   VITALS:   Filed Vitals:   04/26/13 2038 04/27/13 0000 04/27/13 0400 04/27/13 0625  BP: 129/65   120/70  Pulse: 84   85  Temp: 99 F (37.2 C)   98.4 F (36.9 C)  TempSrc: Oral   Oral  Resp: 18 17 17 18   Height:      Weight:      SpO2: 94%   97%    ABD soft Sensation intact distally Dorsiflexion/Plantar flexion intact Incision: dressing C/D/I and no drainage Wound clean and dry no sign of infection   Lab Results  Component Value Date   WBC 9.6 04/26/2013   HGB 11.1* 04/26/2013   HCT 32.9* 04/26/2013   MCV 91.6 04/26/2013   PLT 249 04/26/2013     Assessment/Plan: 2 Days Post-Op   Active Problems:   Arthritis of knee   Atrial fibrillation   Hypertension   GERD (gastroesophageal reflux disease)   Hyperlipidemia   Diabetes mellitus without complication   History of GI bleed   Advance diet Up with therapy Plan for discharge tomorrow Patient may go home today if passes PT or will plan for DC tomorrow   Joya Gaskins 04/27/2013, 7:42 AM   [name] MD [phone]

## 2013-04-27 NOTE — Progress Notes (Signed)
    Subjective:  No chest pain, palps.  Objective:  Vital Signs in the last 24 hours: Temp:  [97.5 F (36.4 C)-99 F (37.2 C)] 98.4 F (36.9 C) (04/09 0625) Pulse Rate:  [72-85] 72 (04/09 0924) Resp:  [16-18] 18 (04/09 0625) BP: (93-129)/(47-70) 93/47 mmHg (04/09 0924) SpO2:  [93 %-97 %] 97 % (04/09 0625)  Intake/Output from previous day: 04/08 0701 - 04/09 0700 In: 960 [P.O.:960] Out: -   Physical Exam: Pt is alert and oriented, NAD   Lab Results:  Recent Labs  04/26/13 0535  WBC 9.6  HGB 11.1*  PLT 249   No results found for this basename: NA, K, CL, CO2, GLUCOSE, BUN, CREATININE,  in the last 72 hours No results found for this basename: TROPONINI, CK, MB,  in the last 72 hours  Tele: Sinus rhythm, episodes of sinus tach. No further AF seen.  Assessment/Plan:  Post-op AF. Plans as outlined by Dr Wynonia Lawman. I do not see any further AF on her tele. Please call if any further CV issues arise while she is here in the hospital. She will call Dr Thurman Coyer office next week to arrange follow-up. thx   Sherren Mocha, M.D. 04/27/2013, 11:56 AM

## 2013-04-27 NOTE — Progress Notes (Signed)
Agree with note as outlines. See my separate note. thx  Sherren Mocha 04/27/2013 12:27 PM

## 2013-04-28 ENCOUNTER — Encounter (HOSPITAL_COMMUNITY): Payer: Self-pay | Admitting: Nurse Practitioner

## 2013-04-28 DIAGNOSIS — I480 Paroxysmal atrial fibrillation: Secondary | ICD-10-CM

## 2013-04-28 MED ORDER — METOPROLOL TARTRATE 50 MG PO TABS
50.0000 mg | ORAL_TABLET | Freq: Two times a day (BID) | ORAL | Status: DC
  Filled 2013-04-28: qty 1

## 2013-04-28 MED ORDER — METOPROLOL TARTRATE 25 MG PO TABS
25.0000 mg | ORAL_TABLET | Freq: Once | ORAL | Status: AC
  Administered 2013-04-28: 25 mg via ORAL
  Filled 2013-04-28: qty 1

## 2013-04-28 MED ORDER — METOPROLOL TARTRATE 50 MG PO TABS: 50.0000 mg | ORAL_TABLET | Freq: Two times a day (BID) | ORAL | Status: DC

## 2013-04-28 MED ORDER — METOPROLOL TARTRATE 25 MG PO TABS
25.0000 mg | ORAL_TABLET | Freq: Once | ORAL | Status: DC
  Filled 2013-04-28: qty 1

## 2013-04-28 NOTE — Progress Notes (Signed)
Pt went into afib when she got up to BR at 2140, rate 120-140, asymptomatic. 2200 dose of Lopressor PO given.  DR Burt Knack advised and order for extra 25 mg PO Lopressor to be given. BP 135/55, still asymptomatic.

## 2013-04-28 NOTE — Progress Notes (Signed)
Patient Name: Ashley Nguyen Date of Encounter: 04/28/2013   Principal Problem:   Arthritis of knee Active Problems:   PAF (paroxysmal atrial fibrillation)   Hypertension   Diabetes mellitus without complication   GERD (gastroesophageal reflux disease)   Hyperlipidemia   History of GI bleed    SUBJECTIVE  No chest pain, sob, or palpitations.  She went into afib last night for ~ 2.5 hrs between 9:30p and 12a.  She was given lopressor 25mg  x 1 and tolerated this well, converting to sinus.  She was asymptomatic throughout the episode of afib, despite rates into the 140's.  CURRENT MEDS . aspirin EC  325 mg Oral Q breakfast  . cycloSPORINE  1 drop Both Eyes BID  . docusate sodium  100 mg Oral BID  . fluticasone  1 spray Each Nare Daily  . metoprolol tartrate  25 mg Oral BID  . metoprolol tartrate  25 mg Oral Once  . pantoprazole  40 mg Oral Daily    OBJECTIVE  Filed Vitals:   04/27/13 2139 04/28/13 0000 04/28/13 0400 04/28/13 0521  BP: 105/84   119/63  Pulse: 120   69  Temp: 98.8 F (37.1 C)   97.8 F (36.6 C)  TempSrc: Oral   Oral  Resp: 18 17 17 18   Height:      Weight:      SpO2: 97%   97%    Intake/Output Summary (Last 24 hours) at 04/28/13 1015 Last data filed at 04/28/13 0751  Gross per 24 hour  Intake    240 ml  Output      0 ml  Net    240 ml   Filed Weights   04/25/13 0836 04/26/13 0500  Weight: 216 lb 7 oz (98.175 kg) 216 lb 7 oz (98.175 kg)    PHYSICAL EXAM  General: Pleasant, NAD. Neuro: Alert and oriented X 3. Moves all extremities spontaneously. Psych: Normal affect. HEENT:  Normal  Neck: Supple without bruits or JVD. Lungs:  Resp regular and unlabored, CTA. Heart: RRR no s3, s4, or murmurs. Abdomen: Soft, non-tender, non-distended, BS + x 4.  Extremities: No clubbing, cyanosis.  The left knee dsg is d/i.  The left lower leg has trace to 1+ edema. DP/PT/Radials 1+ and equal bilaterally.  Accessory Clinical  Findings  CBC  Recent Labs  04/26/13 0535  WBC 9.6  HGB 11.1*  HCT 32.9*  MCV 91.6  PLT 249   Thyroid Function Tests  Recent Labs  04/25/13 2336  TSH 1.590   TELE  rsr->coarse afib up to 140's between 9:30p and 12a->sinus.  ASSESSMENT AND PLAN  1.  OA L Knee: s/p L TKA on 4/7.  Per ortho.  She will have home health.  2.  PAF:  Recurrent afib last night - asymptomatic.  She tolerated additional BB.  BP's stable (taken from left wrist).  Will titrate lopressor to 50mg  BID.  CHA2DS2VASc = 4-5 (HTN, Age/Age, DM, h/o TIA) however she is only being treated with ASA in the setting of prior GIB.  If she has recurrent afib, she will likely require antiarrhythmic therapy.  In the absence of CAD, flecainide may be a reasonable option.  Nl EF by echo.  3.  HTN: stable.  ACEI/diuretic combo d/c'd in favor of bb in setting of #2.  4.  DM: diet controlled.  5.  Dispo:  F/u Dr. Wynonia Lawman as an outpt in 2-4 wks.  Signed, Rogelia Mire NP  Patient seen, examined.  Available data reviewed. Agree with findings, assessment, and plan as outlined by Ignacia Bayley, NP. The patient was independently examined and evaluated. Telemetry reveals sinus rhythm. She last had atrial fibrillation around midnight. I agree with the plans as outlined above. The patient will followup with Dr. Wynonia Lawman as an outpatient.  Sherren Mocha, M.D. 04/28/2013 5:36 PM

## 2013-04-28 NOTE — Progress Notes (Signed)
Pt back in NSR

## 2013-04-28 NOTE — Progress Notes (Signed)
Physical Therapy Treatment Patient Details Name: Ashley Nguyen MRN: [id number] DOB: 06-16-1932 Today's Date: 04/28/2013    History of Present Illness Pt is a 80 y/o female admitted s/p L TKA.     PT Comments    Pt progressing towards physical therapy goals. Was able to negotiate 5 steps utilizing sideways technique, and improved ambulation distance this session. Pt continues to move slowly overall, however states no concerns with safety when returning home. Pt and husband anticipate d/c home today. Will continue to follow.   Follow Up Recommendations  Home health PT;Supervision/Assistance - 24 hour     Equipment Recommendations  3in1 (PT)    Recommendations for Other Services       Precautions / Restrictions Precautions Precautions: Fall;Knee Precaution Comments: Discussed towel roll under heel and NO pillow under knee.  Restrictions Weight Bearing Restrictions: Yes LLE Weight Bearing: Weight bearing as tolerated    Mobility  Bed Mobility               General bed mobility comments: Pt received sitting EOB with husband present in room.   Transfers Overall transfer level: Needs assistance Equipment used: Rolling walker (2 wheeled) Transfers: Sit to/from Stand Sit to Stand: Min assist         General transfer comment: Multiple attempts to achieve full standing from lowered bed, and min assist given for power-up.   Ambulation/Gait Ambulation/Gait assistance: Min guard Ambulation Distance (Feet): 75 Feet Assistive device: Rolling walker (2 wheeled) Gait Pattern/deviations: Step-to pattern;Step-through pattern;Decreased stride length;Trunk flexed Gait velocity: Decreased Gait velocity interpretation: Below normal speed for age/gender General Gait Details: VC's for sequencing and safety awareness with the RW. Specific cueing for improved posture, step-through gait pattern, and increased heel strike. Overall, pt moving very slow with  ambulation.   Stairs Stairs: Yes Stairs assistance: Min guard Stair Management: One rail Right;One rail Left;Sideways Number of Stairs: 5 General stair comments: VC's for sequencing and safety awareness.   Wheelchair Mobility    Modified Rankin (Stroke Patients Only)       Balance Overall balance assessment: Needs assistance Sitting-balance support: Feet supported;No upper extremity supported Sitting balance-Leahy Scale: Fair     Standing balance support: Bilateral upper extremity supported Standing balance-Leahy Scale: Poor                      Cognition Arousal/Alertness: Awake/alert Behavior During Therapy: WFL for tasks assessed/performed Overall Cognitive Status: Within Functional Limits for tasks assessed                      Exercises Total Joint Exercises Quad Sets: 10 reps Heel Slides: 10 reps Hip ABduction/ADduction: 10 reps Long Arc Quad: 10 reps Knee Flexion: 10 reps Goniometric ROM: 80 AAROM    General Comments        Pertinent Vitals/Pain Pt reports increased pain in hip, stating it feels like it's "catching" during therapeutic exercise. Ice pack applied and pt positioned in a reclined position. RN aware.     Home Living                      Prior Function            PT Goals (current goals can now be found in the care plan section) Acute Rehab PT Goals Patient Stated Goal: To return home without rehab stay PT Goal Formulation: With patient Time For Goal Achievement: 05/03/13 Potential to Achieve Goals: Good Progress towards PT  goals: Progressing toward goals    Frequency  7X/week    PT Plan Current plan remains appropriate    Co-evaluation             End of Session Equipment Utilized During Treatment: Gait belt Activity Tolerance: Patient tolerated treatment well Patient left: in chair;with call bell/phone within reach;with family/visitor present     Time: [id number] PT Time Calculation (min):  46 min  Charges:  $Gait Training: 23-37 mins $Therapeutic Exercise: 8-22 mins                    G Codes:      Jolyn Lent 05-02-2013, 11:22 AM  Jolyn Lent, PT, DPT Acute Rehabilitation Services Pager: [phone]

## 2013-05-01 ENCOUNTER — Telehealth: Payer: Self-pay | Admitting: Cardiology

## 2013-05-01 NOTE — Telephone Encounter (Signed)
OK 

## 2013-05-05 ENCOUNTER — Telehealth: Payer: Self-pay | Admitting: Cardiology

## 2013-05-07 NOTE — Telephone Encounter (Signed)
We will see her in followup.  Kerry Hough MD [facility]

## 2013-05-19 ENCOUNTER — Ambulatory Visit (HOSPITAL_COMMUNITY)
Admission: RE | Admit: 2013-05-19 | Discharge: 2013-05-19 | Disposition: A | Discharge status: Home / Self Care | Payer: Medicare Other | Source: Ambulatory Visit | Attending: Orthopedic Surgery | Admitting: Orthopedic Surgery

## 2013-05-19 DIAGNOSIS — IMO0001 Reserved for inherently not codable concepts without codable children: Secondary | ICD-10-CM | POA: Diagnosis not present

## 2013-05-19 DIAGNOSIS — M25569 Pain in unspecified knee: Secondary | ICD-10-CM | POA: Diagnosis not present

## 2013-05-19 DIAGNOSIS — M25669 Stiffness of unspecified knee, not elsewhere classified: Secondary | ICD-10-CM | POA: Diagnosis not present

## 2013-05-19 DIAGNOSIS — I10 Essential (primary) hypertension: Secondary | ICD-10-CM | POA: Insufficient documentation

## 2013-05-19 DIAGNOSIS — E119 Type 2 diabetes mellitus without complications: Secondary | ICD-10-CM | POA: Insufficient documentation

## 2013-05-19 NOTE — Evaluation (Addendum)
Physical Therapy Evaluation  Patient Details  Name: Ashley Nguyen MRN: [id number] Date of Birth: [birth year]  Today's Date: 05/19/2013 Time: [id number] PT Time Calculation (min): 35 min   Charges: 1 Evaluation, manual therapy [id number]           Visit#: 1 of 24  Re-eval: 06/18/13 Assessment Diagnosis: Lt TKA Surgical Date: 04/25/13 Next MD Visit: 06/11/13 Prior Therapy: home health PT  Authorization: Medicare    Authorization Visit#: 1 of 24   Past Medical History:  Past Medical History  Diagnosis Date  . Hypertension   . GERD (gastroesophageal reflux disease)   . Hyperlipidemia   . Cataracts, bilateral   . Impaired hearing   . TIA (transient ischemic attack) 58yrs ago  . Seasonal allergies   . History of migraine     last time well over a month ago  . Vertigo   . History of gastric ulcer   . History of GI bleed   . History of colon polyps   . History of kidney stones   . Anemia     at times has been on iron supplements  . Diabetes mellitus without complication     borderline  . PAF (paroxysmal atrial fibrillation)     a. 04/2013 periop L TKA.  . Osteoarthritis     a. 04/2013 s/p L TKA.   Past Surgical History:  Past Surgical History  Procedure Laterality Date  . Tonsillectomy      adenoidectomy  . Tubal ligation    . Knee arthroscopy Left   . Thumb surgery Right   . Appendectomy      as a child  . Esophagogastroduodenoscopy    . Colonoscopy    . Total knee arthroplasty Left 04/25/2013    Procedure: TOTAL KNEE ARTHROPLASTY;  Surgeon: Renette Butters, MD;  Location: [place];  Service: Orthopedics;  Laterality: Left;    Subjective Symptoms/Limitations Symptoms: patient reports she has been really drowsy because of the medication (pain), Lt knee pain 4/10, Rt knee pain (1/10). Patient stated being cold, but this is normal. minor numbness and tingling alon incision site, mild discoloration along incision. Pertinent History: 04/25/13 Lt TKA, patient and MD  unaware of what heart disorder patient or may not have and is currently wearign a heart monitor to track knee.  How long can you sit comfortably?: knee begins throbbing after 30-45 minutes How long can you stand comfortably?: Patient is able to toelrate stanidng for <6min How long can you walk comfortably?: Patient is able to tolerate walking for <4minutes Repetition: Increases Symptoms Patient Stated Goals: Patient wants to be able to sit for 3 hours, want to be able to walk intermittently for 4 hours, patient wants to be able to ambulate up and down stairs reciprocally.  Pain Assessment Currently in Pain?: Yes Pain Score: 4  Pain Location: Knee Pain Orientation: Left Pain Type: Surgical pain Pain Onset: 1 to 4 weeks ago Pain Frequency: Occasional Pain Relieving Factors: Elevation, movign it, PROM,   Precautions/Restrictions  Restrictions Weight Bearing Restrictions: Yes LLE Weight Bearing: Weight bearing as tolerated  Prior [place] expects to be discharged to:: Private residence Living Arrangements: Spouse/significant other;Children  Cognition/Observation Cognition Overall Cognitive Status: Within Functional Limits for tasks assessed  Sensation/Coordination/Flexibility/Functional Tests Functional Tests Functional Tests: gait: patient ambualtes with increased toe out, decreased weight shift to Lt Leg, increased knee valgus  Assessment LLE AROM (degrees) Left Knee Extension: -10 Left Knee Flexion: 91 LLE Strength Left Hip Flexion:  4/5 Left Knee Flexion: 4/5 Left Knee Extension:  (4+/5) Left Ankle Dorsiflexion: 4/5  Exercise/Treatments Supine Quad Sets: 10 reps;AROM;Left Heel Slides: 10 reps;AROM;Left  Manual Therapy Manual Therapy: Myofascial release Myofascial Release: Retro massage for edema, gastroc, quad   Physical Therapy Assessment and Plan PT Assessment and Plan Clinical Impression Statement: Patient dispalsy significant  Lt knee mobility restrictions, following Lt TKA. According to patient it had previously been moving better than it is now. Following retro massage to decrease edema patient stated decreased pain. Patient also responded well to initial exercises with slight improvement in AROM Pt will benefit from skilled therapeutic intervention in order to improve on the following deficits: Abnormal gait;Decreased activity tolerance;Decreased balance;Difficulty walking;Decreased strength;Decreased skin integrity;Decreased range of motion;Decreased mobility;Increased fascial restricitons;Increased muscle spasms;Impaired flexibility;Pain Rehab Potential: Good PT Frequency: Min 3X/week PT Duration: 8 weeks PT Treatment/Interventions: Gait training;Stair training;Therapeutic activities;Therapeutic exercise;Balance training;Manual techniques;Patient/family education PT Plan: Initial focus on progressing AROM utilizing stretching calf, quad, hamstring, and knee AROM. Progress weight bearing and loading as tolerated, Utilize manual techniques as neccessary to augment therapy. Prgress strength as ROM improves.     Goals Home Exercise Program Pt/caregiver will Perform Home Exercise Program: For increased ROM;For increased strengthening PT Goal: Perform Home Exercise Program - Progress: Goal set today PT Short Term Goals Time to Complete Short Term Goals: 4 weeks PT Short Term Goal 1: knee flexion extension to improve to 5-105 degrees to increase stride length PT Short Term Goal 2: Patient to be able to sleep through night without waking >2 times per night due to pain.  PT Short Term Goal 3: Patient hip abduction strength to improve to 4/5 so patient can singkle leg stand >3 seconds without trendelenberg.  PT Long Term Goals Time to Complete Long Term Goals: 8 weeks PT Long Term Goal 1: Patient will be able to sit for 3 hours without knee pain so she can attend church PT Long Term Goal 2: Patient will be able to walk for  5 hours to go shopping with daughter Long Term Goal 3: Patient will be able to stand >2 hours to perform all hiome cooking needs.  Long Term Goal 4: Patient will be able to ambulate up and down stairs with reciprocal gait for two flights of stairs to see daughter.  PT Long Term Goal 5: knee flexion extension to improve to 0-120 degrees to increase stride length  Problem List Patient Active Problem List   Diagnosis Date Noted  . PAF (paroxysmal atrial fibrillation) 04/28/2013  . Arthritis of knee 04/25/2013  . Atrial fibrillation 04/25/2013  . History of GI bleed 04/25/2013  . Hypertension   . GERD (gastroesophageal reflux disease)   . Hyperlipidemia   . Diabetes mellitus without complication     PT - End of Session Activity Tolerance: Treatment limited secondary to medical complications (Comment) General Behavior During Therapy: [facility] for tasks assessed/performed PT Plan of Care PT Home Exercise Plan: quad sets, and heel slides.   GP Functional Assessment Tool Used: FOTO Status: 48%, Limitation: 52% Functional Limitation: Mobility: Walking and moving around Mobility: Walking and Moving Around Current Status [id number]): At least 40 percent but less than 60 percent impaired, limited or restricted Mobility: Walking and Moving Around Goal Status [phone]): At least 20 percent but less than 40 percent impaired, limited or restricted  Revonda Menter R Wilda Wetherell 05/19/2013, 4:03 PM  Physician Documentation Your signature is required to indicate approval of the treatment plan as stated above.  Please sign and either send electronically  or make a copy of this report for your files and return this physician signed original.   Please mark one 1.__approve of plan  2. ___approve of plan with the following conditions.   ______________________________                                                          _____________________ Physician Signature                                                                                                              Date

## 2013-05-25 ENCOUNTER — Ambulatory Visit (HOSPITAL_COMMUNITY)
Admission: RE | Admit: 2013-05-25 | Discharge: 2013-05-25 | Disposition: A | Discharge status: Home / Self Care | Payer: Medicare Other | Source: Ambulatory Visit | Attending: Orthopedic Surgery | Admitting: Orthopedic Surgery

## 2013-05-25 DIAGNOSIS — IMO0001 Reserved for inherently not codable concepts without codable children: Secondary | ICD-10-CM | POA: Diagnosis not present

## 2013-05-25 NOTE — Progress Notes (Signed)
Physical Therapy Treatment Patient Details  Name: Ashley Nguyen MRN: [id number] Date of Birth: [birth year]  Today's Date: 05/25/2013 Time: 1012-1102 PT Time Calculation (min): 50 min Charge: TE 1012-1020; 1035-1100, Manual 1020-1035  Visit#: 2 of 24  Re-eval: 06/18/13 Assessment Diagnosis: Lt TKA Surgical Date: 04/25/13 Next MD Visit: 06/11/13 Prior Therapy: home health PT  Authorization: Medicare  Authorization Time Period:    Authorization Visit#:   of     Subjective: Symptoms/Limitations Symptoms: Pt reports compliance with HEP, reports she is going to call MD abput current pain medicatin to assure she is taking correct medication. Lt knee pain scale 5/10 Pain Assessment Currently in Pain?: Yes Pain Score: 5  Pain Location: Knee Pain Orientation: Left  Precautions/Restrictions  Restrictions Weight Bearing Restrictions: Yes LLE Weight Bearing: Weight bearing as tolerated  Exercise/Treatments Stretches Active Hamstring Stretch: 2 reps;30 seconds Quad Stretch: 2 reps;30 seconds;Limitations Quad Stretch Limitations: standing with manual assistance Hip Flexor Stretch: 2 reps;30 seconds;Limitations Hip Flexor Stretch Limitations: standing on 8 in step Gastroc Stretch: 2 reps;30 seconds Standing Gait Training: Gait training for heel to toe pattern, equalize stride length and stance phase Supine Quad Sets: 10 reps;AROM;Left Heel Slides: 10 reps;AROM;Left  Manual Therapy Myofascial Release: Retro massage for edema control with elevation, STM to quadriceps to reduce tightness  Physical Therapy Assessment and Plan PT Assessment and Plan Clinical Impression Statement: Began POC focusing on ROM.  Began reciprocal bicycle seat 10.  Added stretches to improve hamstrings, gastroc, quad and hip flexor flexibilty.  Noted decreased edema following retro massage with elevation and ankle pumps.  Pt encouraged to wear her compresson hose to assist wtih edema.  Cueing with gait  training to equalize stance phase and stride length. PT Plan: Continue current POC focusing on AROM initially.  Continues stretches to calf, quad, hamstrings and knee AROM.  Improve quad strength for knee extension.  Begin 3D hip excursion and rockerboard to improve weight bearing with gait.  Progress strength as ROM improves.      Goals Home Exercise Program Pt/caregiver will Perform Home Exercise Program: For increased ROM;For increased strengthening PT Short Term Goals Time to Complete Short Term Goals: 4 weeks PT Short Term Goal 1: knee flexion extension to improve to 5-105 degrees to increase stride length PT Short Term Goal 1 - Progress: Progressing toward goal PT Short Term Goal 2: Patient to be able to sleep through night without waking >2 times per night due to pain.  PT Short Term Goal 3: Patient hip abduction strength to improve to 4/5 so patient can singkle leg stand >3 seconds without trendelenberg.  PT Long Term Goals Time to Complete Long Term Goals: 8 weeks PT Long Term Goal 1: Patient will be able to sit for 3 hours without knee pain so she can attend church PT Long Term Goal 2: Patient will be able to walk for 5 hours to go shopping with daughter Long Term Goal 3: Patient will be able to stand >2 hours to perform all hiome cooking needs.  Long Term Goal 4: Patient will be able to ambulate up and down stairs with reciprocal gait for two flights of stairs to see daughter.  PT Long Term Goal 5: knee flexion extension to improve to 0-120 degrees to increase stride length  Problem List Patient Active Problem List   Diagnosis Date Noted  . PAF (paroxysmal atrial fibrillation) 04/28/2013  . Arthritis of knee 04/25/2013  . Atrial fibrillation 04/25/2013  . History of GI bleed 04/25/2013  .  Hypertension   . GERD (gastroesophageal reflux disease)   . Hyperlipidemia   . Diabetes mellitus without complication     PT - End of Session Activity Tolerance: Patient tolerated  treatment well General Behavior During Therapy: [facility] for tasks assessed/performed  GP    Aldona Lento 05/25/2013, 12:06 PM

## 2013-05-29 ENCOUNTER — Ambulatory Visit (HOSPITAL_COMMUNITY)
Admission: RE | Admit: 2013-05-29 | Discharge: 2013-05-29 | Disposition: A | Discharge status: Home / Self Care | Payer: Medicare Other | Source: Ambulatory Visit | Attending: Orthopedic Surgery | Admitting: Orthopedic Surgery

## 2013-05-29 DIAGNOSIS — IMO0001 Reserved for inherently not codable concepts without codable children: Secondary | ICD-10-CM | POA: Diagnosis not present

## 2013-05-29 NOTE — Progress Notes (Signed)
Physical Therapy Treatment Patient Details  Name: Ashley Nguyen MRN: [id number] Date of Birth: 09-21-1932  Today's Date: 05/29/2013 Time: 1025-1123 PT Time Calculation (min): 33 min Charge: Manual 1030-1050, TE [id number]  Visit#: 3 of 24  Re-eval: 06/18/13 Assessment Diagnosis: Lt TKA Surgical Date: 04/25/13 Next MD Visit: [name] 06/07/2013 Prior Therapy: home health PT  Authorization: Medicare  Authorization Time Period:    Authorization Visit#: 3 of 24   Subjective: Symptoms/Limitations Symptoms: Pt stated she woke up screaming last night, did something in bed and demonstrated increased antalgic gait mechanics at entrance into dept.  Pain scale 5/10 with pain medication.  Pt reported she attended church for first time following surgery. Pain Assessment Currently in Pain?: Yes Pain Score: 5  Pain Location: Knee Pain Orientation: Left  Precautions/Restrictions  Restrictions Weight Bearing Restrictions: Yes LLE Weight Bearing: Weight bearing as tolerated  Exercise/Treatments Stretches Active Hamstring Stretch: 3 reps;30 seconds;Limitations Active Hamstring Stretch Limitations: with rope Quad Stretch: 2 reps;30 seconds;Limitations Quad Stretch Limitations: manual supine Gastroc Stretch: 5 reps;30 seconds;Limitations Gastroc Stretch Limitations: 3 way with slant board Standing Rocker Board: 2 minutes;Limitations Rocker Board Limitations: R/L Gait Training: Gait training with focus on equalized stance phase and stride length, push off with heel to toe pattern Other Standing Knee Exercises: 3D hip excursion 10 reps Seated Heel Slides: Left;5 reps   Manual Therapy Manual Therapy: Myofascial release Myofascial Release: MFR around incision perimeter, retro massage wtih elevation for edema control, STM to quadriceps to reduce tightness  Physical Therapy Assessment and Plan PT Assessment and Plan Clinical Impression Statement: Pt with increased antalgic  gait due to pain and increase swelling following last night began session with manual techniques to reduce edema, MFR to reduce fascial restrictions around incision and STM to quad and medial hamstrings to reduce  tightness with vast improvement in edema reduction.  Session focus on improving gait mechanics wtih cueing to improve weight distribution for equalized stance and stride length.  Added 3D hip excursion and rockerboard R/L to improve hip mobitliy and weight bearing wtih gait.  Pt encouraged to wear her compression hose and apply ice to knee at home for pain and edema control.  Pt reported pain reduce to 2/10 at end of session. PT Plan: Continue current POC main focus on AROM.  Continue stretches to improve LE flexibilty to increase AROM.  Progress quad strength for knee extension and progress functional strengthening as ROM improves.    Goals Home Exercise Program Pt/caregiver will Perform Home Exercise Program: For increased ROM;For increased strengthening PT Short Term Goals Time to Complete Short Term Goals: 4 weeks PT Short Term Goal 1: knee flexion extension to improve to 5-105 degrees to increase stride length PT Short Term Goal 1 - Progress: Progressing toward goal PT Short Term Goal 2: Patient to be able to sleep through night without waking >2 times per night due to pain.  PT Short Term Goal 3: Patient hip abduction strength to improve to 4/5 so patient can singkle leg stand >3 seconds without trendelenberg.  PT Long Term Goals Time to Complete Long Term Goals: 8 weeks PT Long Term Goal 1: Patient will be able to sit for 3 hours without knee pain so she can attend church PT Long Term Goal 1 - Progress: Progressing toward goal PT Long Term Goal 2: Patient will be able to walk for 5 hours to go shopping with daughter Long Term Goal 3: Patient will be able to stand >2 hours to perform all  hiome cooking needs.  Long Term Goal 4: Patient will be able to ambulate up and down stairs with  reciprocal gait for two flights of stairs to see daughter.  PT Long Term Goal 5: knee flexion extension to improve to 0-120 degrees to increase stride length  Problem List Patient Active Problem List   Diagnosis Date Noted  . PAF (paroxysmal atrial fibrillation) 04/28/2013  . Arthritis of knee 04/25/2013  . Atrial fibrillation 04/25/2013  . History of GI bleed 04/25/2013  . Hypertension   . GERD (gastroesophageal reflux disease)   . Hyperlipidemia   . Diabetes mellitus without complication     PT - End of Session Activity Tolerance: Patient limited by pain;Patient tolerated treatment well;Patient limited by fatigue General Behavior During Therapy: [facility] for tasks assessed/performed  GP    Aldona Lento 05/29/2013, 11:40 AM

## 2013-05-31 ENCOUNTER — Ambulatory Visit (HOSPITAL_COMMUNITY)
Admission: RE | Admit: 2013-05-31 | Discharge: 2013-05-31 | Disposition: A | Discharge status: Home / Self Care | Payer: Medicare Other | Source: Ambulatory Visit | Attending: Orthopedic Surgery | Admitting: Orthopedic Surgery

## 2013-05-31 DIAGNOSIS — IMO0001 Reserved for inherently not codable concepts without codable children: Secondary | ICD-10-CM | POA: Diagnosis not present

## 2013-05-31 NOTE — Progress Notes (Signed)
Physical Therapy Treatment Patient Details  Name: Ashley Nguyen MRN: [id number] Date of Birth: [birth year]  Today's Date: 05/31/2013 Time: 1020-1105 PT Time Calculation (min): 45 min  Visit#: 4 of 24  Re-eval: 06/18/13 Authorization: Medicare  Authorization Visit#: 4 of 24  Charges:  therex 1020-1048 (28'), manual 1049-1104 9 (15')  Subjective: Symptoms/Limitations Symptoms: Pt states she took a muscle relaxer this morning and feels a little "loopy".  Currently with 2/10 pain. Pain Assessment Currently in Pain?: Yes Pain Score: 2  Pain Location: Knee Pain Orientation: Left   Exercise/Treatments Stretches Active Hamstring Stretch: 3 reps;30 seconds;Limitations Active Hamstring Stretch Limitations: with rope Gastroc Stretch: 3 reps;30 seconds Standing Heel Raises: 10 reps;Limitations Heel Raises Limitations: toeraises 10 reps Seated Heel Slides: Left;10 reps Other Seated Knee Exercises: seated thoractc 3D excursion Supine Quad Sets: 10 reps;AROM;Left Other Supine Knee Exercises: AROM: -2 to 100    Manual Therapy Manual Therapy: Myofascial release Myofascial Release: MFR around incision perimeter, retro massage wtih elevation for edema control, STM to quadriceps to reduce tightness  Physical Therapy Assessment and Plan PT Assessment and Plan Clinical Impression Statement: Pt with increased stability today due to taking muscle relaxer prior to session.  Held majority of standing exercises and focused more on ROM and manual techniques today.  Noted scar tissue perimeter of knee but able to decrease restrictions with manual.  AROM today -2 to 100 degrees (was -10 to 80).  Added postural exercises today (thoracic excursions) to improve overall gait quality.  No pain reported at end of session. PT Plan: Continue current POC main focus on AROM.  Continue stretches to improve LE flexibilty to increase AROM.  Progress quad strength for knee extension and progress functional  strengthening as ROM improves.     Problem List Patient Active Problem List   Diagnosis Date Noted  . PAF (paroxysmal atrial fibrillation) 04/28/2013  . Arthritis of knee 04/25/2013  . Atrial fibrillation 04/25/2013  . History of GI bleed 04/25/2013  . Hypertension   . GERD (gastroesophageal reflux disease)   . Hyperlipidemia   . Diabetes mellitus without complication     PT - End of Session Activity Tolerance: Patient limited by pain;Patient tolerated treatment well;Patient limited by fatigue General Behavior During Therapy: [facility] for tasks assessed/performed   Teena Irani, PTA/CLT 05/31/2013, 12:21 PM

## 2013-06-02 ENCOUNTER — Telehealth: Payer: Self-pay | Admitting: Cardiology

## 2013-06-02 NOTE — Telephone Encounter (Signed)
Notified by telemetry service of brief asymptomatic second deg type II heart block. Called patient. Asymptomatic. Recommended she discontinue metoprolol though she was unwilling to. Agreeable to 25 bid. Monitoring to continue. Understands indications to seek emergent care (syncope, pre-syncope, SOB, chest pain, severe fatigue). Has appt with Wynonia Lawman on 4/25. Will notify office to see if can be seen earlier.

## 2013-06-05 ENCOUNTER — Ambulatory Visit (HOSPITAL_COMMUNITY)
Admission: RE | Admit: 2013-06-05 | Discharge: 2013-06-05 | Disposition: A | Discharge status: Home / Self Care | Payer: Medicare Other | Source: Ambulatory Visit | Attending: Orthopedic Surgery | Admitting: Orthopedic Surgery

## 2013-06-05 DIAGNOSIS — IMO0001 Reserved for inherently not codable concepts without codable children: Secondary | ICD-10-CM | POA: Diagnosis not present

## 2013-06-05 NOTE — Progress Notes (Addendum)
Physical Therapy Treatment Patient Details  Name: Ashley Nguyen MRN: [id number] Date of Birth: 11-30-1932  Today's Date: 06/05/2013 Time: [id number] PT Time Calculation (min): 46 min Charge: TE 1018-1050, ROM Measurement 1050-1054, Manual 1054-1104  Visit#: 5 of 24  Re-eval: 06/18/13    Authorization: Medicare  Authorization Time Period:    Authorization Visit#: 5 of 24   Subjective:    Precautions/Restrictions  Restrictions Weight Bearing Restrictions: Yes LLE Weight Bearing: Weight bearing as tolerated   06/05/13 1000  Assessment  Diagnosis Lt TKA  Surgical Date 04/25/13  Next MD Visit [name] 06/07/2013  Prior Therapy home health PT  Restrictions  Weight Bearing Restrictions Yes  LLE Weight Bearing WBAT  LLE AROM (degrees)  Left Knee Extension -2  Left Knee Flexion 103     Exercise/Treatments  06/05/13 1000  Knee/Hip Exercises: Aerobic  Stationary Bike 8' seat 9 @ 1.0 for ROM  Knee/Hip Exercises: Standing  Rocker Board 2 minutes;Limitations  Rocker Board Limitations R/L  Gait Training Gait training with focus on equalized stance phase and stride length, push off with heel to toe pattern  Other Standing Knee Exercises 3D hip excursion 10 reps  Other Standing Knee Exercises Hip abduction with heel against wall Bil LE 2sets 10x  Knee/Hip Exercises: Supine  Quad Sets 10 reps;AROM;Left  Heel Slides 10 reps;AROM;Left  Terminal Knee Extension Left;10 reps;Limitations  Terminal Knee Extension Limitations 5" holds with to  Other Supine Knee Exercises AROM: -2 to 103 PROM 0-110     Manual Therapy Manual Therapy: Myofascial release Myofascial Release: MFR around incision perimeter, retro massage wtih elevation for edema control, STM to quadriceps to reduce tightness;  Kinesio taping for edema control  Physical Therapy Assessment and Plan PT Assessment and Plan Clinical Impression Statement: Pt progressing well towards goals.  Improved AROM -2-103  with PROM 0-110.  Added wall exercises to improve glut med activation and reduce compensation with vc-ing for form and technique.  Manual techniques complete to reduce edema with retro massage with elevation and to reduce fascial restrictions perimeter of knee.  Added kinesio taping for edema control.  Pt plans to apply ice at home for pain and edema control.  MD apt scheduled on 06/07/2013, progress note sent to MD. PT Plan: F/U with MD on Wednesday, 20th.  Continue current POC main focus on AROM.  Continue stretches to improve LE flexibilty to increase AROM.  Progress quad strength for knee extension and progress functional strengthening as ROM improves.    Goals    Problem List Patient Active Problem List   Diagnosis Date Noted  . PAF (paroxysmal atrial fibrillation) 04/28/2013  . Arthritis of knee 04/25/2013  . Atrial fibrillation 04/25/2013  . History of GI bleed 04/25/2013  . Hypertension   . GERD (gastroesophageal reflux disease)   . Hyperlipidemia   . Diabetes mellitus without complication     PT - End of Session Activity Tolerance: Patient tolerated treatment well;Patient limited by fatigue General Behavior During Therapy: [facility] for tasks assessed/performed  GP    Aldona Lento 06/05/2013, 3:30 PM

## 2013-06-07 ENCOUNTER — Ambulatory Visit (HOSPITAL_COMMUNITY): Payer: Medicare Other | Admitting: Physical Therapy

## 2013-06-09 ENCOUNTER — Ambulatory Visit (HOSPITAL_COMMUNITY)
Admission: RE | Admit: 2013-06-09 | Discharge: 2013-06-09 | Disposition: A | Discharge status: Home / Self Care | Payer: Medicare Other | Source: Ambulatory Visit | Attending: Family Medicine | Admitting: Family Medicine

## 2013-06-09 DIAGNOSIS — IMO0001 Reserved for inherently not codable concepts without codable children: Secondary | ICD-10-CM | POA: Diagnosis not present

## 2013-06-09 NOTE — Progress Notes (Signed)
Physical Therapy Treatment Patient Details  Name: [place] Ucci MRN: [id number] Date of Birth: 1932/08/19  Today's Date: 06/09/2013 Time: 1013-1103 PT Time Calculation (min): 50 min Charge: TE 1013-1050, Manual 1050-1103  Visit#: 6 of 24  Re-eval: 06/18/13 Assessment Diagnosis: Lt TKA Surgical Date: 04/25/13 Next MD Visit: [name] 6 weeks Prior Therapy: home health PT  Authorization: Medicare  Authorization Time Period:    Authorization Visit#: 6 of 10   Subjective: Symptoms/Limitations Symptoms: Pt stated MD happy with progress, pt stated she is sore and stiff today believes related to rain yesterday.   Pain Assessment Currently in Pain?: Yes Pain Score: 4  Pain Location: Knee Pain Orientation: Left  Precautions/Restrictions  Restrictions Weight Bearing Restrictions: Yes LLE Weight Bearing: Weight bearing as tolerated  Exercise/Treatments Stretches Active Hamstring Stretch: 3 reps;30 seconds;Limitations Active Hamstring Stretch Limitations: with rope Gastroc Stretch: 3 reps;30 seconds Gastroc Stretch Limitations: slant board Aerobic Stationary Bike: 8' seat 9 @ 1.0 for ROM Standing Heel Raises: 15 reps;Limitations Heel Raises Limitations: toeraises 15 reps Knee Flexion: Left;15 reps Terminal Knee Extension: Left;10 reps;Theraband Theraband Level (Terminal Knee Extension): Level 4 (Blue) Gait Training: Gait training with focus on equalized stance phase and stride length, push off with heel to toe pattern; utilized SPC Bil UE to improve arm swing with gait  Other Standing Knee Exercises: 3D hip excursion 10 reps   Manual Therapy Manual Therapy: Myofascial release Myofascial Release: MFR around incision perimeter, retro massage wtih elevation for edema control, STM to quadriceps to reduce tightness  Physical Therapy Assessment and Plan PT Assessment and Plan Clinical Impression Statement: Pt progressing well towards gaals.  Began session with  bicycle and hip mobility exercises to reduce tightness and improve gait mechanics.  Utilized SPC in both hands to improve arm swing with opposite LE to improve gait mechanics and assist wtih balance, increased gait velocaity and improved gait mechanics following training with SPC.  AROM improved -1to 106, PROM 0-110.  Manual techniques complete at end of session for edema control with retro massage with elevation and MFR to perimeter of incision of knee.  Pt plans to apply ice at home. PT Plan: Continue current POC main focus on AROM.  Continue stretches to improve LE flexibilty to increase AROM.  Progress quad strength for knee extension and progress functional strengthening as ROM improves.  Progress standing exercises next session to SFT squats and begin lateral step ups    Goals Home Exercise Program Pt/caregiver will Perform Home Exercise Program: For increased ROM;For increased strengthening PT Short Term Goals Time to Complete Short Term Goals: 4 weeks PT Short Term Goal 1: knee flexion extension to improve to 5-105 degrees to increase stride length PT Short Term Goal 1 - Progress: Met PT Short Term Goal 2: Patient to be able to sleep through night without waking >2 times per night due to pain.  PT Short Term Goal 3: Patient hip abduction strength to improve to 4/5 so patient can singkle leg stand >3 seconds without trendelenberg.  PT Long Term Goals Time to Complete Long Term Goals: 8 weeks PT Long Term Goal 1: Patient will be able to sit for 3 hours without knee pain so she can attend church PT Long Term Goal 2: Patient will be able to walk for 5 hours to go shopping with daughter PT Long Term Goal 2 - Progress: Progressing toward goal Long Term Goal 3: Patient will be able to stand >2 hours to perform all hiome cooking needs.  Long  Term Goal 4: Patient will be able to ambulate up and down stairs with reciprocal gait for two flights of stairs to see daughter.  PT Long Term Goal 5: knee  flexion extension to improve to 0-120 degrees to increase stride length  Problem List Patient Active Problem List   Diagnosis Date Noted  . PAF (paroxysmal atrial fibrillation) 04/28/2013  . Arthritis of knee 04/25/2013  . Atrial fibrillation 04/25/2013  . History of GI bleed 04/25/2013  . Hypertension   . GERD (gastroesophageal reflux disease)   . Hyperlipidemia   . Diabetes mellitus without complication     PT - End of Session Activity Tolerance: Patient tolerated treatment well General Behavior During Therapy: [facility] for tasks assessed/performed  GP    Aldona Lento 06/09/2013, 11:15 AM

## 2013-06-13 ENCOUNTER — Encounter: Payer: Self-pay | Admitting: Cardiology

## 2013-06-13 NOTE — Progress Notes (Signed)
Patient ID: [place] Anne Bourassa, female   DOB: 1932/03/01, 80 y.o.   MRN: [id number]   Jurgens, [place] A  Date of visit:  06/13/2013 DOB:  [birth year]    Age:  80 yrs. Medical record number:  [id number]     Account number:  [id number] Primary Care Provider: Lynne Logan ____________________________ CURRENT DIAGNOSES  1. Arryhthmia-Atrial fib  2. Obesity(BMI30-40)  3. Hypertension,Essential (Benign)  4. Personal history of TIA or stroke without residua ____________________________ ALLERGIES  Lipitor, Muscle aches  Penicillins, Rash  Plavix, Bleeding (non-specific)  Shellfish Derived, Intolerance-unknown  Sulfa (Sulfonamide Antibiotics), Rash ____________________________ MEDICATIONS  1. WelChol 625 mg tablet, BID  2. Restasis 0.05 % eye drops in a dropperette, ou bid  3. Nexium 40 mg capsule,delayed release, 1 p.o. daily  4. Flonase Allergy Relief 50 mcg/actuation nasal spray,suspension, qd  5. metoprolol tartrate 50 mg tablet, BID  6. lisinopril 20 mg-hydrochlorothiazide 25 mg tablet, 1 p.o. daily  7. Xarelto 20 mg tablet, 1 p.o. daily  8. metoprolol tartrate 25 mg tablet, BID ____________________________ CHIEF COMPLAINTS  F/u p event monitor  Followup of Arryhthmia-Atrial fib ____________________________ HISTORY OF PRESENT ILLNESS  Patient seen for cardiac followup. She has worn a cardiac event monitor the patient episodes of atrial fibrillation. In addition she had an episode of 2-1 heart block that occurred while she was sitting in a chair reading the paper at around 6:30 one night. The effective heart rate was only 29. At that point her metoprolol was reduced by the on-call person to 25 mg twice daily. She has now finished wearing the event monitor. She has recovered from her knee surgery well. She does have a CHADS2VASC score of 5. She has been using Celebrex as well as meloxicam for generalized arthritis. She doesn't have any shortness of breath. She has not been taking her  antihypertensive medications and reports also that her primary physician who is retired and she is in the process of seeking somebody else. ____________________________ PAST HISTORY  Past Medical Illnesses:  GERD, hypertension, migraine headaches, vertigo, history of GI bleed, obesity, history of TIA;  Cardiovascular Illnesses:  atrial fibrillation-paroxysmal;  Surgical Procedures:  knee replacement, appendectomy, tonsillectomy/adenoids, tubal ligation, thumb surgery;  Cardiology Procedures-Invasive:  no history of prior cardiac procedures;  Cardiology Procedures-Noninvasive:  echocardiogram April 2015, event monitor April 2015;  LVEF of 60% documented via echocardiogram on 04/26/2013,   ____________________________ CARDIO-PULMONARY TEST DATES EKG Date:  05/16/2013;  Holter/Event Monitor Date: 05/17/2013;  Echocardiography Date: 04/26/2013;   ____________________________ FAMILY HISTORY Brother -- Brother dead, Bowel cancer Father -- Father dead, CVA Mother -- Mother dead, Neoplasm of uterus Sister -- Sister alive and well ____________________________ SOCIAL HISTORY Alcohol Use:  no alcohol use;  Smoking:  never smoked;  Diet:  regular diet;  Lifestyle:  married;  Exercise:  exercise is limited due to physical disability;  Occupation:  retired Actuary;  Residence:  lives with husband;   ____________________________ REVIEW OF SYSTEMS General:  obesity Eyes: wears eye glasses/contact lenses, cataracts, dry eyes Ears, Nose, Throat, Mouth:  denies any hearing loss, epistaxis, hoarseness or difficulty speaking. Respiratory: denies dyspnea, cough, wheezing or hemoptysis. Cardiovascular:  please review HPI Abdominal: denies dyspepsia, GI bleeding, constipation, or diarrheaGenitourinary-Female: hesitancy Musculoskeletal:  see HPI Neurological:  denies headaches, stroke, or TIA  ____________________________ PHYSICAL EXAMINATION VITAL SIGNS  Blood Pressure:  154/92 Sitting, Right arm, large cuff   , 160/90 Standing, Right arm and large cuff   Pulse:  78/min. Weight:  211.00 lbs. Height:  66"BMI:  34  Constitutional:  pleasant white female, in no acute distress, moderately obese walks with cane Skin:  warm and dry to touch, no apparent skin lesions, or masses noted. Head:  normocephalic, normal hair pattern, no masses or tenderness Neck:  supple, without massess. No JVD, thyromegaly or carotid bruits. Carotid upstroke normal. Chest:  normal symmetry, clear to auscultation. Cardiac:  regular rhythm, normal S1 and S2, No S3 or S4, no murmurs, gallops or rubs detected. Peripheral Pulses:  the femoral,dorsalis pedis, and posterior tibial pulses are full and equal bilaterally with no bruits auscultated. Extremities & Back:  Healed scar from knee surgery ____________________________ IMPRESSIONS/PLAN  1. Recurrent paroxysmal atrial fibrillation with a CHADS2VASC score of 5 2. Hypertension 3. Obesity 4. History of GI bleeding on Plavix 5. Episode of 2-1 heart block not occurring during sleep  Recommendations:  Long discussion with patient. Recommended the use of anticoagulation although she is at risk for bleeding because of prior bleeding as well as the use of nonsteroidal anti-inflammatories. I asked her to stop using these and to continue Prevacid and started her on Xarelto 20 mg daily. I would like for her to have an electrophysiology consultation about the 2:1 heart block as well as the presence of tachybradycardia syndrome. Restart her lisinopril/HCTZ. ____________________________ TODAYS ORDERS  1.  Consult [facility] EP: First available  2. Return Visit: 3 months                       ____________________________ Cardiology Physician:  Kerry Hough MD [facility]

## 2013-06-14 ENCOUNTER — Ambulatory Visit (HOSPITAL_COMMUNITY)
Admission: RE | Admit: 2013-06-14 | Discharge: 2013-06-14 | Disposition: A | Discharge status: Home / Self Care | Payer: Medicare Other | Source: Ambulatory Visit | Attending: Family Medicine | Admitting: Family Medicine

## 2013-06-14 DIAGNOSIS — IMO0001 Reserved for inherently not codable concepts without codable children: Secondary | ICD-10-CM | POA: Diagnosis not present

## 2013-06-14 NOTE — Progress Notes (Signed)
Physical Therapy Treatment Patient Details  Name: Ashley Nguyen MRN: [id number] Date of Birth: [birth year]  Today's Date: 06/14/2013 Time: 1015-1055 PT Time Calculation (min): 40 min Charge: TE [id number]  Visit#: 7 of 24  Re-eval: 06/18/13 Assessment Diagnosis: Lt TKA Surgical Date: 04/25/13 Next MD Visit: [name] 07/19/2013 Prior Therapy: home health PT  Authorization: Medicare  Authorization Time Period:    Authorization Visit#: 7 of 10   Subjective: Symptoms/Limitations Symptoms: Pt stated she did alot of walking yesterday at cardiologist, has to go to further testing for possible pace maker Pain Assessment Currently in Pain?: Yes Pain Score: 3  Pain Location: Knee Pain Orientation: Left  Precautions/Restrictions  Restrictions Weight Bearing Restrictions: Yes LLE Weight Bearing: Weight bearing as tolerated  Exercise/Treatments Stretches Active Hamstring Stretch: 3 reps;20 seconds;Limitations Active Hamstring Stretch Limitations: on 14 in box Knee: Self-Stretch to increase Flexion: Limitations Knee: Self-Stretch Limitations: 10 reps 5" holds Gastroc Stretch: 3 reps;30 seconds Gastroc Stretch Limitations: slant board Aerobic Stationary Bike: 8' seat 9 @ 1.0 for ROM Standing Lateral Step Up: Left;15 reps;Hand Hold: 1;Step Height: 4" Forward Step Up: Left;15 reps;Hand Hold: 1;Step Height: 6" Functional Squat: 10 reps;3 seconds;Limitations Functional Squat Limitations: SFT plans squats 10 reps each Rocker Board: 2 minutes;Limitations Rocker Board Limitations: R/L and A/P Other Standing Knee Exercises: 3D hip excursion 10 reps Other Standing Knee Exercises: Groin st 3x 20" on 8in box      Physical Therapy Assessment and Plan PT Assessment and Plan Clinical Impression Statement: Pt progressing well towards goals.  Continued AROM and hip mobility exercises to improve gait mechanics, pt with improved arm swing with gait today with minimal cueing.  Added  squats in frontal, sagital and transverse planes to improve functional strength and gait mechanics.  Began stair training for quad strengtheing with minimal cueing required for technique/.  Noted overall edema reduced, Lt LE smaller than opposite LE today,  No manual techniques complete at end of session.  Pt limited by increased activity load, stated pain scale 2/10.  Pt plans to apply ice at home for pain and edema control.   PT Plan: Continue current POC main focus on AROM.  Continue stretches to improve LE flexibilty to increase AROM.  Progress quad strength for knee extension and progress functional strengthening as ROM improves.  Progress standing exercises next session to SFT squats and begin forward and lateral lunges next session.  Progress abduction stretngth.    Goals Home Exercise Program Pt/caregiver will Perform Home Exercise Program: For increased ROM;For increased strengthening PT Short Term Goals Time to Complete Short Term Goals: 4 weeks PT Short Term Goal 1: knee flexion extension to improve to 5-105 degrees to increase stride length PT Short Term Goal 2: Patient to be able to sleep through night without waking >2 times per night due to pain.  PT Short Term Goal 3: Patient hip abduction strength to improve to 4/5 so patient can singkle leg stand >3 seconds without trendelenberg.  PT Long Term Goals Time to Complete Long Term Goals: 8 weeks PT Long Term Goal 1: Patient will be able to sit for 3 hours without knee pain so she can attend church PT Long Term Goal 2: Patient will be able to walk for 5 hours to go shopping with daughter Long Term Goal 3: Patient will be able to stand >2 hours to perform all hiome cooking needs.  Long Term Goal 3 Progress: Progressing toward goal Long Term Goal 4: Patient will be able to ambulate  up and down stairs with reciprocal gait for two flights of stairs to see daughter.  Long Term Goal 4 Progress: Progressing toward goal PT Long Term Goal 5:  knee flexion extension to improve to 0-120 degrees to increase stride length Long Term Goal 5 Progress: Progressing toward goal  Problem List Patient Active Problem List   Diagnosis Date Noted  . PAF (paroxysmal atrial fibrillation) 04/28/2013  . Arthritis of knee 04/25/2013  . Atrial fibrillation 04/25/2013  . History of GI bleed 04/25/2013  . Hypertension   . GERD (gastroesophageal reflux disease)   . Hyperlipidemia   . Diabetes mellitus without complication     PT - End of Session Activity Tolerance: Patient tolerated treatment well General Behavior During Therapy: [facility] for tasks assessed/performed  GP    Aldona Lento 06/14/2013, 11:30 AM

## 2013-06-16 ENCOUNTER — Ambulatory Visit (HOSPITAL_COMMUNITY)
Admission: RE | Admit: 2013-06-16 | Discharge: 2013-06-16 | Disposition: A | Discharge status: Home / Self Care | Payer: Medicare Other | Source: Ambulatory Visit | Attending: Orthopedic Surgery | Admitting: Orthopedic Surgery

## 2013-06-16 DIAGNOSIS — IMO0001 Reserved for inherently not codable concepts without codable children: Secondary | ICD-10-CM | POA: Diagnosis not present

## 2013-06-16 NOTE — Progress Notes (Signed)
Physical Therapy Treatment Patient Details  Name: Ashley Nguyen MRN: [id number] Date of Birth: 1932-09-28  Today's Date: 06/16/2013 Time: 1005-1100 PT Time Calculation (min): 55 min Charge: TE 1005-1100  Visit#: 8 of 24  Re-eval: 06/18/13 Assessment Diagnosis: Lt TKA Surgical Date: 04/25/13 Next MD Visit: [name] 07/19/2013; Cardiologist 06/22/2013 Prior Therapy: home health PT  Authorization: Medicare  Authorization Time Period:    Authorization Visit#: 8 of 10   Subjective: Symptoms/Limitations Symptoms: Pt stated she is going to need a Psychologist, forensic.  Pt stated knee is feeling okay today, c/o back pain and thinks she may have bladder issues today Pain Assessment Currently in Pain?: Yes Pain Score: 2  Pain Location: Knee Pain Orientation: Left  Precautions/Restrictions  Restrictions Weight Bearing Restrictions: Yes LLE Weight Bearing: Weight bearing as tolerated  Exercise/Treatments Stretches Active Hamstring Stretch: 3 reps;30 seconds;Limitations Active Hamstring Stretch Limitations: with rope Knee: Self-Stretch to increase Flexion: Limitations Knee: Self-Stretch Limitations: 10 reps 5" holds Gastroc Stretch: 3 reps;30 seconds Gastroc Stretch Limitations: slant board Aerobic Stationary Bike: 8' seat 9 @ 1.0 for ROM Standing Lateral Step Up: Left;15 reps;Hand Hold: 1;Step Height: 6" Forward Step Up: Left;15 reps;Hand Hold: 1;Step Height: 6" Step Down: Left;10 reps;Hand Hold: 1;Step Height: 4" Functional Squat: 10 reps;3 seconds;Limitations Functional Squat Limitations: SFT plans squats 10 reps each Other Standing Knee Exercises: 3D hip excursion 10 reps Supine Quad Sets: 10 reps;AROM;Left Terminal Knee Extension: Left;10 reps;Limitations Terminal Knee Extension Limitations: 5" holds with towel     Physical Therapy Assessment and Plan PT Assessment and Plan Clinical Impression Statement: Pt progressing well towards functional strengthening goals and  AROM.  Progressed to step down training to improve eccentric quad strengthening with min cueing for technique following demionstration, also added forward and lateral lunges for strengthening.  AROM improving to -5 degrees extension  Pt plans to apply ice for edema and pain control at home.   PT Plan: Continue current POC main focus on AROM.  Continue stretches to improve LE flexibilty to increase AROM.  Progress quad strength for knee extension and progress functional strengthening as ROM improves.  Progress abduction stretngth.    Goals Home Exercise Program Pt/caregiver will Perform Home Exercise Program: For increased ROM;For increased strengthening PT Short Term Goals Time to Complete Short Term Goals: 4 weeks PT Short Term Goal 1: knee flexion extension to improve to 5-105 degrees to increase stride length PT Short Term Goal 2: Patient to be able to sleep through night without waking >2 times per night due to pain.  PT Short Term Goal 3: Patient hip abduction strength to improve to 4/5 so patient can singkle leg stand >3 seconds without trendelenberg.  PT Short Term Goal 3 - Progress: Progressing toward goal PT Long Term Goals Time to Complete Long Term Goals: 8 weeks PT Long Term Goal 1: Patient will be able to sit for 3 hours without knee pain so she can attend church PT Long Term Goal 1 - Progress: Progressing toward goal PT Long Term Goal 2: Patient will be able to walk for 5 hours to go shopping with daughter PT Long Term Goal 2 - Progress: Progressing toward goal Long Term Goal 3: Patient will be able to stand >2 hours to perform all hiome cooking needs.  Long Term Goal 3 Progress: Progressing toward goal Long Term Goal 4: Patient will be able to ambulate up and down stairs with reciprocal gait for two flights of stairs to see daughter.  Long Term Goal 4  Progress: Progressing toward goal PT Long Term Goal 5: knee flexion extension to improve to 0-120 degrees to increase stride  length Long Term Goal 5 Progress: Progressing toward goal  Problem List Patient Active Problem List   Diagnosis Date Noted  . PAF (paroxysmal atrial fibrillation) 04/28/2013  . Arthritis of knee 04/25/2013  . Atrial fibrillation 04/25/2013  . History of GI bleed 04/25/2013  . Hypertension   . GERD (gastroesophageal reflux disease)   . Hyperlipidemia   . Diabetes mellitus without complication     PT - End of Session Activity Tolerance: Patient tolerated treatment well General Behavior During Therapy: [facility] for tasks assessed/performed  GP    Aldona Lento 06/16/2013, 11:05 AM

## 2013-06-19 ENCOUNTER — Ambulatory Visit (HOSPITAL_COMMUNITY)
Admission: RE | Admit: 2013-06-19 | Discharge: 2013-06-19 | Disposition: A | Discharge status: Home / Self Care | Payer: Medicare Other | Source: Ambulatory Visit | Attending: Family Medicine | Admitting: Family Medicine

## 2013-06-19 ENCOUNTER — Telehealth (HOSPITAL_COMMUNITY): Payer: Self-pay

## 2013-06-19 DIAGNOSIS — IMO0001 Reserved for inherently not codable concepts without codable children: Secondary | ICD-10-CM | POA: Insufficient documentation

## 2013-06-19 DIAGNOSIS — M25669 Stiffness of unspecified knee, not elsewhere classified: Secondary | ICD-10-CM | POA: Insufficient documentation

## 2013-06-19 DIAGNOSIS — E119 Type 2 diabetes mellitus without complications: Secondary | ICD-10-CM | POA: Insufficient documentation

## 2013-06-19 DIAGNOSIS — I10 Essential (primary) hypertension: Secondary | ICD-10-CM | POA: Insufficient documentation

## 2013-06-19 DIAGNOSIS — M25569 Pain in unspecified knee: Secondary | ICD-10-CM | POA: Insufficient documentation

## 2013-06-19 NOTE — Progress Notes (Signed)
Physical Therapy Treatment Patient Details  Name: Ashley Nguyen MRN: [id number] Date of Birth: 10-18-1932  Today's Date: 06/19/2013 Time: 1017-1102 PT Time Calculation (min): 45 min  Visit#: 9 of 24  Re-eval: 06/18/13 Authorization: Medicare  Authorization Visit#: 9 of 10  Charges:  therex 44  Subjective: Symptoms/Limitations Symptoms: Pt states she's having "soreness with movement", otherwise no pain.  4/10 with flexion or extension. Pertinent History: 04/25/13 Lt TKA, patient and MD unaware of what heart disorder patient or may not have and is currently weariing a heart monitor to track heart activity Pain Assessment Currently in Pain?: Yes Pain Score: 4  Pain Location: Knee Pain Orientation: Left   Exercise/Treatments Stretches Active Hamstring Stretch: 3 reps;30 seconds;Limitations Active Hamstring Stretch Limitations: with rope Knee: Self-Stretch Limitations: HEP Gastroc Stretch: 3 reps;30 seconds Gastroc Stretch Limitations: slant board Aerobic Stationary Bike: 8' seat 9 @ 1.0 for ROM Standing Forward Lunges: 10 reps;Left Lateral Step Up: Left;15 reps;Hand Hold: 1;Step Height: 6" Forward Step Up: Left;15 reps;Hand Hold: 1;Step Height: 6" Step Down: Left;Hand Hold: 1;10 reps;Step Height: 2" Functional Squat: 10 reps;3 seconds;Limitations Functional Squat Limitations: staggered stance R/L 10 reps each Other Standing Knee Exercises: 3D hip excursion 10 reps Other Standing Knee Exercises: hip abduction against wall 10 reps Supine Short Arc Quad Sets: Left;10 reps Terminal Knee Extension: Left;10 reps;Limitations Terminal Knee Extension Limitations: 5" holds with to     Physical Therapy Assessment and Plan PT Assessment and Plan Clinical Impression Statement: Continued focus on functional strengthening today.  Decreased to 2" for forward step down training due to weakness and pain.  Noted eccentric quad weakness.  Forward lunge added today to increase LE  stability and strength. Abduction against wall added with noted weakness and difficulty.  Pt plans to apply ice for edema and pain control at home PT Plan: Continue stretches to improve LE flexibilty to increase AROM.  Progress quad strength for knee extension and progress functional strengthening as ROM improves.  Progress abduction stretngth.  Add hurdle activity to promote hip flexion/balance.    Problem List Patient Active Problem List   Diagnosis Date Noted  . PAF (paroxysmal atrial fibrillation) 04/28/2013  . Arthritis of knee 04/25/2013  . Atrial fibrillation 04/25/2013  . History of GI bleed 04/25/2013  . Hypertension   . GERD (gastroesophageal reflux disease)   . Hyperlipidemia   . Diabetes mellitus without complication     PT - End of Session Activity Tolerance: Patient tolerated treatment well General Behavior During Therapy: WFL for tasks assessed/performed   Teena Irani, PTA/CLT 06/19/2013, 11:15 AM

## 2013-06-21 ENCOUNTER — Ambulatory Visit (HOSPITAL_COMMUNITY): Payer: Medicare Other | Admitting: Physical Therapy

## 2013-06-22 ENCOUNTER — Encounter: Payer: Self-pay | Admitting: *Deleted

## 2013-06-22 ENCOUNTER — Encounter: Payer: Self-pay | Admitting: Internal Medicine

## 2013-06-22 ENCOUNTER — Ambulatory Visit (INDEPENDENT_AMBULATORY_CARE_PROVIDER_SITE_OTHER): Payer: Medicare Other | Admitting: Internal Medicine

## 2013-06-22 VITALS — BP 145/83 | HR 66 | Ht 65.0 in | Wt 206.0 lb

## 2013-06-22 DIAGNOSIS — I441 Atrioventricular block, second degree: Secondary | ICD-10-CM

## 2013-06-22 DIAGNOSIS — I495 Sick sinus syndrome: Secondary | ICD-10-CM

## 2013-06-22 DIAGNOSIS — Z01812 Encounter for preprocedural laboratory examination: Secondary | ICD-10-CM

## 2013-06-22 DIAGNOSIS — I480 Paroxysmal atrial fibrillation: Secondary | ICD-10-CM

## 2013-06-22 DIAGNOSIS — I4891 Unspecified atrial fibrillation: Secondary | ICD-10-CM

## 2013-06-22 LAB — CBC WITH DIFFERENTIAL/PLATELET
Basophils Absolute: 0 10*3/uL (ref 0.0–0.1)
Basophils Relative: 0.3 % (ref 0.0–3.0)
Eosinophils Absolute: 0.1 10*3/uL (ref 0.0–0.7)
Eosinophils Relative: 1.7 % (ref 0.0–5.0)
HCT: 38.9 % (ref 36.0–46.0)
Hemoglobin: 12.9 g/dL (ref 12.0–15.0)
Lymphocytes Relative: 21.8 % (ref 12.0–46.0)
Lymphs Abs: 1.6 10*3/uL (ref 0.7–4.0)
MCHC: 33.2 g/dL (ref 30.0–36.0)
MCV: 91.5 fl (ref 78.0–100.0)
Monocytes Absolute: 0.7 10*3/uL (ref 0.1–1.0)
Monocytes Relative: 9.4 % (ref 3.0–12.0)
Neutro Abs: 5 10*3/uL (ref 1.4–7.7)
Neutrophils Relative %: 66.8 % (ref 43.0–77.0)
Platelets: 336 10*3/uL (ref 150.0–400.0)
RBC: 4.25 Mil/uL (ref 3.87–5.11)
RDW: 14.5 % (ref 11.5–15.5)
WBC: 7.5 10*3/uL (ref 4.0–10.5)

## 2013-06-22 LAB — BASIC METABOLIC PANEL
BUN: 17 mg/dL (ref 6–23)
CO2: 29 mEq/L (ref 19–32)
Calcium: 9.8 mg/dL (ref 8.4–10.5)
Chloride: 95 mEq/L — ABNORMAL LOW (ref 96–112)
Creatinine, Ser: 1 mg/dL (ref 0.4–1.2)
GFR: 58.23 mL/min — ABNORMAL LOW (ref >60.00)
Glucose, Bld: 106 mg/dL — ABNORMAL HIGH (ref 70–99)
Potassium: 3.8 mEq/L (ref 3.5–5.1)
Sodium: 132 mEq/L — ABNORMAL LOW (ref 135–145)

## 2013-06-22 NOTE — Progress Notes (Signed)
ELECTROPHYSIOLOGY CONSULT NOTE  Patient ID: Ashley Nguyen, MRN: [id number], DOB/AGE: [birth year] 80 y.o. Admit date: (Not on file) Date of Consult: 06/22/2013  Primary Physician: Lynne Logan, MD Primary Cardiologist: Clifford  Chief Complaint: Tachybradycardia syndrome   HPI Ashley Nguyen is a 80 y.o. female  Referred for consideration of pacing.  She has a remote history of a TIA. She was noted following knee surgery have atrial fibrillation with a rapid rate that persisted on her number of days. These episodes were paroxysmal. They're associated with few symptoms. Low-dose beta blockers were undertaken up titration limited by variable blood pressure. An outpatient event recorder demonstrated AV block functional rates in the 20s.  She is not very ambulatory because of her knees.  She has had problems with episodic dizziness that lasts 5-30 seconds or so. She has had falls for which he does not have explanation.  She is hypertension but does not have diabetes. Her CHADS-VASc score is 6. Echocardiogram 4/15 demonstrated normal LV function and "mild-moderate LAE dilatation" despite the fact that the quantitative measurements were normal.          Past Medical History  Diagnosis Date  . Hypertension   . GERD (gastroesophageal reflux disease)   . Hyperlipidemia   . Cataracts, bilateral   . Impaired hearing   . TIA (transient ischemic attack) 22yrs ago  . Seasonal allergies   . History of migraine     last time well over a month ago  . Vertigo   . History of gastric ulcer   . History of GI bleed   . History of colon polyps   . History of kidney stones   . Anemia     at times has been on iron supplements  . Diabetes mellitus without complication     borderline  . PAF (paroxysmal atrial fibrillation)     a. 04/2013 periop L TKA.  . Osteoarthritis     a. 04/2013 s/p L TKA.      Surgical History:  Past Surgical History  Procedure Laterality Date  .  Tonsillectomy      adenoidectomy  . Tubal ligation    . Knee arthroscopy Left   . Thumb surgery Right   . Appendectomy      as a child  . Esophagogastroduodenoscopy    . Colonoscopy    . Total knee arthroplasty Left 04/25/2013    Procedure: TOTAL KNEE ARTHROPLASTY;  Surgeon: Renette Butters, MD;  Location: [facility];  Service: Orthopedics;  Laterality: Left;     Home Meds: Prior to Admission medications   Medication Sig Start Date End Date Taking? Authorizing Provider  colesevelam (WELCHOL) 625 MG tablet Take 625 mg by mouth 2 (two) times daily with a meal.   Yes Historical Provider, MD  cycloSPORINE (RESTASIS) 0.05 % ophthalmic emulsion Place 1 drop into both eyes 2 (two) times daily.   Yes Historical Provider, MD  esomeprazole (NEXIUM) 40 MG capsule Take 40 mg by mouth daily.   Yes Historical Provider, MD  fluticasone (FLONASE) 50 MCG/ACT nasal spray Place 1 spray into the nose daily.   Yes Historical Provider, MD  lisinopril-hydrochlorothiazide (PRINZIDE,ZESTORETIC) 20-25 MG per tablet Take 1 tablet by mouth daily.   Yes Historical Provider, MD  metoprolol (LOPRESSOR) 50 MG tablet Take 25 mg by mouth 2 (two) times daily. 1/2 tablet twice a day 04/28/13  Yes Rogelia Mire, NP  rivaroxaban (XARELTO) 20 MG TABS tablet Take 20 mg by mouth daily with supper.  Yes Historical Provider, MD     Allergies:  Allergies  Allergen Reactions  . Clams [Shellfish Allergy] Nausea And Vomiting    Per pt, NOT a general shellfish allergy.  Clams only.  . Desipramine Hcl Other (See Comments)    "painful urination, then kidneys shut down for hours."  . Lipitor [Atorvastatin] Other (See Comments)    Muscle aches.  . Plavix [Clopidogrel Bisulfate] Other (See Comments)    "Almost bled to death."  . Sulfa Antibiotics Hives  . Verapamil Swelling  . Penicillins Hives    History   Social History  . Marital Status: Married    Spouse Name: N/A    Number of Children: N/A  . Years of Education: N/A    Occupational History  . Not on file.   Social History Main Topics  . Smoking status: Never Smoker   . Smokeless tobacco: Never Used  . Alcohol Use: No  . Drug Use: No  . Sexual Activity: Yes    Birth Control/ Protection: Post-menopausal   Other Topics Concern  . Not on file   Social History Narrative   M[employer]on           No family history on file.   ROS:  Please see the history of present illness.     All other systems reviewed and negative.    Physical Exam: Blood pressure 145/83, pulse 66, height 5\' 5"  (1.651 m), weight 206 lb (93.441 kg). General: Well developed, well nourished obesity female in no acute distress. Head: Normocephalic, atraumatic, sclera non-icteric, no xanthomas, nares are without discharge. EENT: normal Lymph Nodes:  none Back: without scoliosis/kyphosis no CVA tendersness Neck: Negative for carotid bruits. JVD 8-10 Lungs: Clear bilaterally to auscultation without wheezes, rales, or rhonchi. Breathing is unlabored. Heart: RRR with S1 S2. No  murmur , rubs, or gallops appreciated. Abdomen: Soft, non-tender, non-distended with normoactive bowel sounds. No hepatomegaly. No rebound/guarding. No obvious abdominal masses. Msk:  Strength and tone appear normal for age. Extremities: No clubbing or cyanosis. Tr edema.  Distal pedal pulses are 2+ and equal bilaterally. Skin: Warm and Dry Neuro: Alert and oriented X 3. CN III-XII intact Grossly normal sensory and motor function . Psych:  Responds to questions appropriately with a normal affect.      Labs: Cardiac Enzymes No results found for this basename: CKTOTAL, CKMB, TROPONINI,  in the last 72 hours CBC Lab Results  Component Value Date   WBC 9.6 04/26/2013   HGB 11.1* 04/26/2013   HCT 32.9* 04/26/2013   MCV 91.6 04/26/2013   PLT 249 04/26/2013   PROTIME: No results found for this basename: LABPROT, INR,  in the last 72 hours Chemistry No results found for this basename: NA, K, CL, CO2, BUN, CREATININE,  CALCIUM, LABALBU, PROT, BILITOT, ALKPHOS, ALT, AST, GLUCOSE,  in the last 168 hours Lipids No results found for this basename: CHOL, HDL, LDLCALC, TRIG   BNP No results found for this basename: probnp   Miscellaneous No results found for this basename: DDIMER    Radiology/Studies:  No results found.  EKG:  Sinus rhythm 64 Intervals 18/08/42 Otherwise normal  Telemetry was reviewed from the hospital atrial fibrillation at rates of 140-150  Telemetry report described by Dr. Wynonia Lawman includes 2-1 heart block and hours while awake with rates in the high 20s--reviewed agree  Assessment and Plan:  Atrial fibrillation with a rapid ventricular response-paroxysmal  2-1 heart block  Presyncope/falls  Daytime somnolence and obstructive nocturnal breathing  The patient  has tachybradycardia syndrome with heart rates ranging from the high 20s to low 140s. Anti bradycardia pacing is indicated especially in light of her falls and presyncope areas The benefits and risks were reviewed including but not limited to death,  perforation, infection, lead dislodgement and device malfunction.  The patient understands agrees and is willing to proceed.  Control of her heart rates will be further challenged by her tendency towards hypotension.  She also significant daytime somnolence and obstructive nocturnal breathing; with her atrial fibrillation I think given valuable to undertake a sleep study. I will defer this to Dr. Benay Pillow

## 2013-06-22 NOTE — Patient Instructions (Signed)
Your physician recommends that you continue on your current medications as directed. Please refer to the Current Medication list given to you today.  Pre procedure labs today: CBCD, BMET  Your physician has recommended that you have a pacemaker inserted. A pacemaker is a small device that is placed under the skin of your chest or abdomen to help control abnormal heart rhythms. This device uses electrical pulses to prompt the heart to beat at a normal rate. Pacemakers are used to treat heart rhythms that are too slow. Wire (leads) are attached to the pacemaker that goes into the chambers of you heart. This is done in the hospital and usually requires and overnight stay. Please see the instruction sheet given to you today for more information.  Your wound check is scheduled for: 07/13/13 at 10:30

## 2013-06-23 ENCOUNTER — Ambulatory Visit (HOSPITAL_COMMUNITY)
Admission: RE | Admit: 2013-06-23 | Discharge: 2013-06-23 | Disposition: A | Discharge status: Home / Self Care | Payer: Medicare Other | Source: Ambulatory Visit | Attending: Family Medicine | Admitting: Family Medicine

## 2013-06-23 DIAGNOSIS — IMO0001 Reserved for inherently not codable concepts without codable children: Secondary | ICD-10-CM | POA: Diagnosis not present

## 2013-06-23 NOTE — Progress Notes (Signed)
Physical Therapy Re-evaluation/Treatment note  Patient Details  Name: Ashley Nguyen MRN: [id number] Date of Birth: 1932/02/21  Today's Date: 06/23/2013 Time: 1015-1108 PT Time Calculation (min): 53 min Charge: TE 3009-2330, MMT/ROM Meaurement 1042-1050 , Manual 1050-1100, Self care 1100-1108               Visit#: 10 of 24  Re-eval: 07/21/13 Assessment Diagnosis: Lt TKA Surgical Date: 04/25/13 Next MD Visit: [name] 07/19/2013; Cardiologist 10 days following pacemaker surgery on 07/04/2013 Prior Therapy: home health PT  Authorization: Medicare    Authorization Time Period:    Authorization Visit#: 10 of 20   Subjective Symptoms/Limitations Symptoms: Pt stated she has scheduled pacemaker surgery on 07/04/2013.  Reported pain initially over incision feeling really tight, pain free following bicycle.   How long can you sit comfortably?: Able to sit for 1 hour comfortably(was knee begins throbbing after 30-45 minutes) How long can you stand comfortably?: Able to stand long enough to cook without restbreaks required (was Patient is able to toelrate stanidng for <64min) How long can you walk comfortably?: Able to walk for 1-2 blocks, able to walk 25-30 minutes (was Patient is able to tolerate walking for <69minutes) Pain Assessment Currently in Pain?: No/denies  Precautions/Restrictions  Restrictions Weight Bearing Restrictions: Yes LLE Weight Bearing: Weight bearing as tolerated  Sensation/Coordination/Flexibility/Functional Tests Functional Tests Functional Tests: FOTO 58% status (was 48%)  Assessment LLE AROM (degrees) Left Knee Extension: -2 ( was -2) Left Knee Flexion: 110 (was 103) LLE Strength Left Hip Flexion:  (4+/5 was 4/5) Left Hip Extension: 3/5 Left Hip ABduction: 3/5 Left Knee Flexion: 4/5 (was 4/5) Left Knee Extension:  (4+/5) Left Ankle Dorsiflexion: 4/5 (was 4/5) Left Ankle Plantar Flexion: 4/5  Exercise/Treatments Mobility/Balance       Stretches Active Hamstring Stretch: 3 reps;30 seconds;Limitations Active Hamstring Stretch Limitations: with rope Knee: Self-Stretch to increase Flexion: Limitations Knee: Self-Stretch Limitations: on 14 in box Gastroc Stretch: 3 reps;30 seconds Gastroc Stretch Limitations: slant board Aerobic Stationary Bike: 8' seat 9 @ 1.0 for ROM Machines for Strengthening   Plyometrics   Standing SLS: Rt 7", Lt HHA required Other Standing Knee Exercises: 3D hip excursion 10 reps Seated   Supine Quad Sets: 10 reps;AROM;Left Heel Slides: 10 reps;AROM;Left Terminal Knee Extension: Left;10 reps;Limitations Terminal Knee Extension Limitations: 5" holds with to Sidelying   Prone      Manual Therapy Manual Therapy: Myofascial release Myofascial Release: MFR around incision perimeter, retro massage wtih elevation for edema control, STM to quadriceps to reduce tightness  Physical Therapy Assessment and Plan PT Assessment and Plan Clinical Impression Statement: Re-assessment complete with the following findings:  Pt compliant with HEP dialy and able to demonstrate appropriate technique with all exercises without difficulty.  Improved activity tolerance with sitting, standing and walking.  AROM improving -2 - 110 with PROM 0-114.  Strength is progressing slowly.  Recommend continueing OPPT for 4 more weeks to improve functional strength, AROM and improve activity tolerance towards PT POC.  During discussion about pace maker surgery pt given HEP for bed exercises to continue when in hospital.  Pt encouraged to wear her compresion hose for edema control, referral sent for thigh high compression.  Pt given comporession hose information in [place]. PT Plan: Recommend continuing OPPT for 4 weeks to improve AROM and strengthening.  Continue current POC main focus on AROM.  Continue stretches to improve LE flexibilty to increase AROM.  Progress quad strength for knee extension and progress functional  strengthening as ROM  improves.  Progress abduction stretngth.  Add hurdle activity to promote hip flexion/balance.    Goals Home Exercise Program Pt/caregiver will Perform Home Exercise Program: For increased ROM;For increased strengthening PT Goal: Perform Home Exercise Program - Progress: Met PT Short Term Goals Time to Complete Short Term Goals: 4 weeks PT Short Term Goal 1: knee flexion extension to improve to 5-105 degrees to increase stride length PT Short Term Goal 1 - Progress: Met PT Short Term Goal 2: Patient to be able to sleep through night without waking >2 times per night due to pain.  PT Short Term Goal 2 - Progress: Progressing toward goal PT Short Term Goal 3: Patient hip abduction strength to improve to 4/5 so patient can singkle leg stand >3 seconds without trendelenberg.  PT Short Term Goal 3 - Progress: Progressing toward goal PT Long Term Goals Time to Complete Long Term Goals: 8 weeks PT Long Term Goal 1: Patient will be able to sit for 3 hours without knee pain so she can attend church PT Long Term Goal 1 - Progress: Progressing toward goal PT Long Term Goal 2: Patient will be able to walk for 5 hours to go shopping with daughter PT Long Term Goal 2 - Progress: Progressing toward goal Long Term Goal 3: Patient will be able to stand >2 hours to perform all hiome cooking needs.  Long Term Goal 3 Progress: Progressing toward goal Long Term Goal 4: Patient will be able to ambulate up and down stairs with reciprocal gait for two flights of stairs to see daughter.  Long Term Goal 4 Progress: Progressing toward goal PT Long Term Goal 5: knee flexion extension to improve to 0-120 degrees to increase stride length Long Term Goal 5 Progress: Progressing toward goal  Problem List Patient Active Problem List   Diagnosis Date Noted  . PAF (paroxysmal atrial fibrillation) 04/28/2013  . Arthritis of knee 04/25/2013  . Atrial fibrillation 04/25/2013  . History of GI bleed  04/25/2013  . Hypertension   . GERD (gastroesophageal reflux disease)   . Hyperlipidemia   . Diabetes mellitus without complication     PT - End of Session Activity Tolerance: Patient tolerated treatment well General Behavior During Therapy: WFL for tasks assessed/performed PT Plan of Care PT Home Exercise Plan: quad sets, heel slides, SLR, ABD, ankle pumps and LAQ HEP for hospital stay wtih pacemaker surgery  GP Functional Assessment Tool Used: FOTO Status: 48%, Limitation: 52% Functional Limitation: Mobility: Walking and moving around Mobility: Walking and Moving Around Current Status ([id number]): At least 40 percent but less than 60 percent impaired, limited or restricted Mobility: Walking and Moving Around Goal Status [phone]): At least 40 percent but less than 60 percent impaired, limited or restricted  Aldona Lento 06/23/2013, 2:26 PM  Physician Documentation Your signature is required to indicate approval of the treatment plan as stated above.  Please sign and either send electronically or make a copy of this report for your files and return this physician signed original.   Please mark one 1.__approve of plan  2. ___approve of plan with the following conditions.   ______________________________                                                          _____________________ Physician Signature  Date  

## 2013-06-26 ENCOUNTER — Ambulatory Visit (HOSPITAL_COMMUNITY)
Admission: RE | Admit: 2013-06-26 | Discharge: 2013-06-26 | Disposition: A | Discharge status: Home / Self Care | Payer: Medicare Other | Source: Ambulatory Visit | Attending: Orthopedic Surgery | Admitting: Orthopedic Surgery

## 2013-06-26 DIAGNOSIS — IMO0001 Reserved for inherently not codable concepts without codable children: Secondary | ICD-10-CM | POA: Diagnosis not present

## 2013-06-26 NOTE — Progress Notes (Signed)
Physical Therapy Treatment Patient Details  Name: Ashley Nguyen MRN: [id number] Date of Birth: 1932-05-08  Today's Date: 06/26/2013 Time: 1012-1100 PT Time Calculation (min): 48 min Charge: TE 1012-1138, NMR 1138-1100   Visit#: 11 of 24  Re-eval: 07/21/13 Assessment Diagnosis: Lt TKA Surgical Date: 04/25/13 Next MD Visit: [name] 07/19/2013; Cardiologist 10 days following pacemaker surgery on 07/04/2013 Prior Therapy: home health PT  Authorization: Medicare  Authorization Time Period:    Authorization Visit#: 11 of 20   Subjective: Symptoms/Limitations Symptoms: Pt reported she went swimming over weekend, completed exercises in pool that felt good.   Pain Assessment Currently in Pain?: Yes Pain Score: 3  Pain Location: Knee Pain Orientation: Left  Precautions/Restrictions  Restrictions Weight Bearing Restrictions: Yes LLE Weight Bearing: Weight bearing as tolerated  Exercise/Treatments Stretches Active Hamstring Stretch: 3 reps;30 seconds;Limitations Active Hamstring Stretch Limitations: on 14in step 3 directions Knee: Self-Stretch to increase Flexion: Limitations Knee: Self-Stretch Limitations: on 14 in box Gastroc Stretch: 3 reps;30 seconds Gastroc Stretch Limitations: slant board Aerobic Stationary Bike: 8' seat 9 @ 1.0 for ROM Standing Lateral Step Up: Left;10 reps;Hand Hold: 1;Step Height: 4";Limitations Lateral Step Up Limitations: limited by fatigue/ SOB Forward Step Up: Left;10 reps;Hand Hold: 1;Step Height: 6";Limitations Forward Step Up Limitations: SOB Step Down: Left;10 reps;Hand Hold: 1;Step Height: 6";Limitations Step Down Limitations: limited by fatigue/SOB SLS: R 19" Lt 5" ,max of 3 Other Standing Knee Exercises: sidestepping with green tband, retro , tandem gait with 1 HHA     Physical Therapy Assessment and Plan PT Assessment and Plan Clinical Impression Statement: Pt limited by fatigue/SOB through session.  Increased ease with  stair training height 6 in step but had to reduce reps due to SOB.  Added balance training activties for stability to improve gait mechanics and confidence walking wtih no AD.  No reports of increased pain just limited by fatigue at end of session. PT Plan: Continue with current POC to improve AROM and strengthening.  Progress quad and glut med strengthening.   Added hurdle activity next session to promote hip flexion and balance.    Goals Home Exercise Program Pt/caregiver will Perform Home Exercise Program: For increased ROM;For increased strengthening PT Short Term Goals Time to Complete Short Term Goals: 4 weeks PT Short Term Goal 1: knee flexion extension to improve to 5-105 degrees to increase stride length PT Short Term Goal 2: Patient to be able to sleep through night without waking >2 times per night due to pain.  PT Short Term Goal 3: Patient hip abduction strength to improve to 4/5 so patient can singkle leg stand >3 seconds without trendelenberg.  PT Short Term Goal 3 - Progress: Progressing toward goal PT Long Term Goals Time to Complete Long Term Goals: 8 weeks PT Long Term Goal 1: Patient will be able to sit for 3 hours without knee pain so she can attend church PT Long Term Goal 1 - Progress: Progressing toward goal PT Long Term Goal 2: Patient will be able to walk for 5 hours to go shopping with daughter PT Long Term Goal 2 - Progress: Progressing toward goal Long Term Goal 3: Patient will be able to stand >2 hours to perform all hiome cooking needs.  Long Term Goal 3 Progress: Progressing toward goal Long Term Goal 4: Patient will be able to ambulate up and down stairs with reciprocal gait for two flights of stairs to see daughter.  Long Term Goal 4 Progress: Progressing toward goal PT Long Term Goal  5: knee flexion extension to improve to 0-120 degrees to increase stride length  Problem List Patient Active Problem List   Diagnosis Date Noted  . PAF (paroxysmal atrial  fibrillation) 04/28/2013  . Arthritis of knee 04/25/2013  . Atrial fibrillation 04/25/2013  . History of GI bleed 04/25/2013  . Hypertension   . GERD (gastroesophageal reflux disease)   . Hyperlipidemia   . Diabetes mellitus without complication     PT - End of Session Activity Tolerance: Patient limited by fatigue;Patient tolerated treatment well General Behavior During Therapy: [facility] for tasks assessed/performed  GP    Aldona Lento 06/26/2013, 11:32 AM

## 2013-06-28 ENCOUNTER — Encounter: Payer: Self-pay | Admitting: Cardiology

## 2013-06-28 ENCOUNTER — Ambulatory Visit (HOSPITAL_COMMUNITY)
Admission: RE | Admit: 2013-06-28 | Discharge: 2013-06-28 | Disposition: A | Discharge status: Home / Self Care | Payer: Medicare Other | Source: Ambulatory Visit | Attending: Family Medicine | Admitting: Family Medicine

## 2013-06-28 DIAGNOSIS — IMO0001 Reserved for inherently not codable concepts without codable children: Secondary | ICD-10-CM | POA: Diagnosis not present

## 2013-06-28 NOTE — Progress Notes (Signed)
Physical Therapy Treatment Patient Details  Name: Ashley Nguyen MRN: [id number] Date of Birth: 1932/04/30  Today's Date: 06/28/2013 Time: 1016-1108 PT Time Calculation (min): 52 min Visit#: 12 of 24  Re-eval: 07/21/13 Authorization: Medicare  Authorization Visit#: 12 of 20  Charges:  therex [id number] (28'), manual 1045-1105 (20')  Subjective: Symptoms/Limitations Symptoms: Pt states she is extremely stiff today.  Reports she is hurting a little more today as well.   Pain Assessment Currently in Pain?: Yes Pain Score: 4  Pain Location: Knee Pain Orientation: Left   Exercise/Treatments Stretches Active Hamstring Stretch: 3 reps;30 seconds;Limitations Active Hamstring Stretch Limitations: on 14in step 3 directions Knee: Self-Stretch to increase Flexion: Limitations Knee: Self-Stretch Limitations: on 14 in box 3X30" Gastroc Stretch: 3 reps;30 seconds Gastroc Stretch Limitations: slant board Aerobic Stationary Bike: 8' seat 9 @ 1.0 for ROM Standing Lateral Step Up: Left;10 reps;Hand Hold: 1;Step Height: 4";Limitations Forward Step Up: Left;10 reps;Hand Hold: 1;Step Height: 6";Limitations Step Down: Left;10 reps;Hand Hold: 1;Step Height: 6";Limitations   Manual Therapy Manual Therapy: Other (comment) Other Manual Therapy: MFR around incision perimeter, retro massage wtih elevation for edema control, STM to quadriceps to reduce tightness  Physical Therapy Assessment and Plan PT Assessment and Plan Clinical Impression Statement: Less fatique noted today with activity.  Pt with increased stiffness, swelling and heat in Lt knee today.  Focused more on elieviating stiffness and pain today.  Held balance activities.  Overall improvment noted at end of session.  Encouraged patient to ice knee when she returns home.  Pt verbalized understanding.   PT Plan: Continue with current POC to improve AROM and strengthening.  Progress quad and glut med strengthening.   Resume balance  activity next session.    Goals    Problem List Patient Active Problem List   Diagnosis Date Noted  . PAF (paroxysmal atrial fibrillation) 04/28/2013  . Arthritis of knee 04/25/2013  . Atrial fibrillation 04/25/2013  . History of GI bleed 04/25/2013  . Hypertension   . GERD (gastroesophageal reflux disease)   . Hyperlipidemia   . Diabetes mellitus without complication     PT - End of Session Activity Tolerance: Patient limited by fatigue;Patient tolerated treatment well General Behavior During Therapy: [facility] for tasks assessed/performed  GP    Mare Ferrari, Vincente Asbridge B 06/28/2013, 11:21 AM

## 2013-06-29 ENCOUNTER — Encounter (HOSPITAL_COMMUNITY): Payer: Self-pay | Admitting: Pharmacy Technician

## 2013-06-30 ENCOUNTER — Ambulatory Visit (HOSPITAL_COMMUNITY)
Admission: RE | Admit: 2013-06-30 | Discharge: 2013-06-30 | Disposition: A | Discharge status: Home / Self Care | Payer: Medicare Other | Source: Ambulatory Visit | Attending: Family Medicine | Admitting: Family Medicine

## 2013-06-30 DIAGNOSIS — IMO0001 Reserved for inherently not codable concepts without codable children: Secondary | ICD-10-CM | POA: Diagnosis not present

## 2013-06-30 NOTE — Progress Notes (Signed)
Physical Therapy Treatment Patient Details  Name: Ashley Nguyen MRN: [id number] Date of Birth: November 19, 1932  Today's Date: 06/30/2013 Time: [id number] PT Time Calculation (min): 45 min Charge:  There ex 1030-1102; manual [id number] Visit#: 13 of 24  Re-eval: 07/21/13    Authorization: Medicare    Authorization Visit#: 13 of 20   Subjective: Symptoms/Limitations Symptoms: PT states she is doing well continues to experience stiffness in her knee.   Pain Assessment Pain Score: 3  Pain Location: Knee Pain Orientation: Left    Exercise/Treatments     Stretches Gastroc Stretch: 3 reps;30 seconds Gastroc Stretch Limitations: slant board Aerobic Stationary Bike: 10' seet at 9  Standing Knee Flexion: 10 reps Lateral Step Up: 10 reps;Step Height: 4";Left Forward Step Up: Left;10 reps;Step Height: 4" Rocker Board: 2 minutes SLS with Vectors: 3x 10"   Supine Quad Sets: 10 reps Heel Slides: 10 reps Patellar Mobs: 10   Manual Therapy Manual Therapy: Edema management Edema Management: manual decongestive techniques from thigh to ankle concentrating on medial aspect of knee and distal inner thigh where there is increased congestion. Patella mobs.  Physical Therapy Assessment and Plan PT Assessment and Plan Clinical Impression Statement: Pt has edema in LE but more concentrated along medial aspect of knee.  Manual techniques  somewhat decongested the area.  Therapist reminded pt to attempt to walk heel toe as pt is walking flat footed; explained that heel toe gt will improve the calf pump mechanism and help to decrease edema.   All exercises were facilitated by therapist to ensure proper technique and safety. PT Plan: continue to work with edema control as this is the pt main complaint at this time.    Goals  progressing  Problem List Patient Active Problem List   Diagnosis Date Noted  . PAF (paroxysmal atrial fibrillation) 04/28/2013  . Arthritis of knee 04/25/2013  .  Atrial fibrillation 04/25/2013  . History of GI bleed 04/25/2013  . Hypertension   . GERD (gastroesophageal reflux disease)   . Hyperlipidemia   . Diabetes mellitus without complication     PT - End of Session Activity Tolerance: Patient tolerated treatment well  GP    Chaunta Bejarano,CINDY 06/30/2013, 3:09 PM

## 2013-07-03 ENCOUNTER — Ambulatory Visit (HOSPITAL_COMMUNITY)
Admission: RE | Admit: 2013-07-03 | Discharge: 2013-07-03 | Disposition: A | Discharge status: Home / Self Care | Payer: Medicare Other | Source: Ambulatory Visit | Attending: Family Medicine | Admitting: Family Medicine

## 2013-07-03 DIAGNOSIS — IMO0001 Reserved for inherently not codable concepts without codable children: Secondary | ICD-10-CM | POA: Diagnosis not present

## 2013-07-03 MED ORDER — MUPIROCIN 2 % EX OINT
TOPICAL_OINTMENT | Freq: Two times a day (BID) | CUTANEOUS | Status: DC
  Administered 2013-07-04: 12:00:00 via NASAL
  Filled 2013-07-03 (×2): qty 22

## 2013-07-03 MED ORDER — SODIUM CHLORIDE 0.9 % IV SOLN
INTRAVENOUS | Status: DC
  Administered 2013-07-04: 12:00:00 via INTRAVENOUS

## 2013-07-03 MED ORDER — SODIUM CHLORIDE 0.9 % IR SOLN
80.0000 mg | Status: DC
  Filled 2013-07-03: qty 2

## 2013-07-03 MED ORDER — VANCOMYCIN HCL IN DEXTROSE 1-5 GM/200ML-% IV SOLN
1000.0000 mg | INTRAVENOUS | Status: DC
  Filled 2013-07-03: qty 200

## 2013-07-03 NOTE — Progress Notes (Signed)
Physical Therapy Treatment Patient Details  Name: Ashley Nguyen MRN: [id number] Date of Birth: 04/13/1932  Today's Date: 07/03/2013 Time: 1015-1101 PT Time Calculation (min): 46 min  Visit#: 14 of 24  Re-eval: 07/21/13 Assessment Next MD Visit: Percell Miller 07/19/13 Authorization: Medicare  Authorization Visit#: 14 of 20  Charges:  therex 1015-1045 (30'), manual 1045-1100 (15')  Subjective: Symptoms/Limitations Symptoms: Pt states she is now able to get her socks on.  pt states she hasn't been able to do this in years, even before her knee surgery.  Pt states her ROM was measured at 110 last visit and was 114 before, which discouraged her.   Pain Assessment Currently in Pain?: Yes Pain Score: 2  Pain Location: Knee Pain Orientation: Left  Objective: AROM Lt knee:  5-110 degrees, AAROM Lt knee flexion:  115 degrees   Exercise/Treatments Stretches Active Hamstring Stretch: 3 reps;30 seconds;Limitations Active Hamstring Stretch Limitations: on 14in step 3 directions Knee: Self-Stretch to increase Flexion: Limitations Knee: Self-Stretch Limitations: on 14 in box 3X30" Gastroc Stretch: 3 reps;30 seconds Gastroc Stretch Limitations: slant board Aerobic Stationary Bike: 10' seat at 7  Supine Quad Sets: 10 reps Heel Slides: 10 reps   Manual Therapy Manual Therapy: Edema management Edema Management: Retro massage techniques entire Lt LE to decrease swelling  Physical Therapy Assessment and Plan PT Assessment and Plan Clinical Impression Statement: Able to increase seat closer on bike by 2 levels to increase knee flexion.  Continued with manual techniques to decrease swelling and increase ROM.  Pt able to complete therex in correct form with minimal cueing.   Supine AROM of Lt knee 5-110 today, AAROM flexion to 115 degrees with only minimal c/o pain.   PT Plan: Continue to progress towards goals with edema control main priority.  Pt to have surgery tomorrow (pacemaker  insertion) with hospital stay, so will need re-evaluation next visit due to change in medical status.    Problem List Patient Active Problem List   Diagnosis Date Noted  . PAF (paroxysmal atrial fibrillation) 04/28/2013  . Arthritis of knee 04/25/2013  . Atrial fibrillation 04/25/2013  . History of GI bleed 04/25/2013  . Hypertension   . GERD (gastroesophageal reflux disease)   . Hyperlipidemia   . Diabetes mellitus without complication     PT - End of Session Activity Tolerance: Patient tolerated treatment well General Behavior During Therapy: WFL for tasks assessed/performed   Teena Irani, PTA/CLT 07/03/2013, 11:56 AM

## 2013-07-04 ENCOUNTER — Encounter (HOSPITAL_COMMUNITY): Payer: Self-pay | Admitting: General Practice

## 2013-07-04 ENCOUNTER — Encounter (HOSPITAL_COMMUNITY)
Admission: RE | Disposition: A | Discharge status: Home / Self Care | Payer: Self-pay | Source: Ambulatory Visit | Attending: Internal Medicine

## 2013-07-04 ENCOUNTER — Ambulatory Visit (HOSPITAL_COMMUNITY)
Admission: RE | Admit: 2013-07-04 | Discharge: 2013-07-05 | Disposition: A | Discharge status: Home / Self Care | Payer: Medicare Other | Source: Ambulatory Visit | Attending: Internal Medicine | Admitting: Internal Medicine

## 2013-07-04 DIAGNOSIS — Z01812 Encounter for preprocedural laboratory examination: Secondary | ICD-10-CM

## 2013-07-04 DIAGNOSIS — Z7901 Long term (current) use of anticoagulants: Secondary | ICD-10-CM | POA: Insufficient documentation

## 2013-07-04 DIAGNOSIS — I495 Sick sinus syndrome: Secondary | ICD-10-CM | POA: Insufficient documentation

## 2013-07-04 DIAGNOSIS — Z96659 Presence of unspecified artificial knee joint: Secondary | ICD-10-CM | POA: Insufficient documentation

## 2013-07-04 DIAGNOSIS — Z8673 Personal history of transient ischemic attack (TIA), and cerebral infarction without residual deficits: Secondary | ICD-10-CM | POA: Insufficient documentation

## 2013-07-04 DIAGNOSIS — M1710 Unilateral primary osteoarthritis, unspecified knee: Secondary | ICD-10-CM

## 2013-07-04 DIAGNOSIS — I498 Other specified cardiac arrhythmias: Secondary | ICD-10-CM | POA: Diagnosis present

## 2013-07-04 DIAGNOSIS — Z860100 Personal history of colon polyps, unspecified: Secondary | ICD-10-CM | POA: Insufficient documentation

## 2013-07-04 DIAGNOSIS — I441 Atrioventricular block, second degree: Secondary | ICD-10-CM

## 2013-07-04 DIAGNOSIS — I10 Essential (primary) hypertension: Secondary | ICD-10-CM | POA: Insufficient documentation

## 2013-07-04 DIAGNOSIS — E785 Hyperlipidemia, unspecified: Secondary | ICD-10-CM | POA: Insufficient documentation

## 2013-07-04 DIAGNOSIS — E119 Type 2 diabetes mellitus without complications: Secondary | ICD-10-CM

## 2013-07-04 DIAGNOSIS — Z8719 Personal history of other diseases of the digestive system: Secondary | ICD-10-CM

## 2013-07-04 DIAGNOSIS — I4891 Unspecified atrial fibrillation: Secondary | ICD-10-CM | POA: Insufficient documentation

## 2013-07-04 DIAGNOSIS — K219 Gastro-esophageal reflux disease without esophagitis: Secondary | ICD-10-CM | POA: Insufficient documentation

## 2013-07-04 DIAGNOSIS — I480 Paroxysmal atrial fibrillation: Secondary | ICD-10-CM

## 2013-07-04 DIAGNOSIS — I440 Atrioventricular block, first degree: Secondary | ICD-10-CM | POA: Insufficient documentation

## 2013-07-04 HISTORY — DX: Headache: R51

## 2013-07-04 HISTORY — DX: Migraine, unspecified, not intractable, without status migrainosus: G43.909

## 2013-07-04 HISTORY — DX: Unspecified malignant neoplasm of skin, unspecified: C44.90

## 2013-07-04 HISTORY — DX: Headache, unspecified: R51.9

## 2013-07-04 HISTORY — PX: PACEMAKER INSERTION: SHX728

## 2013-07-04 HISTORY — DX: Prediabetes: R73.03

## 2013-07-04 HISTORY — PX: PERMANENT PACEMAKER INSERTION: SHX5480

## 2013-07-04 HISTORY — DX: Iron deficiency anemia, unspecified: D50.9

## 2013-07-04 HISTORY — DX: Bradycardia, unspecified: R00.1

## 2013-07-04 HISTORY — DX: Calculus of kidney: N20.0

## 2013-07-04 LAB — SURGICAL PCR SCREEN
MRSA, PCR: NEGATIVE
Staphylococcus aureus: NEGATIVE

## 2013-07-04 SURGERY — PERMANENT PACEMAKER INSERTION
Anesthesia: LOCAL

## 2013-07-04 MED ORDER — ONDANSETRON HCL 4 MG/2ML IJ SOLN: 4.0000 mg | Freq: Four times a day (QID) | INTRAMUSCULAR | Status: DC | PRN

## 2013-07-04 MED ORDER — FENTANYL CITRATE 0.05 MG/ML IJ SOLN
INTRAMUSCULAR | Status: AC
  Filled 2013-07-04: qty 2

## 2013-07-04 MED ORDER — HYDROCHLOROTHIAZIDE 25 MG PO TABS
25.0000 mg | ORAL_TABLET | Freq: Every day | ORAL | Status: DC
  Administered 2013-07-05: 25 mg via ORAL
  Filled 2013-07-04: qty 1

## 2013-07-04 MED ORDER — LIDOCAINE HCL (PF) 1 % IJ SOLN
INTRAMUSCULAR | Status: AC
  Filled 2013-07-04: qty 60

## 2013-07-04 MED ORDER — LISINOPRIL-HYDROCHLOROTHIAZIDE 20-25 MG PO TABS: 1.0000 | ORAL_TABLET | Freq: Every day | ORAL | Status: DC

## 2013-07-04 MED ORDER — MIDAZOLAM HCL 5 MG/5ML IJ SOLN
INTRAMUSCULAR | Status: AC
  Filled 2013-07-04: qty 5

## 2013-07-04 MED ORDER — METOPROLOL TARTRATE 25 MG PO TABS
25.0000 mg | ORAL_TABLET | Freq: Two times a day (BID) | ORAL | Status: DC
  Administered 2013-07-04 – 2013-07-05 (×2): 25 mg via ORAL
  Filled 2013-07-04 (×3): qty 1

## 2013-07-04 MED ORDER — FLUTICASONE PROPIONATE 50 MCG/ACT NA SUSP
2.0000 | Freq: Every day | NASAL | Status: DC
  Administered 2013-07-04: 2 via NASAL
  Filled 2013-07-04: qty 16

## 2013-07-04 MED ORDER — HEPARIN (PORCINE) IN NACL 2-0.9 UNIT/ML-% IJ SOLN
INTRAMUSCULAR | Status: AC
  Filled 2013-07-04: qty 500

## 2013-07-04 MED ORDER — LIDOCAINE HCL (PF) 1 % IJ SOLN
INTRAMUSCULAR | Status: AC
  Filled 2013-07-04: qty 30

## 2013-07-04 MED ORDER — SODIUM CHLORIDE 0.9 % IV SOLN: INTRAVENOUS | Status: AC

## 2013-07-04 MED ORDER — VANCOMYCIN HCL IN DEXTROSE 1-5 GM/200ML-% IV SOLN
1000.0000 mg | Freq: Two times a day (BID) | INTRAVENOUS | Status: AC
  Administered 2013-07-04: 1000 mg via INTRAVENOUS
  Filled 2013-07-04: qty 200

## 2013-07-04 MED ORDER — LISINOPRIL 20 MG PO TABS
20.0000 mg | ORAL_TABLET | Freq: Every day | ORAL | Status: DC
  Administered 2013-07-05: 20 mg via ORAL
  Filled 2013-07-04: qty 1

## 2013-07-04 MED ORDER — CYCLOSPORINE 0.05 % OP EMUL
1.0000 [drp] | Freq: Two times a day (BID) | OPHTHALMIC | Status: DC
  Administered 2013-07-04 – 2013-07-05 (×2): 1 [drp] via OPHTHALMIC
  Filled 2013-07-04 (×4): qty 1

## 2013-07-04 MED ORDER — RIVAROXABAN 20 MG PO TABS
20.0000 mg | ORAL_TABLET | Freq: Every day | ORAL | Status: DC
  Filled 2013-07-04 (×2): qty 1

## 2013-07-04 MED ORDER — PANTOPRAZOLE SODIUM 40 MG PO TBEC
40.0000 mg | DELAYED_RELEASE_TABLET | Freq: Every day | ORAL | Status: DC
  Administered 2013-07-05: 40 mg via ORAL

## 2013-07-04 MED ORDER — ACETAMINOPHEN 325 MG PO TABS: 325.0000 mg | ORAL_TABLET | ORAL | Status: DC | PRN

## 2013-07-04 MED ORDER — COLESEVELAM HCL 625 MG PO TABS
1875.0000 mg | ORAL_TABLET | Freq: Two times a day (BID) | ORAL | Status: DC
  Administered 2013-07-04 – 2013-07-05 (×2): 1875 mg via ORAL
  Filled 2013-07-04 (×4): qty 3

## 2013-07-04 NOTE — H&P (View-Only) (Signed)
ELECTROPHYSIOLOGY CONSULT NOTE  Patient ID: Ashley Nguyen, MRN: [id number], DOB/AGE: [birth year] 78 y.o. Admit date: (Not on file) Date of Consult: 06/22/2013  Primary Physician: Lynne Logan, MD Primary Cardiologist: [place]  Chief Complaint: Tachybradycardia syndrome   HPI Ashley Nguyen is a 80 y.o. female  Referred for consideration of pacing.  She has a remote history of a TIA. She was noted following knee surgery have atrial fibrillation with a rapid rate that persisted on her number of days. These episodes were paroxysmal. They're associated with few symptoms. Low-dose beta blockers were undertaken up titration limited by variable blood pressure. An outpatient event recorder demonstrated AV block functional rates in the 20s.  She is not very ambulatory because of her knees.  She has had problems with episodic dizziness that lasts 5-30 seconds or so. She has had falls for which he does not have explanation.  She is hypertension but does not have diabetes. Her CHADS-VASc score is 6. Echocardiogram 4/15 demonstrated normal LV function and "mild-moderate LAE dilatation" despite the fact that the quantitative measurements were normal.          Past Medical History  Diagnosis Date  . Hypertension   . GERD (gastroesophageal reflux disease)   . Hyperlipidemia   . Cataracts, bilateral   . Impaired hearing   . TIA (transient ischemic attack) 2yrs ago  . Seasonal allergies   . History of migraine     last time well over a month ago  . Vertigo   . History of gastric ulcer   . History of GI bleed   . History of colon polyps   . History of kidney stones   . Anemia     at times has been on iron supplements  . Diabetes mellitus without complication     borderline  . PAF (paroxysmal atrial fibrillation)     a. 04/2013 periop L TKA.  . Osteoarthritis     a. 04/2013 s/p L TKA.      Surgical History:  Past Surgical History  Procedure Laterality Date  .  Tonsillectomy      adenoidectomy  . Tubal ligation    . Knee arthroscopy Left   . Thumb surgery Right   . Appendectomy      as a child  . Esophagogastroduodenoscopy    . Colonoscopy    . Total knee arthroplasty Left 04/25/2013    Procedure: TOTAL KNEE ARTHROPLASTY;  Surgeon: Renette Butters, MD;  Location: [facility];  Service: Orthopedics;  Laterality: Left;     Home Meds: Prior to Admission medications   Medication Sig Start Date End Date Taking? Authorizing Provider  colesevelam (WELCHOL) 625 MG tablet Take 625 mg by mouth 2 (two) times daily with a meal.   Yes Historical Provider, MD  cycloSPORINE (RESTASIS) 0.05 % ophthalmic emulsion Place 1 drop into both eyes 2 (two) times daily.   Yes Historical Provider, MD  esomeprazole (NEXIUM) 40 MG capsule Take 40 mg by mouth daily.   Yes Historical Provider, MD  fluticasone (FLONASE) 50 MCG/ACT nasal spray Place 1 spray into the nose daily.   Yes Historical Provider, MD  lisinopril-hydrochlorothiazide (PRINZIDE,ZESTORETIC) 20-25 MG per tablet Take 1 tablet by mouth daily.   Yes Historical Provider, MD  metoprolol (LOPRESSOR) 50 MG tablet Take 25 mg by mouth 2 (two) times daily. 1/2 tablet twice a day 04/28/13  Yes Rogelia Mire, NP  rivaroxaban (XARELTO) 20 MG TABS tablet Take 20 mg by mouth daily with supper.  Yes Historical Provider, MD     Allergies:  Allergies  Allergen Reactions  . Clams [Shellfish Allergy] Nausea And Vomiting    Per pt, NOT a general shellfish allergy.  Clams only.  . Desipramine Hcl Other (See Comments)    "painful urination, then kidneys shut down for hours."  . Lipitor [Atorvastatin] Other (See Comments)    Muscle aches.  . Plavix [Clopidogrel Bisulfate] Other (See Comments)    "Almost bled to death."  . Sulfa Antibiotics Hives  . Verapamil Swelling  . Penicillins Hives    History   Social History  . Marital Status: Married    Spouse Name: N/A    Number of Children: N/A  . Years of Education: N/A    Occupational History  . Not on file.   Social History Main Topics  . Smoking status: Never Smoker   . Smokeless tobacco: Never Used  . Alcohol Use: No  . Drug Use: No  . Sexual Activity: Yes    Birth Control/ Protection: Post-menopausal   Other Topics Concern  . Not on file   Social History Narrative   M[employer]on           No family history on file.   ROS:  Please see the history of present illness.     All other systems reviewed and negative.    Physical Exam: Blood pressure 145/83, pulse 66, height 5\' 5"  (1.651 m), weight 206 lb (93.441 kg). General: Well developed, well nourished obesity female in no acute distress. Head: Normocephalic, atraumatic, sclera non-icteric, no xanthomas, nares are without discharge. EENT: normal Lymph Nodes:  none Back: without scoliosis/kyphosis no CVA tendersness Neck: Negative for carotid bruits. JVD 8-10 Lungs: Clear bilaterally to auscultation without wheezes, rales, or rhonchi. Breathing is unlabored. Heart: RRR with S1 S2. No  murmur , rubs, or gallops appreciated. Abdomen: Soft, non-tender, non-distended with normoactive bowel sounds. No hepatomegaly. No rebound/guarding. No obvious abdominal masses. Msk:  Strength and tone appear normal for age. Extremities: No clubbing or cyanosis. Tr edema.  Distal pedal pulses are 2+ and equal bilaterally. Skin: Warm and Dry Neuro: Alert and oriented X 3. CN III-XII intact Grossly normal sensory and motor function . Psych:  Responds to questions appropriately with a normal affect.      Labs: Cardiac Enzymes No results found for this basename: CKTOTAL, CKMB, TROPONINI,  in the last 72 hours CBC Lab Results  Component Value Date   WBC 9.6 04/26/2013   HGB 11.1* 04/26/2013   HCT 32.9* 04/26/2013   MCV 91.6 04/26/2013   PLT 249 04/26/2013   PROTIME: No results found for this basename: LABPROT, INR,  in the last 72 hours Chemistry No results found for this basename: NA, K, CL, CO2, BUN, CREATININE,  CALCIUM, LABALBU, PROT, BILITOT, ALKPHOS, ALT, AST, GLUCOSE,  in the last 168 hours Lipids No results found for this basename: CHOL, HDL, LDLCALC, TRIG   BNP No results found for this basename: probnp   Miscellaneous No results found for this basename: DDIMER    Radiology/Studies:  No results found.  EKG:  Sinus rhythm 64 Intervals 18/08/42 Otherwise normal  Telemetry was reviewed from the hospital atrial fibrillation at rates of 140-150  Telemetry report described by Dr. Wynonia Lawman includes 2-1 heart block and hours while awake with rates in the high 20s--reviewed agree  Assessment and Plan:  Atrial fibrillation with a rapid ventricular response-paroxysmal  2-1 heart block  Presyncope/falls  Daytime somnolence and obstructive nocturnal breathing  The patient  has tachybradycardia syndrome with heart rates ranging from the high 20s to low 140s. Anti bradycardia pacing is indicated especially in light of her falls and presyncope areas The benefits and risks were reviewed including but not limited to death,  perforation, infection, lead dislodgement and device malfunction.  The patient understands agrees and is willing to proceed.  Control of her heart rates will be further challenged by her tendency towards hypotension.  She also significant daytime somnolence and obstructive nocturnal breathing; with her atrial fibrillation I think given valuable to undertake a sleep study. I will defer this to Dr. Benay Pillow

## 2013-07-04 NOTE — CV Procedure (Signed)
Preop DX:: tachybrady  syndrome Post op DX:: same  Procedure  dual pacemaker MRI COMPATIBLE implantation  After routine prep and drape, lidocaine was infiltrated in the prepectoral subclavicular region on the left side an incision was made and carried down to later the prepectoral fascia using electrocautery and sharp dissection a pocket was formed similarly. Hemostasis was obtained.  After this, we turned our attention to gaining accessm to the extrathoracic,left subclavian vein. This was accomplished without difficulty and without the aspiration of air or puncture of the artery. 2 separate venipunctures were accomplished; guidewires were placed and retained and sequentially 7 French sheath through which were  passed an Medtronic 5076 ventricular lead serial [id number] and an Medtronic 5076 atrial lead serial number [id number] .  The ventricular lead was manipulated to the right ventricular apex with a bipolar R wave was 11.5, the pacing impedance was 1152, the threshold was 0.9 @ 0.5 msec  Current at threshold was   0.9  Ma and the current of injury was  BRISK.  The right atrial lead was manipulated to the right atrial appendage BUT multiple sites were apped before we found a location where pacing coulld be accomplished below 4 V  This was the low lateral R atrium with a bipolar P-wave  7, the pacing impedance was 886, the threshold  @ 0.5 msec   Current at threshold was 0.7  Ma and the current of injury was brisk.   The leads were affixed to the prepectoral fascia and attached to a  Medtronic  pulse generator serial number [id number] [facility]  MRI COMPATIBLE PACEMAKER.  Hemostasis was obtained. The pocket was copiously irrigated with antibiotic containing saline solution. The leads and the pulse generator were placed in the pocket and affixed to the prepectoral fascia. The wound was then closed in 2 layers in the normal fashion.  The wound was washed dried . And a dermabond adhesive was applied   Needle  Count, sponge counts and instrument counts were correct at the end of the procedure .   The patient tolerated the procedure without apparent complication.  Maryagnes Amos.D.

## 2013-07-04 NOTE — Interval H&P Note (Signed)
History and Physical Interval Note:  07/04/2013 1:02 PM  Ash[facility] Nguyen  has presented today for surgery, with the diagnosis of tachy/brady  The various methods of treatment have been discussed with the patient and family. After consideration of risks, benefits and other options for treatment, the patient has consented to  Procedure(s): PERMANENT PACEMAKER INSERTION (N/A) as a surgical intervention .  The patient's history has been reviewed, patient examined, no change in status, stable for surgery.  I have reviewed the patient's chart and labs.  Questions were answered to the patient's satisfaction.     Virl Axe

## 2013-07-04 NOTE — Interval H&P Note (Signed)
History and Physical Interval Note:  07/04/2013 12:31 PM  Ashley Nguyen  has presented today for surgery, with the diagnosis of tachy/brady  The various methods of treatment have been discussed with the patient and family. After consideration of risks, benefits and other options for treatment, the patient has consented to  Procedure(s): PERMANENT PACEMAKER INSERTION (N/A) as a surgical intervention .  The patient's history has been reviewed, patient examined, no change in status, stable for surgery.  I have reviewed the patient's chart and labs.  Questions were answered to the patient's satisfaction.     Virl Axe

## 2013-07-05 ENCOUNTER — Ambulatory Visit (HOSPITAL_COMMUNITY): Payer: Medicare Other

## 2013-07-05 DIAGNOSIS — I441 Atrioventricular block, second degree: Secondary | ICD-10-CM

## 2013-07-05 NOTE — Discharge Instructions (Signed)
° °  Supplemental Discharge Instructions for  Pacemaker/Defibrillator Patients  Activity No heavy lifting or vigorous activity with your left/right arm for 6 to 8 weeks.  Do not raise your left/right arm above your head for one week.  Gradually raise your affected arm as drawn below.           06/20                      06/21                       06/22                      06/23       NO DRIVING for 1 week; you may begin driving on [id number]. WOUND CARE   Keep the wound area clean and dry.  You may shower but no soaking in tub bath or swimming pool for 10-14 days, until wound completely healed.   The Dermabond (glue) on your wound will fall off on its own; do not pull it off.  No bandage is needed on the site.  DO NOT apply any creams, oils, or ointments to the wound area.   If you notice any drainage or discharge from the wound, any swelling or bruising at the site, or you develop a fever > 101? F after you are discharged home, call the office at once.  Special Instructions   You are still able to use cellular telephones; use the ear opposite the side where you have your pacemaker/defibrillator.  Avoid carrying your cellular phone near your device.   When traveling through airports, show security personnel your identification card to avoid being screened in the metal detectors.  Ask the security personnel to use the hand wand.   Avoid arc welding equipment, MRI testing (magnetic resonance imaging), TENS units (transcutaneous nerve stimulators).  Call the office for questions about other devices.   Avoid electrical appliances that are in poor condition or are not properly grounded.   Microwave ovens are safe to be near or to operate.

## 2013-07-05 NOTE — Discharge Summary (Signed)
ELECTROPHYSIOLOGY DISCHARGE SUMMARY   Patient ID: Ashley Nguyen,  MRN: [id number], DOB/AGE: July 24, 1932 80 y.o.  Admit date: 07/04/2013 Discharge date: 07/05/2013  Primary Care Physician: Nolon Rod, MD Primary Cardiologist: Wynonia Lawman, MD Primary EP: Caryl Comes, MD  Primary Discharge Diagnosis:  Symptomatic bradycardia with 2:1 AV block s/p PPM implantation  Secondary Discharge Diagnoses:  Paroxysmal atrial fibrillation Prior TIA Dyslipidemia Hypertension History of GI bleed Diabetes mellitus  Procedures This Admission:  Dual chamber PPM implantation Medtronic 5076 atrial lead serial number [id number] Medtronic 5076 ventricular lead serial number [id number] Medtronic pulse generator serial number [id number] [facility] MRI compatible pacemaker  History and Hospital Course:  Ashley Nguyen is a 80 year old woman with symptomatic bradycardia / 2:1 AV block who presented yesterday and underwent PPM implantation. She tolerated this procedure well without any immediate complication. She remains hemodynamically stable and afebrile. Her chest xray shows stable lead placement without pneumothorax. Her device interrogation shows normal PPM function with stable lead parameters/measurements. Her implant site is intact without significant bleeding or hematoma. She has been given discharge instructions including wound care and activity restrictions. She will follow-up in 10 days for wound check. There were no changes made to her medications. She has been seen, examined and deemed stable for discharge today by Dr. Jolyn Nap.  Physical Exam:  Vitals: Blood pressure 147/80, pulse 74, temperature 98.2 F (36.8 C), temperature source Oral, resp. rate 18, height 5\' 5"  (1.651 m), weight 205 lb 6.4 oz (93.169 kg), SpO2 96.00%.  General: Well developed, well appearing, in no acute distress. Neck: Supple. JVD not elevated. Lungs: Clear bilaterally to auscultation without wheezes, rales, or rhonchi. Breathing is  unlabored. Heart: RRR S1 S2 without murmurs, rubs, or gallops.  Abdomen: Soft, non-distended. Extremities: No clubbing or cyanosis. No edema.  Distal pedal pulses are 2+ and equal bilaterally. Neuro: Alert and oriented X 3. Moves all extremities spontaneously. No focal deficits. Skin: Left upper chest / implant site is intact. No hematoma.  Labs: Lab Results  Component Value Date   WBC 7.5 06/22/2013   HGB 12.9 06/22/2013   HCT 38.9 06/22/2013   MCV 91.5 06/22/2013   PLT 336.0 06/22/2013      Component Value Date/Time   NA 132* 06/22/2013 1112   K 3.8 06/22/2013 1112   CL 95* 06/22/2013 1112   CO2 29 06/22/2013 1112   GLUCOSE 106* 06/22/2013 1112   BUN 17 06/22/2013 1112   CREATININE 1.0 06/22/2013 1112   CALCIUM 9.8 06/22/2013 1112   GFRNONAA 50* 04/17/2013 1257   GFRAA 58* 04/17/2013 1257    Disposition:  The patient is being discharged in stable condition.  Follow-up:     Follow-up Information   Follow up with [facility] On 07/13/2013. (At 10:30 AM for wound check)    Specialty:  Cardiology   Contact information:   [address] [place] [address] [phone]      Follow up with Virl Axe, MD On 10/10/2013. (At 2:30 PM)    Specialty:  Cardiology   Contact information:   [id number] [place] [address] [phone]      Discharge Medications:    Medication List         colesevelam 625 MG tablet  Commonly known as:  WELCHOL  Take 1,875 mg by mouth 2 (two) times daily with a meal.     cycloSPORINE 0.05 % ophthalmic emulsion  Commonly known as:  RESTASIS  Place 1 drop into  both eyes 2 (two) times daily.     esomeprazole 40 MG capsule  Commonly known as:  NEXIUM  Take 40 mg by mouth daily.     fluticasone 50 MCG/ACT nasal spray  Commonly known as:  FLONASE  Place 2 sprays into both nostrils daily.     lisinopril-hydrochlorothiazide 20-25 MG per tablet  Commonly known as:  PRINZIDE,ZESTORETIC  Take 1 tablet by  mouth daily.     metoprolol 50 MG tablet  Commonly known as:  LOPRESSOR  Take 25 mg by mouth 2 (two) times daily. 1/2 tablet twice a day     rivaroxaban 20 MG Tabs tablet  Commonly known as:  XARELTO  Take 20 mg by mouth daily with supper.       Duration of Discharge Encounter: Greater than 30 minutes including physician time.  Signed, Ileene Hutchinson, PA-C 07/05/2013, 8:23 AM  instructions reviewed    followup as scheduled

## 2013-07-07 ENCOUNTER — Ambulatory Visit (HOSPITAL_COMMUNITY): Payer: Medicare Other | Admitting: Physical Therapy

## 2013-07-11 ENCOUNTER — Encounter (HOSPITAL_COMMUNITY): Payer: Self-pay | Admitting: *Deleted

## 2013-07-11 ENCOUNTER — Encounter: Payer: Self-pay | Admitting: Cardiology

## 2013-07-11 DIAGNOSIS — Z7901 Long term (current) use of anticoagulants: Secondary | ICD-10-CM | POA: Insufficient documentation

## 2013-07-11 DIAGNOSIS — Z950 Presence of cardiac pacemaker: Secondary | ICD-10-CM | POA: Insufficient documentation

## 2013-07-13 ENCOUNTER — Encounter: Payer: Self-pay | Admitting: Internal Medicine

## 2013-07-13 ENCOUNTER — Ambulatory Visit (INDEPENDENT_AMBULATORY_CARE_PROVIDER_SITE_OTHER): Payer: Medicare Other | Admitting: *Deleted

## 2013-07-13 DIAGNOSIS — I480 Paroxysmal atrial fibrillation: Secondary | ICD-10-CM

## 2013-07-13 DIAGNOSIS — I4891 Unspecified atrial fibrillation: Secondary | ICD-10-CM

## 2013-07-13 LAB — MDC_IDC_ENUM_SESS_TYPE_INCLINIC
Battery Voltage: 3.1 V
Brady Statistic AP VP Percent: 0.03 %
Brady Statistic AP VS Percent: 4.76 %
Brady Statistic AS VP Percent: 0.05 %
Brady Statistic AS VS Percent: 95.16 %
Brady Statistic RA Percent Paced: 4.79 %
Brady Statistic RV Percent Paced: 0.08 %
Date Time Interrogation Session: 20150625111953
Lead Channel Impedance Value: 380 Ohm
Lead Channel Impedance Value: 418 Ohm
Lead Channel Impedance Value: 456 Ohm
Lead Channel Impedance Value: 551 Ohm
Lead Channel Pacing Threshold Amplitude: 0.625 V
Lead Channel Pacing Threshold Amplitude: 1.625 V
Lead Channel Pacing Threshold Pulse Width: 0.4 ms
Lead Channel Pacing Threshold Pulse Width: 0.4 ms
Lead Channel Sensing Intrinsic Amplitude: 14.875 mV
Lead Channel Sensing Intrinsic Amplitude: 17.5 mV
Lead Channel Sensing Intrinsic Amplitude: 3.625 mV
Lead Channel Sensing Intrinsic Amplitude: 3.75 mV
Lead Channel Setting Pacing Amplitude: 3.5 V
Lead Channel Setting Pacing Amplitude: 3.5 V
Lead Channel Setting Pacing Pulse Width: 0.4 ms
Lead Channel Setting Sensing Sensitivity: 2.8 mV
Zone Setting Detection Interval: 350 ms
Zone Setting Detection Interval: 400 ms

## 2013-07-13 NOTE — Progress Notes (Addendum)
Wound check appointment. Wound without redness or edema. Incision edges approximated, wound well healed. Normal device function. Thresholds, sensing, and impedances consistent with implant measurements. Device programmed at 3.5V for extra safety margin until 3 month visit. Histogram distribution appropriate for patient and level of activity.  12 AT/AF episodes (0.4%)---max dur. 26 mins, Max A 316, Max V 111 + Xarelto. No high ventricular rates noted. Patient educated about wound care, arm mobility, lifting restrictions. ROV in 3 months with SK.

## 2013-10-10 ENCOUNTER — Encounter: Payer: Medicare Other | Admitting: Internal Medicine

## 2013-10-18 NOTE — Addendum Note (Signed)
Encounter addended by: Leia Alf, PT on: 10/18/2013  3:58 PM<BR>     Documentation filed: Clinical Notes, Flowsheet VN

## 2013-11-16 ENCOUNTER — Encounter: Payer: Self-pay | Admitting: *Deleted

## 2013-11-23 ENCOUNTER — Encounter: Payer: Self-pay | Admitting: Internal Medicine

## 2013-11-23 ENCOUNTER — Ambulatory Visit (INDEPENDENT_AMBULATORY_CARE_PROVIDER_SITE_OTHER): Payer: Medicare Other | Admitting: Internal Medicine

## 2013-11-23 VITALS — BP 115/72 | HR 75 | Ht 65.0 in | Wt 197.8 lb

## 2013-11-23 DIAGNOSIS — Z45018 Encounter for adjustment and management of other part of cardiac pacemaker: Secondary | ICD-10-CM

## 2013-11-23 DIAGNOSIS — Z950 Presence of cardiac pacemaker: Secondary | ICD-10-CM

## 2013-11-23 DIAGNOSIS — I441 Atrioventricular block, second degree: Secondary | ICD-10-CM

## 2013-11-23 DIAGNOSIS — I495 Sick sinus syndrome: Secondary | ICD-10-CM

## 2013-11-23 DIAGNOSIS — I480 Paroxysmal atrial fibrillation: Secondary | ICD-10-CM

## 2013-11-23 LAB — MDC_IDC_ENUM_SESS_TYPE_INCLINIC
Battery Remaining Longevity: 132 mo
Battery Voltage: 3.05 V
Brady Statistic AP VP Percent: 0.11 %
Brady Statistic AP VS Percent: 22.2 %
Brady Statistic AS VP Percent: 0.07 %
Brady Statistic AS VS Percent: 77.63 %
Brady Statistic RA Percent Paced: 22.3 %
Brady Statistic RV Percent Paced: 0.18 %
Date Time Interrogation Session: 20151105095253
Lead Channel Impedance Value: 380 Ohm
Lead Channel Impedance Value: 399 Ohm
Lead Channel Impedance Value: 532 Ohm
Lead Channel Impedance Value: 570 Ohm
Lead Channel Pacing Threshold Amplitude: 0.625 V
Lead Channel Pacing Threshold Amplitude: 1.125 V
Lead Channel Pacing Threshold Pulse Width: 0.4 ms
Lead Channel Pacing Threshold Pulse Width: 0.4 ms
Lead Channel Sensing Intrinsic Amplitude: 1.875 mV
Lead Channel Sensing Intrinsic Amplitude: 16 mV
Lead Channel Sensing Intrinsic Amplitude: 20.125 mV
Lead Channel Sensing Intrinsic Amplitude: 3.375 mV
Lead Channel Setting Pacing Amplitude: 1.5 V
Lead Channel Setting Pacing Amplitude: 2.25 V
Lead Channel Setting Pacing Pulse Width: 0.4 ms
Lead Channel Setting Sensing Sensitivity: 2.8 mV
Zone Setting Detection Interval: 350 ms
Zone Setting Detection Interval: 400 ms

## 2013-11-23 NOTE — Patient Instructions (Signed)
Your physician recommends that you continue on your current medications as directed. Please refer to the Current Medication list given to you today.  Dr. Caryl Comes will see you on an as needed basis. Dr. Wynonia Lawman will continue to monitor your device.

## 2013-11-23 NOTE — Progress Notes (Signed)
Patient Care Team: Maxine Glenn. Nolon Rod, MD as PCP - General (Family Medicine) Jacolyn Reedy, MD as Consulting Physician (Cardiology)   HPI  Ashley Nguyen is a 80 y.o. 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 This is better now than it was a month or 2 ago. There has been no warmth or erythema.  She notes that she is late for her appointment by couple of months because she spilled boiling hot soup all over her. It was exceedingly painful and healing only slowly.  She has a remote history of a TIA. She was noted following knee surgery have atrial fibrillation with a rapid rate that persisted on her number of days. These episodes were paroxysmal. They're associated with few symptoms. Low-dose beta blockers were undertaken up titration limited by variable blood pressure. An outpatient event recorder demonstrated AV block functional rates in the 20s..  She is hypertension but does not have diabetes. Her CHADS-VASc score is 6. Echocardiogram 4/15 demonstrated normal LV function and "mild-moderate LAE dilatation" despite the fact that the quantitative measurements were normal.  Past Medical History  Diagnosis Date  . Hypertension   . GERD (gastroesophageal reflux disease)   . Hyperlipidemia   . Cataracts, bilateral   . Impaired hearing   . TIA (transient ischemic attack) ~ 2002  . Seasonal allergies   . Vertigo   . History of gastric ulcer   . History of GI bleed   . History of colon polyps   . PAF (paroxysmal atrial fibrillation)     a. 04/2013 periop L TKA.  . Borderline diabetes   . Iron deficiency anemia     at times has been on iron supplements  . History of blood transfusion     "after taking Plavix; developed bleeding ulcer; had to have transfusions"  . Migraine     "none in 2015; used to come quite frequently" (07/04/2013)  . Headache     "@  least monthly" (07/04/2013)  . Osteoarthritis     a. 04/2013 s/p L TKA.  [name] Arthritis     "hands, feet, knees, left hip" (07/04/2013)  . Kidney stones     "passed them"  . Skin cancer     "left cheek"   . Symptomatic bradycardia     Past Surgical History  Procedure Laterality Date  . Knee arthroscopy Left 11/2012  . Thumb surgery Right 1980's    "made it usable"  . Appendectomy      as a child  . Esophagogastroduodenoscopy    . Colonoscopy    . Total knee arthroplasty Left 04/25/2013    Procedure: TOTAL KNEE ARTHROPLASTY;  Surgeon: Renette Butters, MD;  Location: [place];  Service: Orthopedics;  Laterality: Left;  . Pacemaker insertion  07/04/2013    MDT dual chamber Advisa pacemaker implanted by Dr Caryl Comes for 2:1 AV block  . Tonsillectomy and adenoidectomy  ~ 1942  . Tubal ligation  ~ 1970  . Skin cancer excision Left ~ 2001    "cheek"    Current Outpatient Prescriptions  Medication Sig Dispense Refill  . colesevelam (WELCHOL) 625 MG tablet Take 1,875 mg by mouth 2 (two) times daily with a meal.     . cycloSPORINE (RESTASIS) 0.05 % ophthalmic emulsion Place 1 drop into both eyes 2 (two) times daily.    [name] esomeprazole (NEXIUM) 40 MG capsule Take 40  mg by mouth daily.    . fluticasone (FLONASE) 50 MCG/ACT nasal spray Place 2 sprays into both nostrils daily.     [name] lisinopril-hydrochlorothiazide (PRINZIDE,ZESTORETIC) 20-25 MG per tablet Take 1 tablet by mouth daily.    . metoprolol (LOPRESSOR) 50 MG tablet Take 25 mg by mouth 2 (two) times daily. 1/2 tablet twice a day    . rivaroxaban (XARELTO) 20 MG TABS tablet Take 20 mg by mouth daily with supper.     No current facility-administered medications for this visit.    Allergies  Allergen Reactions  . Desipramine Hcl Other (See Comments)    "painful urination, then kidneys shut down for hours."  . Plavix [Clopidogrel Bisulfate] Other (See Comments)    "Almost bled to death."  . Statins Other (See Comments)    Extreme pain in  legs with some swelling  . Clams [Shellfish Allergy] Nausea And Vomiting    Per pt, NOT a general shellfish allergy.  Clams only.  . Lipitor [Atorvastatin] Other (See Comments)    Muscle aches.  . Penicillins Hives  . Sulfa Antibiotics Hives  . Verapamil Swelling    Review of Systems negative except from HPI and PMH  Physical Exam BP 115/72 mmHg  Pulse 75  Ht 5\' 5"  (1.651 m)  Wt 197 lb 12.8 oz (89.721 kg)  BMI 32.92 kg/m2 Well developed and well nourished in no acute distress HENT normal E scleral and icterus clear Neck Supple JVP flat; carotids brisk and full Clear to ausculation Device pocket well healed; without hematoma or erythema.  There is no tethering Regular rate and rhythm, no murmurs gallops or rub Soft with active bowel sounds No clubbing cyanosis  Edema Alert and oriented, grossly normal motor and sensory function Skin Warm and Dry    Assessment and  Plan  Second-degree heart block-intermittent  Atrial fibrillation -paroxysmal  Pacemaker-Medtronic  Her device was reprogrammed to maximize longevity. Her burdens of atrial fibrillation seemed to be diminishing. The discomfort at her pocket does not seem to be associated with evidence of infection. He will follow up with Dr. Viona Gilmore ST we will see her again as needed

## 2013-12-28 ENCOUNTER — Encounter (HOSPITAL_COMMUNITY): Payer: Self-pay | Admitting: Internal Medicine

## 2014-09-12 ENCOUNTER — Other Ambulatory Visit: Payer: Self-pay | Admitting: Orthopedic Surgery

## 2014-09-12 ENCOUNTER — Ambulatory Visit (HOSPITAL_COMMUNITY)
Admission: RE | Admit: 2014-09-12 | Discharge: 2014-09-12 | Disposition: A | Discharge status: Home / Self Care | Payer: Medicare Other | Source: Ambulatory Visit | Attending: Cardiology | Admitting: Cardiology

## 2014-09-12 DIAGNOSIS — E785 Hyperlipidemia, unspecified: Secondary | ICD-10-CM | POA: Diagnosis not present

## 2014-09-12 DIAGNOSIS — M79662 Pain in left lower leg: Secondary | ICD-10-CM

## 2014-09-12 DIAGNOSIS — I10 Essential (primary) hypertension: Secondary | ICD-10-CM | POA: Diagnosis not present

## 2014-09-12 DIAGNOSIS — M7989 Other specified soft tissue disorders: Secondary | ICD-10-CM | POA: Diagnosis not present

## 2014-09-12 DIAGNOSIS — E119 Type 2 diabetes mellitus without complications: Secondary | ICD-10-CM | POA: Diagnosis not present

## 2014-09-12 DIAGNOSIS — M79605 Pain in left leg: Secondary | ICD-10-CM

## 2015-05-28 DIAGNOSIS — Z9229 Personal history of other drug therapy: Secondary | ICD-10-CM | POA: Insufficient documentation

## 2015-05-28 DIAGNOSIS — K219 Gastro-esophageal reflux disease without esophagitis: Secondary | ICD-10-CM | POA: Diagnosis present

## 2015-05-28 DIAGNOSIS — I10 Essential (primary) hypertension: Secondary | ICD-10-CM | POA: Diagnosis present

## 2015-05-28 DIAGNOSIS — E785 Hyperlipidemia, unspecified: Secondary | ICD-10-CM | POA: Diagnosis present

## 2015-05-28 DIAGNOSIS — N184 Chronic kidney disease, stage 4 (severe): Secondary | ICD-10-CM | POA: Diagnosis present

## 2015-05-28 DIAGNOSIS — Z950 Presence of cardiac pacemaker: Secondary | ICD-10-CM | POA: Insufficient documentation

## 2015-08-28 DIAGNOSIS — H903 Sensorineural hearing loss, bilateral: Secondary | ICD-10-CM | POA: Insufficient documentation

## 2016-02-22 DIAGNOSIS — L84 Corns and callosities: Secondary | ICD-10-CM | POA: Insufficient documentation

## 2016-03-10 IMAGING — CR DG KNEE 1-2V*L*
2 series · 2 of 2 positions shown · non-contrast
Comparison: Plain films of the left knee 10/26/2012.

CLINICAL DATA: Knee replacement.

EXAM:
LEFT KNEE - 1-2 VIEW

[AP]
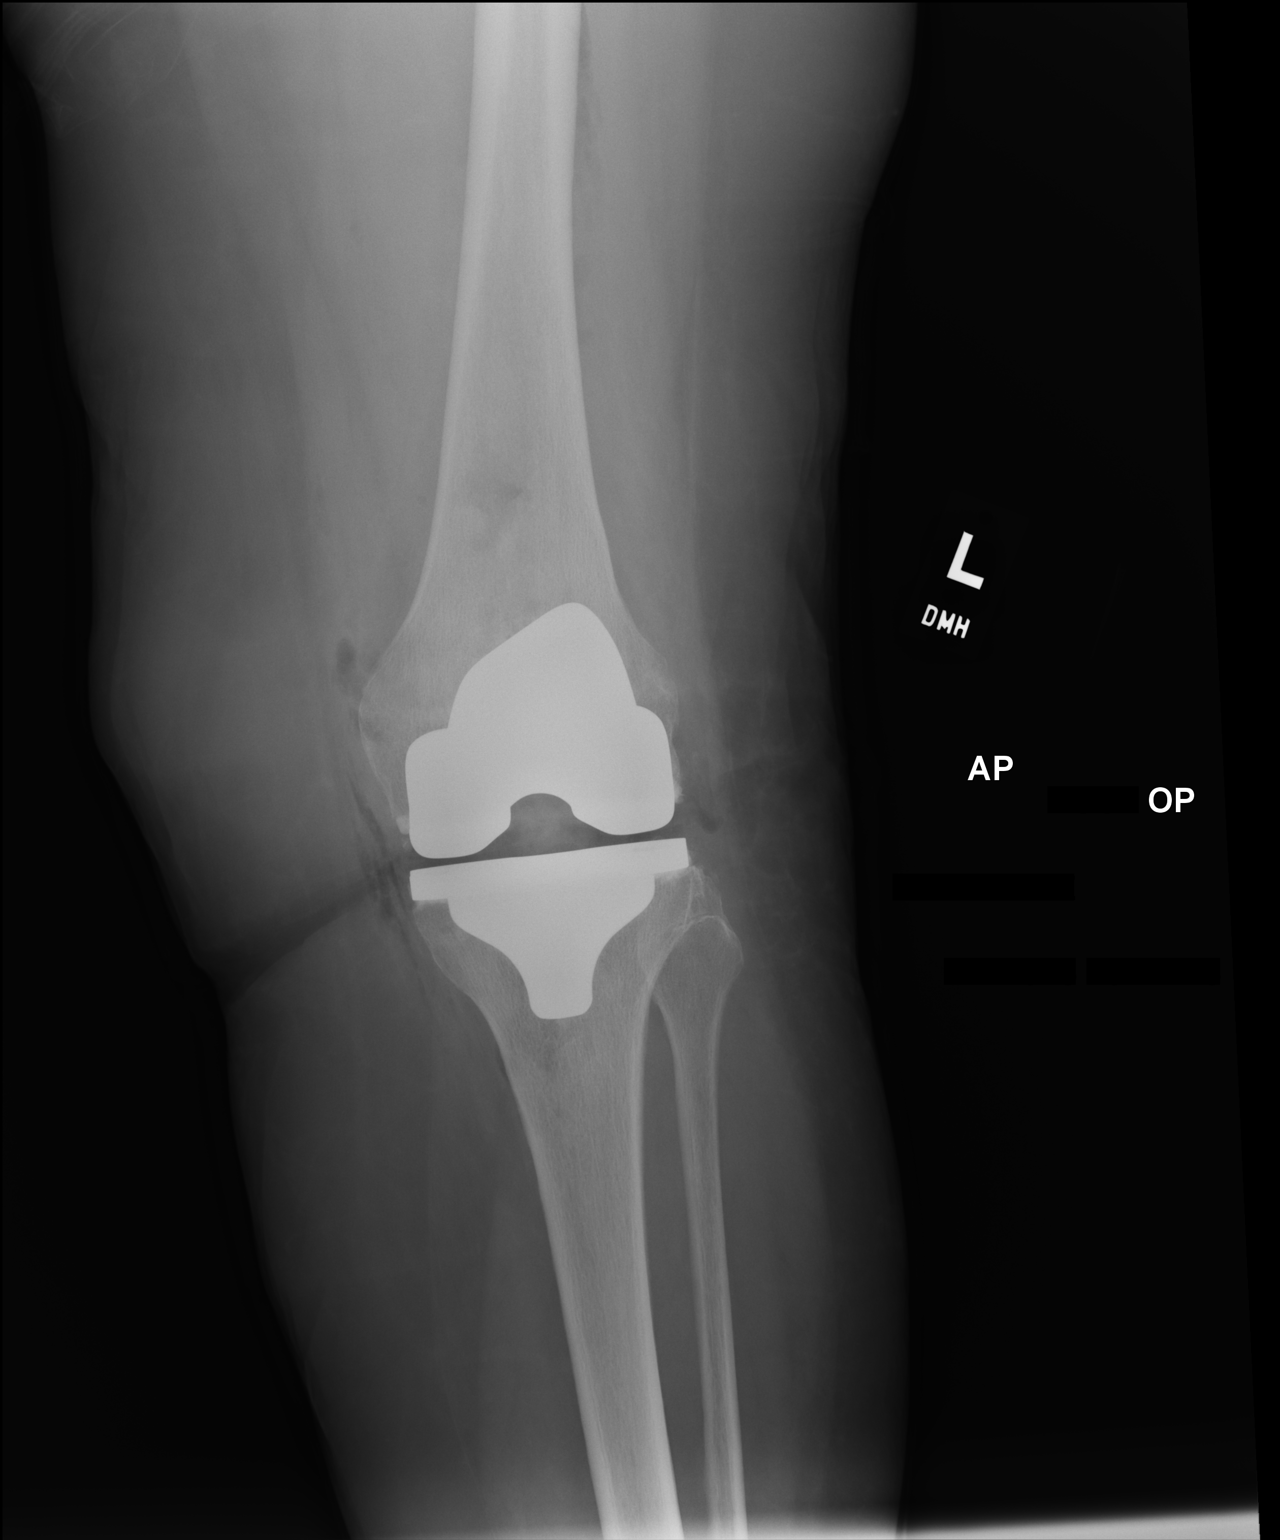

[xtable lateral]
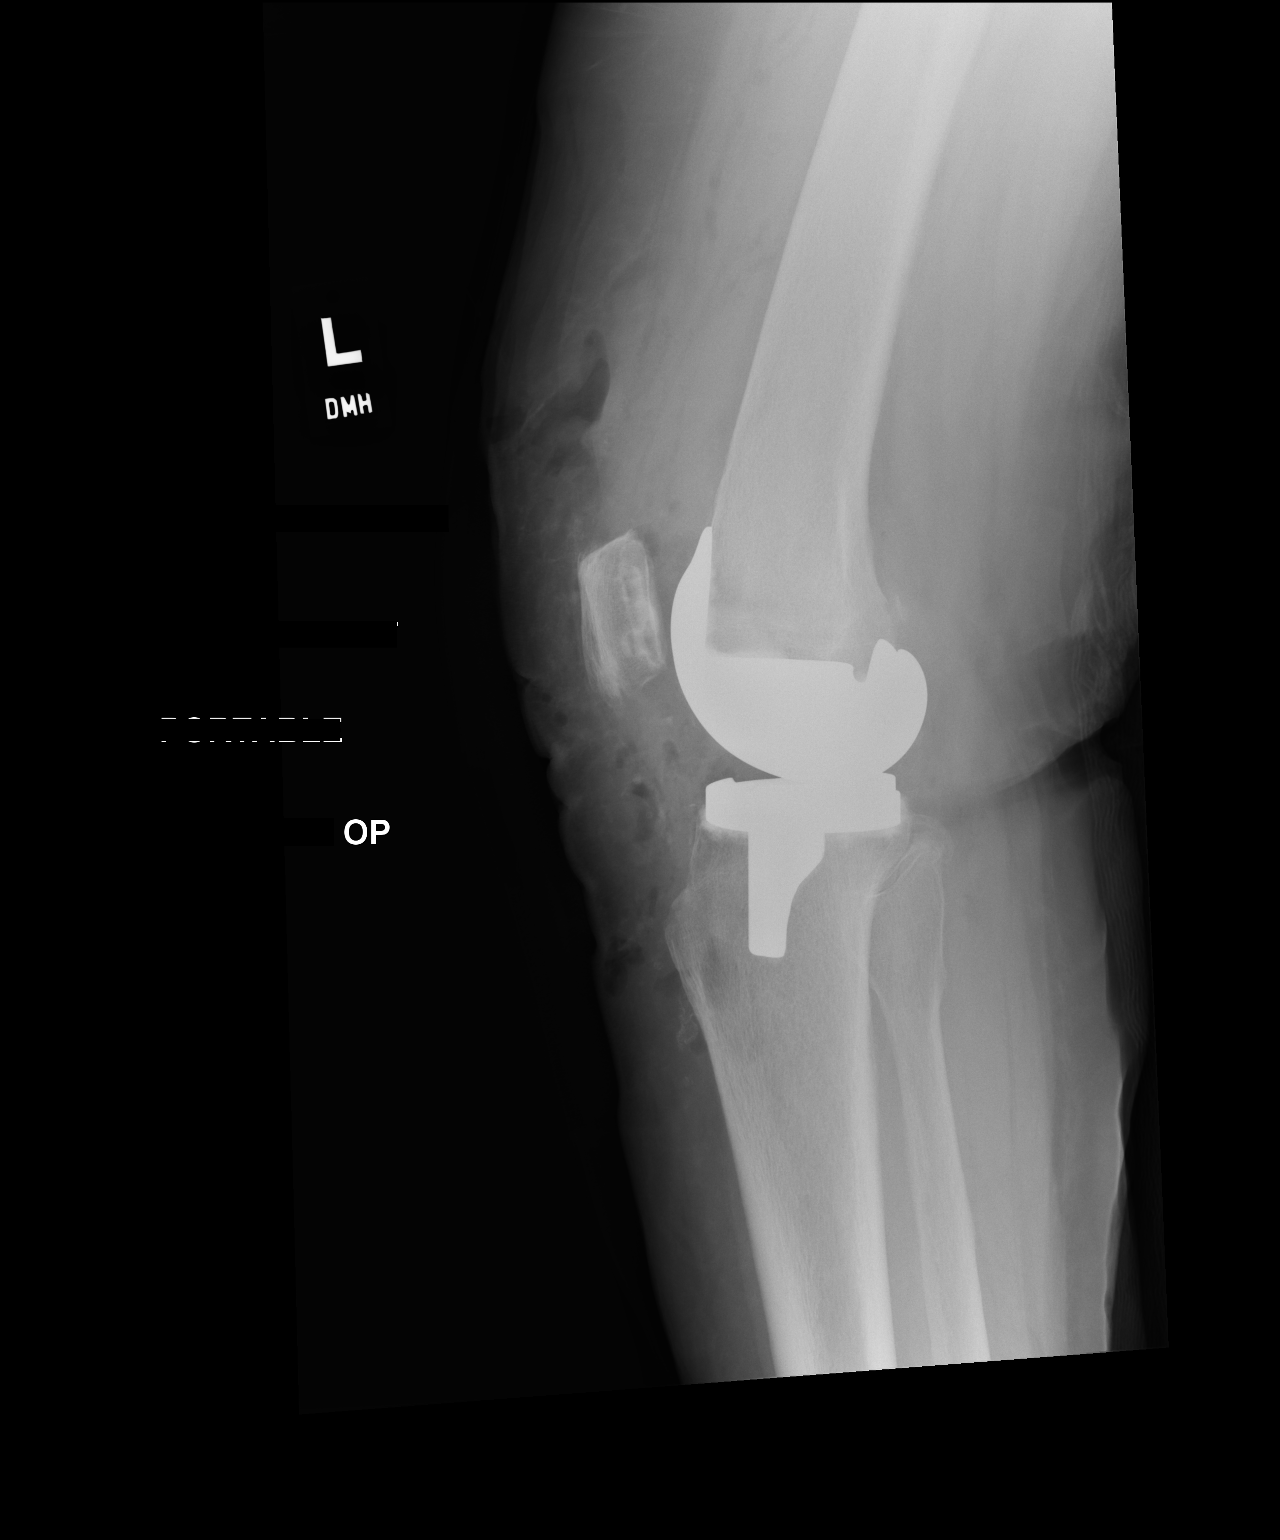

[2 of 2 positions shown; findings below may reference images not displayed]

FINDINGS: The patient has a new left total knee arthroplasty. The device is
located and there is no fracture. Gas in the soft tissue from
surgery is identified.
IMPRESSION: Left total knee replacement.  No acute abnormality.

## 2017-10-21 ENCOUNTER — Ambulatory Visit (INDEPENDENT_AMBULATORY_CARE_PROVIDER_SITE_OTHER): Payer: Medicare Other | Admitting: *Deleted

## 2017-10-21 DIAGNOSIS — I495 Sick sinus syndrome: Secondary | ICD-10-CM

## 2017-10-21 NOTE — Progress Notes (Signed)
Remote pacemaker transmission.   

## 2017-10-30 LAB — CUP PACEART REMOTE DEVICE CHECK
Battery Remaining Longevity: 86 mo
Battery Voltage: 3.01 V
Brady Statistic AP VP Percent: 0.01 %
Brady Statistic AP VS Percent: 15.69 %
Brady Statistic AS VP Percent: 0.03 %
Brady Statistic AS VS Percent: 84.27 %
Brady Statistic RA Percent Paced: 15.7 %
Brady Statistic RV Percent Paced: 0.04 %
Date Time Interrogation Session: 20191003130434
Implantable Lead Implant Date: 20150616
Implantable Lead Implant Date: 20150616
Implantable Lead Location: 753859
Implantable Lead Location: 753860
Implantable Lead Model: 5076
Implantable Lead Model: 5076
Implantable Lead Serial Number: 3746147
Implantable Pulse Generator Implant Date: 20150616
Lead Channel Impedance Value: 361 Ohm
Lead Channel Impedance Value: 380 Ohm
Lead Channel Impedance Value: 475 Ohm
Lead Channel Impedance Value: 513 Ohm
Lead Channel Pacing Threshold Amplitude: 0.75 V
Lead Channel Pacing Threshold Amplitude: 1.125 V
Lead Channel Pacing Threshold Pulse Width: 0.4 ms
Lead Channel Pacing Threshold Pulse Width: 0.4 ms
Lead Channel Sensing Intrinsic Amplitude: 15.875 mV
Lead Channel Sensing Intrinsic Amplitude: 15.875 mV
Lead Channel Sensing Intrinsic Amplitude: 2.125 mV
Lead Channel Sensing Intrinsic Amplitude: 2.125 mV
Lead Channel Setting Pacing Amplitude: 1.5 V
Lead Channel Setting Pacing Amplitude: 2.25 V
Lead Channel Setting Pacing Pulse Width: 0.4 ms
Lead Channel Setting Sensing Sensitivity: 2.8 mV

## 2017-12-07 NOTE — Progress Notes (Signed)
Cardiology Office Note:    Date:  12/08/2017   ID:  Ashley Anne Mesler, Alferd Apa 1932-12-30, MRN [id number]  PCP:  Herbert Pun., MD (Inactive)  Cardiologist:  Shirlee More, MD    Referring MD: No ref. provider found    ASSESSMENT:    1. PAF (paroxysmal atrial fibrillation) (HCC)   2. Cardiac pacemaker in situ   3. Essential hypertension   4. Long term current use of anticoagulant therapy    PLAN:    In order of problems listed above:  1. Stable she has a minimal AF burden less than 1 minute on her last device check I can access in July is asymptomatic in sinus rhythm today and anticoagulated.  She was symptomatic in a high burden I would not consider an antiarrhythmic drug. 2. Stable now followed in our device clinic 3. stable blood pressure target continue current treatment 4. stable continue her anticoagulant use dose with age and stage IV CKD 5. Lipidemia stable continue WelChol   Next appointment: 6 months   Medication Adjustments/Labs and Tests Ordered: Current medicines are reviewed at length with the patient today.  Concerns regarding medicines are outlined above.  Orders Placed This Encounter  Procedures  . EKG 12-Lead   No orders of the defined types were placed in this encounter.   No chief complaint on file.   History of Present Illness:    Ashley Nguyen is a 84 y.o. female with a hx of Paroxysmal atrial fibrillation,sick sinus syndrome with a  Pacemaker CKD   and hypertension last seen by Dr. Wynonia Lawman 05/21/2017 most recent lab 06/24/2016 cholesterol 187 LDL 108 HDL 40. Compliance with diet, lifestyle and medications: yes  Overall pleased with the quality of her life although she is very limited by joint pain and weakness.  Unaware of atrial fibrillation or pacemaker and has had no chest pain shortness of breath edema palpitations syncope TIA or bleeding complication of her current anticoagulant Past Medical History:  Diagnosis Date  .  Arthritis    "hands, feet, knees, left hip" (07/04/2013)  . Borderline diabetes   . Cataracts, bilateral   . GERD (gastroesophageal reflux disease)   . Headache    "@ least monthly" (07/04/2013)  . History of blood transfusion    "after taking Plavix; developed bleeding ulcer; had to have transfusions"  . History of colon polyps   . History of gastric ulcer   . History of GI bleed   . Hyperlipidemia   . Hypertension   . Impaired hearing   . Iron deficiency anemia    at times has been on iron supplements  . Kidney stones    "passed them"  . Migraine    "none in 2015; used to come quite frequently" (07/04/2013)  . Osteoarthritis    a. 04/2013 s/p L TKA.  [name] PAF (paroxysmal atrial fibrillation) ([place])    a. 04/2013 periop L TKA.  . Seasonal allergies   . Skin cancer    "left cheek"   . Symptomatic bradycardia   . TIA (transient ischemic attack) ~ 2002  . Vertigo     Past Surgical History:  Procedure Laterality Date  . APPENDECTOMY     as a child  . COLONOSCOPY    . ESOPHAGOGASTRODUODENOSCOPY    . KNEE ARTHROSCOPY Left 11/2012  . PACEMAKER INSERTION  07/04/2013   MDT dual chamber Advisa pacemaker implanted by Dr Caryl Comes for 2:1 AV block  . PERMANENT PACEMAKER INSERTION N/A 07/04/2013   Procedure: PERMANENT  PACEMAKER INSERTION;  Surgeon: Deboraha Sprang, MD;  Location: [facility];  Service: Cardiovascular;  Laterality: N/A;  . SKIN CANCER EXCISION Left ~ 2001   "cheek"  . thumb surgery Right 1980's   "made it usable"  . TONSILLECTOMY AND ADENOIDECTOMY  ~ 1942  . TOTAL KNEE ARTHROPLASTY Left 04/25/2013   Procedure: TOTAL KNEE ARTHROPLASTY;  Surgeon: Renette Butters, MD;  Location: [place];  Service: Orthopedics;  Laterality: Left;  . TUBAL LIGATION  ~ 1970    Current Medications: Current Meds  Medication Sig  . apixaban (ELIQUIS) 2.5 MG TABS tablet Take 2.5 mg by mouth 2 (two) times daily.  . Cholecalciferol 125 MCG (5000 UT) TABS Take 1 tablet by mouth daily.  . colesevelam  (WELCHOL) 625 MG tablet Take 1,875 mg by mouth 2 (two) times daily with a meal.   . fluticasone (FLONASE) 50 MCG/ACT nasal spray Place 2 sprays into both nostrils daily.   . hydrALAZINE (APRESOLINE) 25 MG tablet Take 1 tablet by mouth 2 (two) times daily.  [name] lisinopril (PRINIVIL,ZESTRIL) 20 MG tablet Take 20 mg by mouth daily.  . metoprolol tartrate (LOPRESSOR) 25 MG tablet Take 1 tablet by mouth 2 (two) times daily.  . ranitidine (ZANTAC) 150 MG tablet Take 150 mg by mouth 2 (two) times daily.     Allergies:   Desipramine hcl; Plavix [clopidogrel bisulfate]; Statins; Clindamycin; Clams [shellfish allergy]; Lipitor [atorvastatin]; Penicillins; Sulfa antibiotics; and Verapamil   Social History   Socioeconomic History  . Marital status: Married    Spouse name: Not on file  . Number of children: Not on file  . Years of education: Not on file  . Highest education level: Not on file  Occupational History  . Not on file  Social Needs  . Financial resource strain: Not on file  . Food insecurity:    Worry: Not on file    Inability: Not on file  . Transportation needs:    Medical: Not on file    Non-medical: Not on file  Tobacco Use  . Smoking status: Never Smoker  . Smokeless tobacco: Never Used  Substance and Sexual Activity  . Alcohol use: No  . Drug use: No  . Sexual activity: Not Currently    Birth control/protection: Post-menopausal  Lifestyle  . Physical activity:    Days per week: Not on file    Minutes per session: Not on file  . Stress: Not on file  Relationships  . Social connections:    Talks on phone: Not on file    Gets together: Not on file    Attends religious service: Not on file    Active member of club or organization: Not on file    Attends meetings of clubs or organizations: Not on file    Relationship status: Not on file  Other Topics Concern  . Not on file  Social History Narrative   M[employer]on           Family History: The patient's family history  includes Stroke in her father. There is no history of Heart attack, Heart disease, or Diabetes. ROS:   Please see the history of present illness.    All other systems reviewed and are negative.  EKGs/Labs/Other Studies Reviewed:    The following studies were reviewed today:  EKG:  EKG ordered today.  The ekg ordered today demonstrates Gilman normal  Recent Labs: No results found for requested labs within last 8760 hours.  Recent Lipid Panel No results  found for: CHOL, TRIG, HDL, CHOLHDL, VLDL, LDLCALC, LDLDIRECT  Physical Exam:    VS:  BP 138/72 (BP Location: Left Arm, Patient Position: Sitting, Cuff Size: Large)   Pulse 69   Ht 5' 5.5" (1.664 m)   Wt 220 lb 6.4 oz (100 kg)   SpO2 96%   BMI 36.12 kg/m     Wt Readings from Last 3 Encounters:  12/08/17 220 lb 6.4 oz (100 kg)  11/23/13 197 lb 12.8 oz (89.7 kg)  07/05/13 205 lb 6.4 oz (93.2 kg)     GEN:  Well nourished, well developed in no acute distress HEENT: Normal NECK: No JVD; No carotid bruits LYMPHATICS: No lymphadenopathy CARDIAC: RRR, no murmurs, rubs, gallops RESPIRATORY:  Clear to auscultation without rales, wheezing or rhonchi  ABDOMEN: Soft, non-tender, non-distended MUSCULOSKELETAL:  No edema; No deformity  SKIN: Warm and dry NEUROLOGIC:  Alert and oriented x 3 PSYCHIATRIC:  Normal affect    Signed, Shirlee More, MD  12/08/2017 11:07 AM    Ages

## 2017-12-08 ENCOUNTER — Encounter: Payer: Self-pay | Admitting: Cardiology

## 2017-12-08 ENCOUNTER — Ambulatory Visit (INDEPENDENT_AMBULATORY_CARE_PROVIDER_SITE_OTHER): Payer: Medicare Other | Admitting: Cardiology

## 2017-12-08 VITALS — BP 138/72 | HR 69 | Ht 65.5 in | Wt 220.4 lb

## 2017-12-08 DIAGNOSIS — Z950 Presence of cardiac pacemaker: Secondary | ICD-10-CM

## 2017-12-08 DIAGNOSIS — I10 Essential (primary) hypertension: Secondary | ICD-10-CM

## 2017-12-08 DIAGNOSIS — Z7901 Long term (current) use of anticoagulants: Secondary | ICD-10-CM

## 2017-12-08 DIAGNOSIS — I480 Paroxysmal atrial fibrillation: Secondary | ICD-10-CM

## 2017-12-08 NOTE — Patient Instructions (Signed)

## 2018-01-04 DIAGNOSIS — Z7901 Long term (current) use of anticoagulants: Secondary | ICD-10-CM | POA: Insufficient documentation

## 2018-01-20 ENCOUNTER — Ambulatory Visit (INDEPENDENT_AMBULATORY_CARE_PROVIDER_SITE_OTHER): Payer: Medicare Other

## 2018-01-20 DIAGNOSIS — I495 Sick sinus syndrome: Secondary | ICD-10-CM | POA: Diagnosis not present

## 2018-01-20 LAB — CUP PACEART REMOTE DEVICE CHECK
Battery Remaining Longevity: 85 mo
Battery Voltage: 3.01 V
Brady Statistic AP VP Percent: 0.01 %
Brady Statistic AP VS Percent: 13.5 %
Brady Statistic AS VP Percent: 0.03 %
Brady Statistic AS VS Percent: 86.46 %
Brady Statistic RA Percent Paced: 13.51 %
Brady Statistic RV Percent Paced: 0.04 %
Date Time Interrogation Session: 20200102171345
Implantable Lead Implant Date: 20150616
Implantable Lead Implant Date: 20150616
Implantable Lead Location: 753859
Implantable Lead Location: 753860
Implantable Lead Model: 5076
Implantable Lead Model: 5076
Implantable Lead Serial Number: 3746147
Implantable Pulse Generator Implant Date: 20150616
Lead Channel Impedance Value: 361 Ohm
Lead Channel Impedance Value: 399 Ohm
Lead Channel Impedance Value: 494 Ohm
Lead Channel Impedance Value: 532 Ohm
Lead Channel Pacing Threshold Amplitude: 0.75 V
Lead Channel Pacing Threshold Amplitude: 1.125 V
Lead Channel Pacing Threshold Pulse Width: 0.4 ms
Lead Channel Pacing Threshold Pulse Width: 0.4 ms
Lead Channel Sensing Intrinsic Amplitude: 17.375 mV
Lead Channel Sensing Intrinsic Amplitude: 17.375 mV
Lead Channel Sensing Intrinsic Amplitude: 2.5 mV
Lead Channel Sensing Intrinsic Amplitude: 2.5 mV
Lead Channel Setting Pacing Amplitude: 1.5 V
Lead Channel Setting Pacing Amplitude: 2.25 V
Lead Channel Setting Pacing Pulse Width: 0.4 ms
Lead Channel Setting Sensing Sensitivity: 2.8 mV

## 2018-01-21 NOTE — Progress Notes (Signed)
Remote pacemaker transmission.   

## 2018-04-21 ENCOUNTER — Other Ambulatory Visit: Payer: Self-pay

## 2018-04-21 ENCOUNTER — Ambulatory Visit (INDEPENDENT_AMBULATORY_CARE_PROVIDER_SITE_OTHER): Payer: Medicare Other | Admitting: *Deleted

## 2018-04-21 DIAGNOSIS — I495 Sick sinus syndrome: Secondary | ICD-10-CM | POA: Diagnosis not present

## 2018-04-21 LAB — CUP PACEART REMOTE DEVICE CHECK
Battery Remaining Longevity: 76 mo
Battery Voltage: 3.01 V
Brady Statistic AP VP Percent: 0.01 %
Brady Statistic AP VS Percent: 8.83 %
Brady Statistic AS VP Percent: 0.03 %
Brady Statistic AS VS Percent: 91.12 %
Brady Statistic RA Percent Paced: 8.84 %
Brady Statistic RV Percent Paced: 0.04 %
Date Time Interrogation Session: 20200402124527
Implantable Lead Implant Date: 20150616
Implantable Lead Implant Date: 20150616
Implantable Lead Location: 753859
Implantable Lead Location: 753860
Implantable Lead Model: 5076
Implantable Lead Model: 5076
Implantable Lead Serial Number: 3746147
Implantable Pulse Generator Implant Date: 20150616
Lead Channel Impedance Value: 361 Ohm
Lead Channel Impedance Value: 361 Ohm
Lead Channel Impedance Value: 513 Ohm
Lead Channel Impedance Value: 589 Ohm
Lead Channel Pacing Threshold Amplitude: 0.75 V
Lead Channel Pacing Threshold Amplitude: 0.875 V
Lead Channel Pacing Threshold Pulse Width: 0.4 ms
Lead Channel Pacing Threshold Pulse Width: 0.4 ms
Lead Channel Sensing Intrinsic Amplitude: 15.25 mV
Lead Channel Sensing Intrinsic Amplitude: 15.25 mV
Lead Channel Sensing Intrinsic Amplitude: 2.125 mV
Lead Channel Sensing Intrinsic Amplitude: 2.125 mV
Lead Channel Setting Pacing Amplitude: 1.5 V
Lead Channel Setting Pacing Amplitude: 2 V
Lead Channel Setting Pacing Pulse Width: 0.4 ms
Lead Channel Setting Sensing Sensitivity: 2.8 mV

## 2018-04-29 ENCOUNTER — Encounter: Payer: Self-pay | Admitting: Cardiology

## 2018-04-29 NOTE — Progress Notes (Signed)
Remote pacemaker transmission.   

## 2018-06-01 ENCOUNTER — Telehealth: Payer: Self-pay | Admitting: *Deleted

## 2018-06-01 NOTE — Telephone Encounter (Signed)
Pt contacted per Dr Harl Bowie. History reviewed. No symptoms to suggest any unstable cardiac conditions. Based on discussion, with current pandemic situation, we have postponed 06/06/18 appointment until [place]. If symptoms change, pt has been instructed to contact our office. Pt declined telehealth appt at this time.

## 2018-06-06 ENCOUNTER — Ambulatory Visit: Payer: Medicare Other | Admitting: Cardiology

## 2018-07-21 ENCOUNTER — Ambulatory Visit (INDEPENDENT_AMBULATORY_CARE_PROVIDER_SITE_OTHER): Payer: Medicare Other | Admitting: *Deleted

## 2018-07-21 DIAGNOSIS — I495 Sick sinus syndrome: Secondary | ICD-10-CM

## 2018-07-21 LAB — CUP PACEART REMOTE DEVICE CHECK
Battery Remaining Longevity: 77 mo
Battery Voltage: 3.01 V
Brady Statistic AP VP Percent: 0.01 %
Brady Statistic AP VS Percent: 8.28 %
Brady Statistic AS VP Percent: 0.03 %
Brady Statistic AS VS Percent: 91.68 %
Brady Statistic RA Percent Paced: 8.28 %
Brady Statistic RV Percent Paced: 0.04 %
Date Time Interrogation Session: 20200702121343
Implantable Lead Implant Date: 20150616
Implantable Lead Implant Date: 20150616
Implantable Lead Location: 753859
Implantable Lead Location: 753860
Implantable Lead Model: 5076
Implantable Lead Model: 5076
Implantable Lead Serial Number: 3746147
Implantable Pulse Generator Implant Date: 20150616
Lead Channel Impedance Value: 361 Ohm
Lead Channel Impedance Value: 361 Ohm
Lead Channel Impedance Value: 456 Ohm
Lead Channel Impedance Value: 494 Ohm
Lead Channel Pacing Threshold Amplitude: 0.625 V
Lead Channel Pacing Threshold Amplitude: 1 V
Lead Channel Pacing Threshold Pulse Width: 0.4 ms
Lead Channel Pacing Threshold Pulse Width: 0.4 ms
Lead Channel Sensing Intrinsic Amplitude: 16 mV
Lead Channel Sensing Intrinsic Amplitude: 2.375 mV
Lead Channel Setting Pacing Amplitude: 1.5 V
Lead Channel Setting Pacing Amplitude: 2 V
Lead Channel Setting Pacing Pulse Width: 0.4 ms
Lead Channel Setting Sensing Sensitivity: 2.8 mV

## 2018-07-29 ENCOUNTER — Encounter: Payer: Self-pay | Admitting: Cardiology

## 2018-07-29 NOTE — Progress Notes (Signed)
Remote pacemaker transmission.   

## 2018-08-29 ENCOUNTER — Telehealth: Payer: Self-pay | Admitting: *Deleted

## 2018-08-29 ENCOUNTER — Telehealth (INDEPENDENT_AMBULATORY_CARE_PROVIDER_SITE_OTHER): Payer: Medicare Other | Admitting: Cardiology

## 2018-08-29 ENCOUNTER — Encounter: Payer: Self-pay | Admitting: Cardiology

## 2018-08-29 ENCOUNTER — Encounter: Payer: Self-pay | Admitting: *Deleted

## 2018-08-29 VITALS — Ht 65.5 in | Wt 220.0 lb

## 2018-08-29 DIAGNOSIS — I480 Paroxysmal atrial fibrillation: Secondary | ICD-10-CM | POA: Diagnosis not present

## 2018-08-29 DIAGNOSIS — I10 Essential (primary) hypertension: Secondary | ICD-10-CM

## 2018-08-29 DIAGNOSIS — R001 Bradycardia, unspecified: Secondary | ICD-10-CM | POA: Diagnosis not present

## 2018-08-29 NOTE — Progress Notes (Signed)
Virtual Visit via Telephone Note   This visit type was conducted due to national recommendations for restrictions regarding the COVID-19 Pandemic (e.g. social distancing) in an effort to limit this patient's exposure and mitigate transmission in our community.  Due to her co-morbid illnesses, this patient is at least at moderate risk for complications without adequate follow up.  This format is felt to be most appropriate for this patient at this time.  The patient did not have access to video technology/had technical difficulties with video requiring transitioning to audio format only (telephone).  All issues noted in this document were discussed and addressed.  No physical exam could be performed with this format.  Please refer to the patient's chart for her  consent to telehealth for [facility].   Date:  08/29/2018   ID:  Ashley Nguyen, DOB [birth year], MRN [id number]  Patient Location: Home Provider Location: Office  PCP:  Aletha Halim., PA-C  Cardiologist:  Carlyle Dolly, MD  Electrophysiologist:  None   Evaluation Performed:  Follow-Up Visit  Chief Complaint:  Follow up visit  History of Present Illness:    Ashley Nguyen is a 85 y.o. female last seen by Dr Bettina Gavia, this is our first visit together. Seen today for the following medical problems.    1. PAF - no recent palpitations - no significant afib by check 07/2018 - compliatn with eliquis  2. Pacemaker - no recent symptoms - normal device check 07/2018  3. HTN - compliant withmeds  4. CKD IV   The patient does not have symptoms concerning for COVID-19 infection (fever, chills, cough, or new shortness of breath).    Past Medical History:  Diagnosis Date  . Arthritis    "hands, feet, knees, left hip" (07/04/2013)  . Borderline diabetes   . Cataracts, bilateral   . GERD (gastroesophageal reflux disease)   . Headache    "@ least monthly" (07/04/2013)  . History of blood transfusion    "after taking Plavix; developed bleeding ulcer; had to have transfusions"  . History of colon polyps   . History of gastric ulcer   . History of GI bleed   . Hyperlipidemia   . Hypertension   . Impaired hearing   . Iron deficiency anemia    at times has been on iron supplements  . Kidney stones    "passed them"  . Migraine    "none in 2015; used to come quite frequently" (07/04/2013)  . Osteoarthritis    a. 04/2013 s/p L TKA.  [name] PAF (paroxysmal atrial fibrillation) ([place])    a. 04/2013 periop L TKA.  . Seasonal allergies   . Skin cancer    "left cheek"   . Symptomatic bradycardia   . TIA (transient ischemic attack) ~ 2002  . Vertigo    Past Surgical History:  Procedure Laterality Date  . APPENDECTOMY     as a child  . COLONOSCOPY    . ESOPHAGOGASTRODUODENOSCOPY    . KNEE ARTHROSCOPY Left 11/2012  . PACEMAKER INSERTION  07/04/2013   MDT dual chamber Advisa pacemaker implanted by Dr Caryl Comes for 2:1 AV block  . PERMANENT PACEMAKER INSERTION N/A 07/04/2013   Procedure: PERMANENT PACEMAKER INSERTION;  Surgeon: Deboraha Sprang, MD;  Location: [facility] CATH LAB;  Service: Cardiovascular;  Laterality: N/A;  . SKIN CANCER EXCISION Left ~ 2001   "cheek"  . thumb surgery Right 1980's   "made it usable"  . TONSILLECTOMY AND ADENOIDECTOMY  ~ 1942  . TOTAL  KNEE ARTHROPLASTY Left 04/25/2013   Procedure: TOTAL KNEE ARTHROPLASTY;  Surgeon: Renette Butters, MD;  Location: [place];  Service: Orthopedics;  Laterality: Left;  . TUBAL LIGATION  ~ 1970     Current Meds  Medication Sig  . apixaban (ELIQUIS) 2.5 MG TABS tablet Take 2.5 mg by mouth 2 (two) times daily.  . Cholecalciferol 125 MCG (5000 UT) TABS Take 1 tablet by mouth daily.  . colesevelam (WELCHOL) 625 MG tablet Take 1,875 mg by mouth 2 (two) times daily with a meal.   . fluticasone (FLONASE) 50 MCG/ACT nasal spray Place 2 sprays into both nostrils daily.   . hydrALAZINE (APRESOLINE) 25 MG tablet Take 1 tablet by mouth 2 (two) times daily.  [name]  lisinopril (PRINIVIL,ZESTRIL) 20 MG tablet Take 20 mg by mouth daily.  . metoprolol tartrate (LOPRESSOR) 25 MG tablet Take 1 tablet by mouth 2 (two) times daily.  [name] omeprazole (PRILOSEC) 20 MG capsule Take 20 mg by mouth daily.     Allergies:   Desipramine hcl, Plavix [clopidogrel bisulfate], Statins, Clindamycin, Clams [shellfish allergy], Lipitor [atorvastatin], Penicillins, Sulfa antibiotics, and Verapamil   Social History   Tobacco Use  . Smoking status: Never Smoker  . Smokeless tobacco: Never Used  Substance Use Topics  . Alcohol use: No  . Drug use: No     Family Hx: The patient's family history includes Stroke in her father. There is no history of Heart attack, Heart disease, or Diabetes.  ROS:   Please see the history of present illness.     All other systems reviewed and are negative.   Prior CV studies:   The following studies were reviewed today:    Labs/Other Tests and Data Reviewed:    EKG:  No ECG reviewed.  Recent Labs: No results found for requested labs within last 8760 hours.   Recent Lipid Panel No results found for: CHOL, TRIG, HDL, CHOLHDL, LDLCALC, LDLDIRECT  Wt Readings from Last 3 Encounters:  08/29/18 220 lb (99.8 kg)  12/08/17 220 lb 6.4 oz (100 kg)  11/23/13 197 lb 12.8 oz (89.7 kg)     Objective:    Vital Signs:  Ht 5' 5.5" (1.664 m)   Wt 220 lb (99.8 kg)   BMI 36.05 kg/m    Normal affect. Normal speech pattern and tone. Comfortable, no apparent distress, No audibel signs of SOb or wheezing.   ASSESSMENT & PLAN:    1. PAF - no symptoms, continue current meds  2. Symptomatic bradycardia - pacemaker with normal function last month by device check, no symptoms. Continue to monitor  3. HTN - compliant with meds  COVID-19 Education: The signs and symptoms of COVID-19 were discussed with the patient and how to seek care for testing (follow up with PCP or arrange E-visit).  The importance of social distancing was discussed  today.  Time:   Today, I have spent 16 minutes with the patient with telehealth technology discussing the above problems.     Medication Adjustments/Labs and Tests Ordered: Current medicines are reviewed at length with the patient today.  Concerns regarding medicines are outlined above.   Tests Ordered: No orders of the defined types were placed in this encounter.   Medication Changes: No orders of the defined types were placed in this encounter.   Follow Up:  In Person in 6 month(s)  Signed, Carlyle Dolly, MD  08/29/2018 4:10 PM    [facility]

## 2018-08-29 NOTE — Telephone Encounter (Signed)
Pt requested to speak with device nurse regarding the life of her PPM - will forward to device triage

## 2018-08-29 NOTE — Patient Instructions (Signed)
Your physician wants you to follow-up in: [place] will receive a reminder letter in the mail two months in advance. If you don't receive a letter, please call our office to schedule the follow-up appointment.  Your physician recommends that you continue on your current medications as directed. Please refer to the Current Medication list given to you today.  WE WILL HAVE DEVICE CLINIC CALL YOU  Thank you for choosing [facility]!!

## 2018-08-29 NOTE — Telephone Encounter (Signed)
Spoke to pt, let her know that her device has about 6 years left. She was worried about not being able to send her scheduled transmissions. Let her know that we have received her last transmission and that if we don't receive a transmission that we will give her a call.

## 2018-10-20 ENCOUNTER — Ambulatory Visit (INDEPENDENT_AMBULATORY_CARE_PROVIDER_SITE_OTHER): Payer: Medicare Other | Admitting: *Deleted

## 2018-10-20 DIAGNOSIS — I495 Sick sinus syndrome: Secondary | ICD-10-CM

## 2018-10-20 LAB — CUP PACEART REMOTE DEVICE CHECK
Battery Remaining Longevity: 73 mo
Battery Voltage: 3 V
Brady Statistic AP VP Percent: 0.01 %
Brady Statistic AP VS Percent: 4.57 %
Brady Statistic AS VP Percent: 0.03 %
Brady Statistic AS VS Percent: 95.39 %
Brady Statistic RA Percent Paced: 4.58 %
Brady Statistic RV Percent Paced: 0.04 %
Date Time Interrogation Session: 20201001123839
Implantable Lead Implant Date: 20150616
Implantable Lead Implant Date: 20150616
Implantable Lead Location: 753859
Implantable Lead Location: 753860
Implantable Lead Model: 5076
Implantable Lead Model: 5076
Implantable Lead Serial Number: 3746147
Implantable Pulse Generator Implant Date: 20150616
Lead Channel Impedance Value: 342 Ohm
Lead Channel Impedance Value: 361 Ohm
Lead Channel Impedance Value: 456 Ohm
Lead Channel Impedance Value: 532 Ohm
Lead Channel Pacing Threshold Amplitude: 0.625 V
Lead Channel Pacing Threshold Amplitude: 1 V
Lead Channel Pacing Threshold Pulse Width: 0.4 ms
Lead Channel Pacing Threshold Pulse Width: 0.4 ms
Lead Channel Sensing Intrinsic Amplitude: 14.375 mV
Lead Channel Sensing Intrinsic Amplitude: 14.375 mV
Lead Channel Sensing Intrinsic Amplitude: 2 mV
Lead Channel Sensing Intrinsic Amplitude: 2 mV
Lead Channel Setting Pacing Amplitude: 1.5 V
Lead Channel Setting Pacing Amplitude: 2.25 V
Lead Channel Setting Pacing Pulse Width: 0.4 ms
Lead Channel Setting Sensing Sensitivity: 2.8 mV

## 2018-10-27 ENCOUNTER — Encounter: Payer: Self-pay | Admitting: Cardiology

## 2018-10-27 NOTE — Progress Notes (Signed)
Remote pacemaker transmission.   

## 2019-01-19 ENCOUNTER — Ambulatory Visit (INDEPENDENT_AMBULATORY_CARE_PROVIDER_SITE_OTHER): Payer: Medicare Other | Admitting: *Deleted

## 2019-01-19 DIAGNOSIS — Z950 Presence of cardiac pacemaker: Secondary | ICD-10-CM

## 2019-01-19 LAB — CUP PACEART REMOTE DEVICE CHECK
Battery Remaining Longevity: 67 mo
Battery Voltage: 3 V
Brady Statistic AP VP Percent: 0.01 %
Brady Statistic AP VS Percent: 3.4 %
Brady Statistic AS VP Percent: 0.03 %
Brady Statistic AS VS Percent: 96.56 %
Brady Statistic RA Percent Paced: 3.4 %
Brady Statistic RV Percent Paced: 0.04 %
Date Time Interrogation Session: 20201231102728
Implantable Lead Implant Date: 20150616
Implantable Lead Implant Date: 20150616
Implantable Lead Location: 753859
Implantable Lead Location: 753860
Implantable Lead Model: 5076
Implantable Lead Model: 5076
Implantable Lead Serial Number: 3746147
Implantable Pulse Generator Implant Date: 20150616
Lead Channel Impedance Value: 342 Ohm
Lead Channel Impedance Value: 361 Ohm
Lead Channel Impedance Value: 475 Ohm
Lead Channel Impedance Value: 551 Ohm
Lead Channel Pacing Threshold Amplitude: 0.625 V
Lead Channel Pacing Threshold Amplitude: 1 V
Lead Channel Pacing Threshold Pulse Width: 0.4 ms
Lead Channel Pacing Threshold Pulse Width: 0.4 ms
Lead Channel Sensing Intrinsic Amplitude: 15.875 mV
Lead Channel Sensing Intrinsic Amplitude: 15.875 mV
Lead Channel Sensing Intrinsic Amplitude: 2.375 mV
Lead Channel Sensing Intrinsic Amplitude: 2.375 mV
Lead Channel Setting Pacing Amplitude: 1.5 V
Lead Channel Setting Pacing Amplitude: 2 V
Lead Channel Setting Pacing Pulse Width: 0.4 ms
Lead Channel Setting Sensing Sensitivity: 2.8 mV

## 2019-01-20 NOTE — Progress Notes (Signed)
PPM remote 

## 2019-03-27 ENCOUNTER — Ambulatory Visit: Payer: Medicare Other | Admitting: Cardiology

## 2019-04-05 ENCOUNTER — Encounter: Payer: Self-pay | Admitting: *Deleted

## 2019-04-05 ENCOUNTER — Telehealth (INDEPENDENT_AMBULATORY_CARE_PROVIDER_SITE_OTHER): Payer: Medicare Other | Admitting: Cardiology

## 2019-04-05 ENCOUNTER — Encounter: Payer: Self-pay | Admitting: Cardiology

## 2019-04-05 VITALS — BP 118/78 | Ht 65.5 in | Wt 218.0 lb

## 2019-04-05 DIAGNOSIS — I480 Paroxysmal atrial fibrillation: Secondary | ICD-10-CM | POA: Diagnosis not present

## 2019-04-05 DIAGNOSIS — R001 Bradycardia, unspecified: Secondary | ICD-10-CM

## 2019-04-05 DIAGNOSIS — I10 Essential (primary) hypertension: Secondary | ICD-10-CM

## 2019-04-05 NOTE — Progress Notes (Signed)
Virtual Visit via Telephone Note   This visit type was conducted due to national recommendations for restrictions regarding the COVID-19 Pandemic (e.g. social distancing) in an effort to limit this patient's exposure and mitigate transmission in our community.  Due to her co-morbid illnesses, this patient is at least at moderate risk for complications without adequate follow up.  This format is felt to be most appropriate for this patient at this time.  The patient did not have access to video technology/had technical difficulties with video requiring transitioning to audio format only (telephone).  All issues noted in this document were discussed and addressed.  No physical exam could be performed with this format.  Please refer to the patient's chart for her  consent to telehealth for [facility].   The patient was identified using 2 identifiers.  Date:  04/05/2019   ID:  Ashley Nguyen, DOB [birth year], MRN [id number]  Patient Location: Home Provider Location: Office  PCP:  Aletha Halim., PA-C  Cardiologist:  Carlyle Dolly, MD  Electrophysiologist:  None   Evaluation Performed:  Follow-Up Visit  Chief Complaint:  Follow up visit  History of Present Illness:    Ashley Anne Osmond is a 86 y.o. female seen today for follow up of the following medical problems.   1. PAF - no recent palpitations - compliant with meds. No bleeding on eliquis.  2. Pacemaker - normal 12/2018 normal device function - no symptoms  3. HTN - compliant with meds - we have deferred ACEI use to her neprhologist.  4. CKD IV - followed [facility].     Has gotten [facility] covid shots at [facility].   The patient does not have symptoms concerning for COVID-19 infection (fever, chills, cough, or new shortness of breath).    Past Medical History:  Diagnosis Date  . Arthritis    "hands, feet, knees, left hip" (07/04/2013)  . Borderline diabetes   . Cataracts,  bilateral   . GERD (gastroesophageal reflux disease)   . Headache    "@ least monthly" (07/04/2013)  . History of blood transfusion    "after taking Plavix; developed bleeding ulcer; had to have transfusions"  . History of colon polyps   . History of gastric ulcer   . History of GI bleed   . Hyperlipidemia   . Hypertension   . Impaired hearing   . Iron deficiency anemia    at times has been on iron supplements  . Kidney stones    "passed them"  . Migraine    "none in 2015; used to come quite frequently" (07/04/2013)  . Osteoarthritis    a. 04/2013 s/p L TKA.  [name] PAF (paroxysmal atrial fibrillation) ([place])    a. 04/2013 periop L TKA.  . Seasonal allergies   . Skin cancer    "left cheek"   . Symptomatic bradycardia   . TIA (transient ischemic attack) ~ 2002  . Vertigo    Past Surgical History:  Procedure Laterality Date  . APPENDECTOMY     as a child  . COLONOSCOPY    . ESOPHAGOGASTRODUODENOSCOPY    . KNEE ARTHROSCOPY Left 11/2012  . PACEMAKER INSERTION  07/04/2013   MDT dual chamber Advisa pacemaker implanted by Dr Caryl Comes for 2:1 AV block  . PERMANENT PACEMAKER INSERTION N/A 07/04/2013   Procedure: PERMANENT PACEMAKER INSERTION;  Surgeon: Deboraha Sprang, MD;  Location: [facility] CATH LAB;  Service: Cardiovascular;  Laterality: N/A;  . SKIN CANCER EXCISION Left ~ 2001   "  cheek"  . thumb surgery Right 1980's   "made it usable"  . TONSILLECTOMY AND ADENOIDECTOMY  ~ 1942  . TOTAL KNEE ARTHROPLASTY Left 04/25/2013   Procedure: TOTAL KNEE ARTHROPLASTY;  Surgeon: Renette Butters, MD;  Location: [place];  Service: Orthopedics;  Laterality: Left;  . TUBAL LIGATION  ~ 1970     No outpatient medications have been marked as taking for the 04/05/19 encounter (Appointment) with Arnoldo Lenis, MD.     Allergies:   Desipramine hcl, Plavix [clopidogrel bisulfate], Statins, Clindamycin, Clams [shellfish allergy], Lipitor [atorvastatin], Penicillins, Sulfa antibiotics, and Verapamil   Social  History   Tobacco Use  . Smoking status: Never Smoker  . Smokeless tobacco: Never Used  Substance Use Topics  . Alcohol use: No  . Drug use: No     Family Hx: The patient's family history includes Stroke in her father. There is no history of Heart attack, Heart disease, or Diabetes.  ROS:   Please see the history of present illness.     All other systems reviewed and are negative.   Prior CV studies:   The following studies were reviewed today:   Labs/Other Tests and Data Reviewed:    EKG:  No ECG reviewed.  Recent Labs: No results found for requested labs within last 8760 hours.   Recent Lipid Panel No results found for: CHOL, TRIG, HDL, CHOLHDL, LDLCALC, LDLDIRECT  Wt Readings from Last 3 Encounters:  08/29/18 220 lb (99.8 kg)  12/08/17 220 lb 6.4 oz (100 kg)  11/23/13 197 lb 12.8 oz (89.7 kg)     Objective:    Vital Signs:   Today's Vitals   04/05/19 1057  BP: 118/78  Weight: 218 lb (98.9 kg)  Height: 5' 5.5" (1.664 m)   Body mass index is 35.73 kg/m. Normal affect. Normal speech pattern and tone. Comfortable, no apparent distress. No audible signs of sob or wheezing.   ASSESSMENT & PLAN:     1. PAF - no recent symptoms, continue current meds. She is on appropriate dosing of eliquis based on her age and Cr.   2. Symptomatic bradycardia - normal pacemaker function 12/2018, has upcoming repeat check - no symptoms, continue to monitor  3. HTN - bp at goal, continue current meds  COVID-19 Education: The signs and symptoms of COVID-19 were discussed with the patient and how to seek care for testing (follow up with PCP or arrange E-visit).  The importance of social distancing was discussed today.  Time:   Today, I have spent 21 minutes with the patient with telehealth technology discussing the above problems.     Medication Adjustments/Labs and Tests Ordered: Current medicines are reviewed at length with the patient today.  Concerns regarding  medicines are outlined above.   Tests Ordered: No orders of the defined types were placed in this encounter.   Medication Changes: No orders of the defined types were placed in this encounter.   Follow Up:  Either In Person or Virtual in 6 month(s)  Signed, Carlyle Dolly, MD  04/05/2019 9:52 AM    [place]

## 2019-04-05 NOTE — Patient Instructions (Signed)

## 2019-04-20 ENCOUNTER — Ambulatory Visit (INDEPENDENT_AMBULATORY_CARE_PROVIDER_SITE_OTHER): Payer: Medicare Other | Admitting: *Deleted

## 2019-04-20 DIAGNOSIS — Z950 Presence of cardiac pacemaker: Secondary | ICD-10-CM | POA: Diagnosis not present

## 2019-04-20 LAB — CUP PACEART REMOTE DEVICE CHECK
Battery Remaining Longevity: 69 mo
Battery Voltage: 3 V
Brady Statistic AP VP Percent: 0 %
Brady Statistic AP VS Percent: 1.55 %
Brady Statistic AS VP Percent: 0.03 %
Brady Statistic AS VS Percent: 98.41 %
Brady Statistic RA Percent Paced: 1.55 %
Brady Statistic RV Percent Paced: 0.04 %
Date Time Interrogation Session: 20210401101027
Implantable Lead Implant Date: 20150616
Implantable Lead Implant Date: 20150616
Implantable Lead Location: 753859
Implantable Lead Location: 753860
Implantable Lead Model: 5076
Implantable Lead Model: 5076
Implantable Lead Serial Number: 3746147
Implantable Pulse Generator Implant Date: 20150616
Lead Channel Impedance Value: 361 Ohm
Lead Channel Impedance Value: 399 Ohm
Lead Channel Impedance Value: 494 Ohm
Lead Channel Impedance Value: 513 Ohm
Lead Channel Pacing Threshold Amplitude: 0.75 V
Lead Channel Pacing Threshold Amplitude: 1.125 V
Lead Channel Pacing Threshold Pulse Width: 0.4 ms
Lead Channel Pacing Threshold Pulse Width: 0.4 ms
Lead Channel Sensing Intrinsic Amplitude: 15.125 mV
Lead Channel Sensing Intrinsic Amplitude: 15.125 mV
Lead Channel Sensing Intrinsic Amplitude: 2.625 mV
Lead Channel Sensing Intrinsic Amplitude: 2.625 mV
Lead Channel Setting Pacing Amplitude: 1.5 V
Lead Channel Setting Pacing Amplitude: 2.25 V
Lead Channel Setting Pacing Pulse Width: 0.4 ms
Lead Channel Setting Sensing Sensitivity: 2.8 mV

## 2019-04-21 NOTE — Progress Notes (Signed)
PPM Remote  

## 2019-07-21 ENCOUNTER — Telehealth: Payer: Self-pay

## 2019-07-21 NOTE — Telephone Encounter (Signed)
Patient called in her home monitor is giving her the 3230 code and she has charged it and it is 84 years old. She is going to call tech support and give me a call back to let me know if they are going to send a new monitor

## 2019-07-21 NOTE — Telephone Encounter (Signed)
Patient called back and [employer] is going to send a new monitor and she should have it within a week and she will send that transmission as soon as she gets it and will call us if she has any trouble

## 2019-07-27 ENCOUNTER — Ambulatory Visit (INDEPENDENT_AMBULATORY_CARE_PROVIDER_SITE_OTHER): Payer: Medicare Other | Admitting: *Deleted

## 2019-07-27 DIAGNOSIS — I495 Sick sinus syndrome: Secondary | ICD-10-CM | POA: Diagnosis not present

## 2019-07-28 LAB — CUP PACEART REMOTE DEVICE CHECK
Battery Remaining Longevity: 59 mo
Battery Voltage: 2.99 V
Brady Statistic AP VP Percent: 0.01 %
Brady Statistic AP VS Percent: 4.05 %
Brady Statistic AS VP Percent: 0.03 %
Brady Statistic AS VS Percent: 95.92 %
Brady Statistic RA Percent Paced: 4.05 %
Brady Statistic RV Percent Paced: 0.04 %
Date Time Interrogation Session: 20210708150543
Implantable Lead Implant Date: 20150616
Implantable Lead Implant Date: 20150616
Implantable Lead Location: 753859
Implantable Lead Location: 753860
Implantable Lead Model: 5076
Implantable Lead Model: 5076
Implantable Lead Serial Number: 3746147
Implantable Pulse Generator Implant Date: 20150616
Lead Channel Impedance Value: 342 Ohm
Lead Channel Impedance Value: 380 Ohm
Lead Channel Impedance Value: 475 Ohm
Lead Channel Impedance Value: 513 Ohm
Lead Channel Pacing Threshold Amplitude: 0.75 V
Lead Channel Pacing Threshold Amplitude: 1.25 V
Lead Channel Pacing Threshold Pulse Width: 0.4 ms
Lead Channel Pacing Threshold Pulse Width: 0.4 ms
Lead Channel Sensing Intrinsic Amplitude: 13.75 mV
Lead Channel Sensing Intrinsic Amplitude: 13.75 mV
Lead Channel Sensing Intrinsic Amplitude: 2.25 mV
Lead Channel Sensing Intrinsic Amplitude: 2.25 mV
Lead Channel Setting Pacing Amplitude: 1.5 V
Lead Channel Setting Pacing Amplitude: 2.5 V
Lead Channel Setting Pacing Pulse Width: 0.4 ms
Lead Channel Setting Sensing Sensitivity: 2.8 mV

## 2019-07-31 NOTE — Progress Notes (Signed)
Remote pacemaker transmission.   

## 2019-09-26 ENCOUNTER — Encounter: Payer: Self-pay | Admitting: Cardiology

## 2019-09-26 ENCOUNTER — Encounter: Payer: Self-pay | Admitting: *Deleted

## 2019-09-26 ENCOUNTER — Ambulatory Visit (INDEPENDENT_AMBULATORY_CARE_PROVIDER_SITE_OTHER): Payer: Medicare Other | Admitting: Cardiology

## 2019-09-26 VITALS — BP 126/86 | HR 68 | Ht 65.5 in | Wt 221.6 lb

## 2019-09-26 DIAGNOSIS — I480 Paroxysmal atrial fibrillation: Secondary | ICD-10-CM

## 2019-09-26 DIAGNOSIS — R001 Bradycardia, unspecified: Secondary | ICD-10-CM | POA: Diagnosis not present

## 2019-09-26 DIAGNOSIS — I10 Essential (primary) hypertension: Secondary | ICD-10-CM

## 2019-09-26 NOTE — Progress Notes (Signed)
Clinical Summary Ashley Nguyen is a 86 y.o.female seen today for follow up of the following medical problems.   1. PAF - no recent palpitations - compliant with meds. No bleeding on eliquis.  - denies any palpitaitons, overall she has been asymptomatic with her afib - compliant with meds - no bleeding on eliquis   2. Pacemaker - normal 12/2018 normal device function - 07/2019 normal device check - no recent symptosm.   3. HTN - we have deferred ACEI use to her neprhologist.  - compliant with meds  4. CKD IV - followed [facility].     Has gotten both covid shots at [facility].  Daughter is English as a second language teacher that she lives with, retired from [employer] Married to husband 36 yo   Past Medical History:  Diagnosis Date  . Arthritis    "hands, feet, knees, left hip" (07/04/2013)  . Borderline diabetes   . Cataracts, bilateral   . GERD (gastroesophageal reflux disease)   . Headache    "@ least monthly" (07/04/2013)  . History of blood transfusion    "after taking Plavix; developed bleeding ulcer; had to have transfusions"  . History of colon polyps   . History of gastric ulcer   . History of GI bleed   . Hyperlipidemia   . Hypertension   . Impaired hearing   . Iron deficiency anemia    at times has been on iron supplements  . Kidney stones    "passed them"  . Migraine    "none in 2015; used to come quite frequently" (07/04/2013)  . Osteoarthritis    a. 04/2013 s/p L TKA.  [name] PAF (paroxysmal atrial fibrillation) ([place])    a. 04/2013 periop L TKA.  . Seasonal allergies   . Skin cancer    "left cheek"   . Symptomatic bradycardia   . TIA (transient ischemic attack) ~ 2002  . Vertigo      Allergies  Allergen Reactions  . Desipramine Hcl Other (See Comments)    "painful urination, then kidneys shut down for hours."  . Plavix [Clopidogrel Bisulfate] Other (See Comments)    "Almost bled to death."  . Statins Other (See Comments)    Extreme pain in  legs with some swelling  . Clindamycin Other (See Comments)    Patient complains of severe stomach burning.  [name] Clams [Shellfish Allergy] Nausea And Vomiting    Per pt, NOT a general shellfish allergy.  Clams only.  . Lipitor [Atorvastatin] Other (See Comments)    Muscle aches.  . Penicillins Hives  . Sulfa Antibiotics Hives  . Verapamil Swelling     Current Outpatient Medications  Medication Sig Dispense Refill  . apixaban (ELIQUIS) 2.5 MG TABS tablet Take 2.5 mg by mouth 2 (two) times daily.    . Cholecalciferol 125 MCG (5000 UT) TABS Take 1 tablet by mouth daily.    . colesevelam (WELCHOL) 625 MG tablet Take 1,875 mg by mouth 2 (two) times daily with a meal.     . fluticasone (FLONASE) 50 MCG/ACT nasal spray Place 2 sprays into both nostrils daily.     . hydrALAZINE (APRESOLINE) 25 MG tablet Take 1 tablet by mouth 2 (two) times daily.    [name] lisinopril (PRINIVIL,ZESTRIL) 20 MG tablet Take 20 mg by mouth daily.    . metoprolol tartrate (LOPRESSOR) 25 MG tablet Take 1 tablet by mouth 2 (two) times daily.    [name] omeprazole (PRILOSEC) 20 MG capsule Take 20 mg by mouth  daily.     No current facility-administered medications for this visit.     Past Surgical History:  Procedure Laterality Date  . APPENDECTOMY     as a child  . COLONOSCOPY    . ESOPHAGOGASTRODUODENOSCOPY    . KNEE ARTHROSCOPY Left 11/2012  . PACEMAKER INSERTION  07/04/2013   MDT dual chamber Advisa pacemaker implanted by Dr Caryl Comes for 2:1 AV block  . PERMANENT PACEMAKER INSERTION N/A 07/04/2013   Procedure: PERMANENT PACEMAKER INSERTION;  Surgeon: Deboraha Sprang, MD;  Location: [facility] CATH LAB;  Service: Cardiovascular;  Laterality: N/A;  . SKIN CANCER EXCISION Left ~ 2001   "cheek"  . thumb surgery Right 1980's   "made it usable"  . TONSILLECTOMY AND ADENOIDECTOMY  ~ 1942  . TOTAL KNEE ARTHROPLASTY Left 04/25/2013   Procedure: TOTAL KNEE ARTHROPLASTY;  Surgeon: Renette Butters, MD;  Location: [place];  Service:  Orthopedics;  Laterality: Left;  . TUBAL LIGATION  ~ 1970     Allergies  Allergen Reactions  . Desipramine Hcl Other (See Comments)    "painful urination, then kidneys shut down for hours."  . Plavix [Clopidogrel Bisulfate] Other (See Comments)    "Almost bled to death."  . Statins Other (See Comments)    Extreme pain in legs with some swelling  . Clindamycin Other (See Comments)    Patient complains of severe stomach burning.  [name] Clams [Shellfish Allergy] Nausea And Vomiting    Per pt, NOT a general shellfish allergy.  Clams only.  . Lipitor [Atorvastatin] Other (See Comments)    Muscle aches.  . Penicillins Hives  . Sulfa Antibiotics Hives  . Verapamil Swelling      Family History  Problem Relation Age of Onset  . Stroke Father   . Heart attack Neg Hx   . Heart disease Neg Hx   . Diabetes Neg Hx      Social History Ashley Nguyen reports that she has never smoked. She has never used smokeless tobacco. Ashley Nguyen reports no history of alcohol use.   Review of Systems CONSTITUTIONAL: No weight loss, fever, chills, weakness or fatigue.  HEENT: Eyes: No visual loss, blurred vision, double vision or yellow sclerae.No hearing loss, sneezing, congestion, runny nose or sore throat.  SKIN: No rash or itching.  CARDIOVASCULAR: per hpi RESPIRATORY: No shortness of breath, cough or sputum.  GASTROINTESTINAL: No anorexia, nausea, vomiting or diarrhea. No abdominal pain or blood.  GENITOURINARY: No burning on urination, no polyuria NEUROLOGICAL: No headache, dizziness, syncope, paralysis, ataxia, numbness or tingling in the extremities. No change in bowel or bladder control.  MUSCULOSKELETAL: No muscle, back pain, joint pain or stiffness.  LYMPHATICS: No enlarged nodes. No history of splenectomy.  PSYCHIATRIC: No history of depression or anxiety.  ENDOCRINOLOGIC: No reports of sweating, cold or heat intolerance. No polyuria or polydipsia.  [name]   Physical Examination Today's  Vitals   09/26/19 1549  BP: 126/86  Pulse: 68  SpO2: 99%  Weight: 221 lb 9.6 oz (100.5 kg)  Height: 5' 5.5" (1.664 m)   Body mass index is 36.32 kg/m.  Gen: resting comfortably, no acute distress HEENT: no scleral icterus, pupils equal round and reactive, no palptable cervical adenopathy,  CV: RRR, no m/r/g no jvd Resp: Clear to auscultation bilaterally GI: abdomen is soft, non-tender, non-distended, normal bowel sounds, no hepatosplenomegaly MSK: extremities are warm, no edema.  Skin: warm, no rash Neuro:  no focal deficits Psych: appropriate affect      Assessment and  Plan    1. PAF - no symptoms, continue current meds  2. Symptomatic bradycardia - normal pacemaker function by last check, no symptoms - continue to monitor  3. HTN - at goal, continue currnet meds     Arnoldo Lenis, M.D.

## 2019-09-26 NOTE — Patient Instructions (Signed)

## 2019-10-26 ENCOUNTER — Ambulatory Visit (INDEPENDENT_AMBULATORY_CARE_PROVIDER_SITE_OTHER): Payer: Medicare Other

## 2019-10-26 DIAGNOSIS — I495 Sick sinus syndrome: Secondary | ICD-10-CM | POA: Diagnosis not present

## 2019-10-26 LAB — CUP PACEART REMOTE DEVICE CHECK
Battery Remaining Longevity: 61 mo
Battery Voltage: 2.99 V
Brady Statistic AP VP Percent: 0.01 %
Brady Statistic AP VS Percent: 5.84 %
Brady Statistic AS VP Percent: 0.03 %
Brady Statistic AS VS Percent: 94.12 %
Brady Statistic RA Percent Paced: 5.85 %
Brady Statistic RV Percent Paced: 0.04 %
Date Time Interrogation Session: 20211007084353
Implantable Lead Implant Date: 20150616
Implantable Lead Implant Date: 20150616
Implantable Lead Location: 753859
Implantable Lead Location: 753860
Implantable Lead Model: 5076
Implantable Lead Model: 5076
Implantable Lead Serial Number: 3746147
Implantable Pulse Generator Implant Date: 20150616
Lead Channel Impedance Value: 361 Ohm
Lead Channel Impedance Value: 361 Ohm
Lead Channel Impedance Value: 475 Ohm
Lead Channel Impedance Value: 532 Ohm
Lead Channel Pacing Threshold Amplitude: 0.75 V
Lead Channel Pacing Threshold Amplitude: 1 V
Lead Channel Pacing Threshold Pulse Width: 0.4 ms
Lead Channel Pacing Threshold Pulse Width: 0.4 ms
Lead Channel Sensing Intrinsic Amplitude: 14.625 mV
Lead Channel Sensing Intrinsic Amplitude: 14.625 mV
Lead Channel Sensing Intrinsic Amplitude: 2.125 mV
Lead Channel Sensing Intrinsic Amplitude: 2.125 mV
Lead Channel Setting Pacing Amplitude: 1.5 V
Lead Channel Setting Pacing Amplitude: 2.25 V
Lead Channel Setting Pacing Pulse Width: 0.4 ms
Lead Channel Setting Sensing Sensitivity: 2.8 mV

## 2019-10-30 NOTE — Progress Notes (Signed)
Remote pacemaker transmission.   

## 2020-01-25 ENCOUNTER — Ambulatory Visit (INDEPENDENT_AMBULATORY_CARE_PROVIDER_SITE_OTHER): Payer: Medicare Other

## 2020-01-25 DIAGNOSIS — I495 Sick sinus syndrome: Secondary | ICD-10-CM | POA: Diagnosis not present

## 2020-01-25 LAB — CUP PACEART REMOTE DEVICE CHECK
Battery Remaining Longevity: 54 mo
Battery Voltage: 2.99 V
Brady Statistic AP VP Percent: 0.01 %
Brady Statistic AP VS Percent: 5.39 %
Brady Statistic AS VP Percent: 0.03 %
Brady Statistic AS VS Percent: 94.57 %
Brady Statistic RA Percent Paced: 5.39 %
Brady Statistic RV Percent Paced: 0.04 %
Date Time Interrogation Session: 20220106094408
Implantable Lead Implant Date: 20150616
Implantable Lead Implant Date: 20150616
Implantable Lead Location: 753859
Implantable Lead Location: 753860
Implantable Lead Model: 5076
Implantable Lead Model: 5076
Implantable Lead Serial Number: 3746147
Implantable Pulse Generator Implant Date: 20150616
Lead Channel Impedance Value: 342 Ohm
Lead Channel Impedance Value: 380 Ohm
Lead Channel Impedance Value: 475 Ohm
Lead Channel Impedance Value: 532 Ohm
Lead Channel Pacing Threshold Amplitude: 0.625 V
Lead Channel Pacing Threshold Amplitude: 1 V
Lead Channel Pacing Threshold Pulse Width: 0.4 ms
Lead Channel Pacing Threshold Pulse Width: 0.4 ms
Lead Channel Sensing Intrinsic Amplitude: 15.625 mV
Lead Channel Sensing Intrinsic Amplitude: 15.625 mV
Lead Channel Sensing Intrinsic Amplitude: 2 mV
Lead Channel Sensing Intrinsic Amplitude: 2 mV
Lead Channel Setting Pacing Amplitude: 1.5 V
Lead Channel Setting Pacing Amplitude: 2 V
Lead Channel Setting Pacing Pulse Width: 0.4 ms
Lead Channel Setting Sensing Sensitivity: 2.8 mV

## 2020-02-08 NOTE — Progress Notes (Signed)
Remote pacemaker transmission.   

## 2020-03-28 ENCOUNTER — Ambulatory Visit (INDEPENDENT_AMBULATORY_CARE_PROVIDER_SITE_OTHER): Payer: Medicare Other | Admitting: Cardiology

## 2020-03-28 ENCOUNTER — Encounter: Payer: Self-pay | Admitting: Cardiology

## 2020-03-28 VITALS — BP 110/78 | HR 67 | Ht 65.0 in | Wt 218.0 lb

## 2020-03-28 DIAGNOSIS — I480 Paroxysmal atrial fibrillation: Secondary | ICD-10-CM

## 2020-03-28 DIAGNOSIS — R001 Bradycardia, unspecified: Secondary | ICD-10-CM | POA: Diagnosis not present

## 2020-03-28 DIAGNOSIS — I10 Essential (primary) hypertension: Secondary | ICD-10-CM

## 2020-03-28 NOTE — Patient Instructions (Signed)
Your physician recommends that you schedule a follow-up appointment in: 6 MONTHS WITH DR BRANCH  Your physician recommends that you continue on your current medications as directed. Please refer to the Current Medication list given to you today.  Thank you for choosing [facility]!!    

## 2020-03-28 NOTE — Progress Notes (Signed)
Clinical Summary [name] is a 87 y.o.female seen today for follow up of the following medical problems.  1. PAF  - no recent palpitaitons.  - compliatnw with meds - no bleeding on eliwuis  2. Pacemaker -normal 12/2018 normal device function - 07/2019 normal device check - no recent symptosm.   Normal device check Jan 2022  3. HTN - we have deferred ACEI use to her neprhologist.  - compliant with meds  4. CKD IV - followed [facility].     Has gotten both covid shots at [facility]. 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 Arthritis    "hands, feet, knees, left hip" (07/04/2013)  . Borderline diabetes   . Cataracts, bilateral   . GERD (gastroesophageal reflux disease)   . Headache    "@ least monthly" (07/04/2013)  . History of blood transfusion    "after taking Plavix; developed bleeding ulcer; had to have transfusions"  . History of colon polyps   . History of gastric ulcer   . History of GI bleed   . Hyperlipidemia   . Hypertension   . Impaired hearing   . Iron deficiency anemia    at times has been on iron supplements  . Kidney stones    "passed them"  . Migraine    "none in 2015; used to come quite frequently" (07/04/2013)  . Osteoarthritis    a. 04/2013 s/p L TKA.  [name] PAF (paroxysmal atrial fibrillation) ([place])    a. 04/2013 periop L TKA.  . Seasonal allergies   . Skin cancer    "left cheek"   . Symptomatic bradycardia   . TIA (transient ischemic attack) ~ 2002  . Vertigo      Allergies  Allergen Reactions  . Desipramine Hcl Other (See Comments)    "painful urination, then kidneys shut down for hours."  . Plavix [Clopidogrel Bisulfate] Other (See Comments)    "Almost bled to death."  . Statins Other (See Comments)    Extreme pain in legs with some swelling  . Clindamycin Other (See Comments)    Patient  complains of severe stomach burning.  [name] Clams [Shellfish Allergy] Nausea And Vomiting    Per pt, NOT a general shellfish allergy.  Clams only.  . Lipitor [Atorvastatin] Other (See Comments)    Muscle aches.  . Penicillins Hives  . Sulfa Antibiotics Hives  . Verapamil Swelling     Current Outpatient Medications  Medication Sig Dispense Refill  . apixaban (ELIQUIS) 2.5 MG TABS tablet Take 2.5 mg by mouth 2 (two) times daily.    . Cholecalciferol 125 MCG (5000 UT) TABS Take 1 tablet by mouth daily.    . colesevelam (WELCHOL) 625 MG tablet Take 1,875 mg by mouth 2 (two) times daily with a meal.     . fluticasone (FLONASE) 50 MCG/ACT nasal spray Place 2 sprays into both nostrils daily.     . hydrALAZINE (APRESOLINE) 25 MG tablet Take 1 tablet by mouth 2 (two) times daily.    [name] lisinopril (PRINIVIL,ZESTRIL) 20 MG tablet Take 20 mg by mouth daily.    . metoprolol tartrate (LOPRESSOR) 25 MG tablet Take 1 tablet by mouth 2 (two) times daily.    [name] omeprazole (PRILOSEC) 20 MG capsule Take 20 mg by mouth daily.     No current facility-administered medications for this visit.  Past Surgical History:  Procedure Laterality Date  . APPENDECTOMY     as a child  . COLONOSCOPY    . ESOPHAGOGASTRODUODENOSCOPY    . KNEE ARTHROSCOPY Left 11/2012  . PACEMAKER INSERTION  07/04/2013   MDT dual chamber Advisa pacemaker implanted by Dr Caryl Comes for 2:1 AV block  . PERMANENT PACEMAKER INSERTION N/A 07/04/2013   Procedure: PERMANENT PACEMAKER INSERTION;  Surgeon: Deboraha Sprang, MD;  Location: [facility] CATH LAB;  Service: Cardiovascular;  Laterality: N/A;  . SKIN CANCER EXCISION Left ~ 2001   "cheek"  . thumb surgery Right 1980's   "made it usable"  . TONSILLECTOMY AND ADENOIDECTOMY  ~ 1942  . TOTAL KNEE ARTHROPLASTY Left 04/25/2013   Procedure: TOTAL KNEE ARTHROPLASTY;  Surgeon: Renette Butters, MD;  Location: [place];  Service: Orthopedics;  Laterality: Left;  . TUBAL LIGATION  ~ 1970     Allergies   Allergen Reactions  . Desipramine Hcl Other (See Comments)    "painful urination, then kidneys shut down for hours."  . Plavix [Clopidogrel Bisulfate] Other (See Comments)    "Almost bled to death."  . Statins Other (See Comments)    Extreme pain in legs with some swelling  . Clindamycin Other (See Comments)    Patient complains of severe stomach burning.  [name] Clams [Shellfish Allergy] Nausea And Vomiting    Per pt, NOT a general shellfish allergy.  Clams only.  . Lipitor [Atorvastatin] Other (See Comments)    Muscle aches.  . Penicillins Hives  . Sulfa Antibiotics Hives  . Verapamil Swelling      Family History  Problem Relation Age of Onset  . Stroke Father   . Heart attack Neg Hx   . Heart disease Neg Hx   . Diabetes Neg Hx      Social History [name] reports that she has never smoked. She has never used smokeless tobacco. [name] reports no history of alcohol use.   Review of Systems CONSTITUTIONAL: No weight loss, fever, chills, weakness or fatigue.  HEENT: Eyes: No visual loss, blurred vision, double vision or yellow sclerae.No hearing loss, sneezing, congestion, runny nose or sore throat.  SKIN: No rash or itching.  CARDIOVASCULAR: per hpi RESPIRATORY: No shortness of breath, cough or sputum.  GASTROINTESTINAL: No anorexia, nausea, vomiting or diarrhea. No abdominal pain or blood.  GENITOURINARY: No burning on urination, no polyuria NEUROLOGICAL: No headache, dizziness, syncope, paralysis, ataxia, numbness or tingling in the extremities. No change in bowel or bladder control.  MUSCULOSKELETAL: No muscle, back pain, joint pain or stiffness.  LYMPHATICS: No enlarged nodes. No history of splenectomy.  PSYCHIATRIC: No history of depression or anxiety.  ENDOCRINOLOGIC: No reports of sweating, cold or heat intolerance. No polyuria or polydipsia.  [name]   Physical Examination Today's Vitals   03/28/20 1122  BP: 110/78  Pulse: 67  SpO2: 98%  Weight: 218 lb  (98.9 kg)  Height: 5\' 5"  (1.651 m)   Body mass index is 36.28 kg/m.  Gen: resting comfortably, no acute distress HEENT: no scleral icterus, pupils equal round and reactive, no palptable cervical adenopathy,  CV: RRR no m/r/g, no jvd Resp: Clear to auscultation bilaterally GI: abdomen is soft, non-tender, non-distended, normal bowel sounds, no hepatosplenomegaly MSK: extremities are warm, no edema.  Skin: warm, no rash Neuro:  no focal deficits Psych: appropriate affect   Diagnostic Studies     Assessment and Plan  1. PAF - doing well without symptoms, continue current meds - she is  on appropriate lower dose eliquis given age and renal function  2. Symptomatic bradycardia - no symptoms, recent normal device check - continue to monitor pacemaker in device clinic  3. HTN - bp is at goal, continue current meds     Arnoldo Lenis, M.D

## 2020-04-25 ENCOUNTER — Ambulatory Visit (INDEPENDENT_AMBULATORY_CARE_PROVIDER_SITE_OTHER): Payer: Medicare Other

## 2020-04-25 DIAGNOSIS — I495 Sick sinus syndrome: Secondary | ICD-10-CM | POA: Diagnosis not present

## 2020-04-30 LAB — CUP PACEART REMOTE DEVICE CHECK
Battery Remaining Longevity: 57 mo
Battery Voltage: 2.98 V
Brady Statistic AP VP Percent: 0.01 %
Brady Statistic AP VS Percent: 3.22 %
Brady Statistic AS VP Percent: 0.03 %
Brady Statistic AS VS Percent: 96.75 %
Brady Statistic RA Percent Paced: 3.22 %
Brady Statistic RV Percent Paced: 0.04 %
Date Time Interrogation Session: 20220408112249
Implantable Lead Implant Date: 20150616
Implantable Lead Implant Date: 20150616
Implantable Lead Location: 753859
Implantable Lead Location: 753860
Implantable Lead Model: 5076
Implantable Lead Model: 5076
Implantable Lead Serial Number: 3746147
Implantable Pulse Generator Implant Date: 20150616
Lead Channel Impedance Value: 361 Ohm
Lead Channel Impedance Value: 361 Ohm
Lead Channel Impedance Value: 475 Ohm
Lead Channel Impedance Value: 532 Ohm
Lead Channel Pacing Threshold Amplitude: 0.625 V
Lead Channel Pacing Threshold Amplitude: 1 V
Lead Channel Pacing Threshold Pulse Width: 0.4 ms
Lead Channel Pacing Threshold Pulse Width: 0.4 ms
Lead Channel Sensing Intrinsic Amplitude: 15.625 mV
Lead Channel Sensing Intrinsic Amplitude: 15.625 mV
Lead Channel Sensing Intrinsic Amplitude: 2.25 mV
Lead Channel Sensing Intrinsic Amplitude: 2.25 mV
Lead Channel Setting Pacing Amplitude: 1.5 V
Lead Channel Setting Pacing Amplitude: 2 V
Lead Channel Setting Pacing Pulse Width: 0.4 ms
Lead Channel Setting Sensing Sensitivity: 2.8 mV

## 2020-05-08 NOTE — Progress Notes (Signed)
Remote pacemaker transmission.   

## 2020-07-07 ENCOUNTER — Emergency Department (HOSPITAL_COMMUNITY)
Admission: EM | Admit: 2020-07-07 | Discharge: 2020-07-07 | Disposition: A | Discharge status: Home / Self Care | Payer: Medicare Other | Attending: Emergency Medicine | Admitting: Emergency Medicine

## 2020-07-07 ENCOUNTER — Other Ambulatory Visit: Payer: Self-pay

## 2020-07-07 ENCOUNTER — Emergency Department (HOSPITAL_COMMUNITY): Payer: Medicare Other

## 2020-07-07 ENCOUNTER — Encounter (HOSPITAL_COMMUNITY): Payer: Self-pay | Admitting: *Deleted

## 2020-07-07 DIAGNOSIS — Z96652 Presence of left artificial knee joint: Secondary | ICD-10-CM | POA: Insufficient documentation

## 2020-07-07 DIAGNOSIS — I4891 Unspecified atrial fibrillation: Secondary | ICD-10-CM | POA: Insufficient documentation

## 2020-07-07 DIAGNOSIS — Z950 Presence of cardiac pacemaker: Secondary | ICD-10-CM | POA: Diagnosis not present

## 2020-07-07 DIAGNOSIS — E119 Type 2 diabetes mellitus without complications: Secondary | ICD-10-CM | POA: Diagnosis not present

## 2020-07-07 DIAGNOSIS — Z79899 Other long term (current) drug therapy: Secondary | ICD-10-CM | POA: Insufficient documentation

## 2020-07-07 DIAGNOSIS — I129 Hypertensive chronic kidney disease with stage 1 through stage 4 chronic kidney disease, or unspecified chronic kidney disease: Secondary | ICD-10-CM | POA: Insufficient documentation

## 2020-07-07 DIAGNOSIS — Z85828 Personal history of other malignant neoplasm of skin: Secondary | ICD-10-CM | POA: Diagnosis not present

## 2020-07-07 DIAGNOSIS — Z7901 Long term (current) use of anticoagulants: Secondary | ICD-10-CM | POA: Insufficient documentation

## 2020-07-07 DIAGNOSIS — M25512 Pain in left shoulder: Secondary | ICD-10-CM | POA: Diagnosis not present

## 2020-07-07 DIAGNOSIS — R519 Headache, unspecified: Secondary | ICD-10-CM | POA: Insufficient documentation

## 2020-07-07 DIAGNOSIS — N184 Chronic kidney disease, stage 4 (severe): Secondary | ICD-10-CM | POA: Insufficient documentation

## 2020-07-07 DIAGNOSIS — Y9301 Activity, walking, marching and hiking: Secondary | ICD-10-CM | POA: Diagnosis not present

## 2020-07-07 DIAGNOSIS — S0512XA Contusion of eyeball and orbital tissues, left eye, initial encounter: Secondary | ICD-10-CM | POA: Insufficient documentation

## 2020-07-07 DIAGNOSIS — S8002XA Contusion of left knee, initial encounter: Secondary | ICD-10-CM | POA: Diagnosis not present

## 2020-07-07 DIAGNOSIS — W01198A Fall on same level from slipping, tripping and stumbling with subsequent striking against other object, initial encounter: Secondary | ICD-10-CM | POA: Insufficient documentation

## 2020-07-07 DIAGNOSIS — S8992XA Unspecified injury of left lower leg, initial encounter: Secondary | ICD-10-CM | POA: Diagnosis present

## 2020-07-07 DIAGNOSIS — W19XXXA Unspecified fall, initial encounter: Secondary | ICD-10-CM

## 2020-07-07 DIAGNOSIS — R42 Dizziness and giddiness: Secondary | ICD-10-CM | POA: Diagnosis not present

## 2020-07-07 HISTORY — DX: Chronic kidney disease, stage 4 (severe): N18.4

## 2020-07-07 LAB — URINALYSIS, ROUTINE W REFLEX MICROSCOPIC
Bilirubin Urine: NEGATIVE
Glucose, UA: NEGATIVE mg/dL
Hgb urine dipstick: NEGATIVE
Ketones, ur: NEGATIVE mg/dL
Nitrite: NEGATIVE
Protein, ur: NEGATIVE mg/dL
Specific Gravity, Urine: 1.01 (ref 1.005–1.030)
WBC, UA: >50 WBC/hpf — ABNORMAL HIGH (ref 0–5)
pH: 6 (ref 5.0–8.0)

## 2020-07-07 LAB — COMPREHENSIVE METABOLIC PANEL
ALT: 15 U/L (ref 0–44)
AST: 16 U/L (ref 15–41)
Albumin: 3.9 g/dL (ref 3.5–5.0)
Alkaline Phosphatase: 48 U/L (ref 38–126)
Anion gap: 8 (ref 5–15)
BUN: 35 mg/dL — ABNORMAL HIGH (ref 8–23)
CO2: 25 mmol/L (ref 22–32)
Calcium: 8.6 mg/dL — ABNORMAL LOW (ref 8.9–10.3)
Chloride: 100 mmol/L (ref 98–111)
Creatinine, Ser: 1.77 mg/dL — ABNORMAL HIGH (ref 0.44–1.00)
GFR, Estimated: 28 mL/min — ABNORMAL LOW (ref >60)
Glucose, Bld: 134 mg/dL — ABNORMAL HIGH (ref 70–99)
Potassium: 4.1 mmol/L (ref 3.5–5.1)
Sodium: 133 mmol/L — ABNORMAL LOW (ref 135–145)
Total Bilirubin: 0.4 mg/dL (ref 0.3–1.2)
Total Protein: 7.2 g/dL (ref 6.5–8.1)

## 2020-07-07 LAB — CBC WITH DIFFERENTIAL/PLATELET
Abs Immature Granulocytes: 0.06 K/uL (ref 0.00–0.07)
Basophils Absolute: 0 K/uL (ref 0.0–0.1)
Basophils Relative: 0 %
Eosinophils Absolute: 0.1 K/uL (ref 0.0–0.5)
Eosinophils Relative: 1 %
HCT: 35.8 % — ABNORMAL LOW (ref 36.0–46.0)
Hemoglobin: 11.5 g/dL — ABNORMAL LOW (ref 12.0–15.0)
Immature Granulocytes: 1 %
Lymphocytes Relative: 10 %
Lymphs Abs: 1.2 K/uL (ref 0.7–4.0)
MCH: 31.5 pg (ref 26.0–34.0)
MCHC: 32.1 g/dL (ref 30.0–36.0)
MCV: 98.1 fL (ref 80.0–100.0)
Monocytes Absolute: 0.9 K/uL (ref 0.1–1.0)
Monocytes Relative: 7 %
Neutro Abs: 10.1 K/uL — ABNORMAL HIGH (ref 1.7–7.7)
Neutrophils Relative %: 81 %
Platelets: 269 K/uL (ref 150–400)
RBC: 3.65 MIL/uL — ABNORMAL LOW (ref 3.87–5.11)
RDW: 13.2 % (ref 11.5–15.5)
WBC: 12.4 K/uL — ABNORMAL HIGH (ref 4.0–10.5)
nRBC: 0 % (ref 0.0–0.2)

## 2020-07-07 MED ORDER — OXYCODONE-ACETAMINOPHEN 5-325 MG PO TABS: 1.0000 | ORAL_TABLET | Freq: Three times a day (TID) | ORAL | 0 refills | Status: AC | PRN

## 2020-07-07 MED ORDER — MORPHINE SULFATE (PF) 4 MG/ML IV SOLN
4.0000 mg | Freq: Once | INTRAVENOUS | Status: AC
Start: 2020-07-07 — End: 2020-07-07
  Administered 2020-07-07: 4 mg via INTRAVENOUS
  Filled 2020-07-07: qty 1

## 2020-07-07 MED ORDER — CIPROFLOXACIN HCL 250 MG PO TABS: 250.0000 mg | ORAL_TABLET | Freq: Every day | ORAL | 0 refills | Status: AC

## 2020-07-07 MED ORDER — ONDANSETRON HCL 4 MG/2ML IJ SOLN
4.0000 mg | Freq: Once | INTRAMUSCULAR | Status: AC
  Administered 2020-07-07: 4 mg via INTRAVENOUS
  Filled 2020-07-07: qty 2

## 2020-07-07 NOTE — ED Triage Notes (Signed)
Pt brought in by [facility] from home with c/o feeling dizzy and attempted to catch herself on the wall. Pt hit her head on the wall and fell down to her knees. C/o left knee and head pain. Pt takes Eliquis. Denies LOC.

## 2020-07-07 NOTE — ED Provider Notes (Signed)
[facility] EMERGENCY DEPARTMENT Provider Note   CSN: [id number] Arrival date & time: 07/07/20  1303     History Chief Complaint  Patient presents with   Fall    Ashley Nguyen is a 87 y.o. female.  HPI  Patient with significant medical history of A. fib currently on Eliquis, pacemaker, CKD stage IV, hypertension, TIA presents to the emergency department with chief complaint of fall.  Patient states today she was walking up a ramp onto her deck and started to feel dizzy, feeling as if she was off balance, she fell onto her left side hitting her head left shoulder hip and knee.  She denies losing conscious, she is on anticoagulant.  Patient denies change in vision, paresthesias or weakness in the upper or lower extremities.  She endorses severe pain in her left knee, states it hurts when she tries to bend it, she denies  alleviating factors.  She also endorses a slight headache, the area where she hit her head.  prior to the patient's fall she denies feeling chest pain, short of breath, nausea vomiting, states that she had a similar episode on Wednesday and fell onto the ground.  Patient denies any alleviating factors.    Past Medical History:  Diagnosis Date   Arthritis    "hands, feet, knees, left hip" (07/04/2013)   Borderline diabetes    Cataracts, bilateral    GERD (gastroesophageal reflux disease)    Headache    "@ least monthly" (07/04/2013)   History of blood transfusion    "after taking Plavix; developed bleeding ulcer; had to have transfusions"   History of colon polyps    History of gastric ulcer    History of GI bleed    Hyperlipidemia    Hypertension    Impaired hearing    Iron deficiency anemia    at times has been on iron supplements   Kidney stones    "passed them"   Migraine    "none in 2015; used to come quite frequently" (07/04/2013)   Osteoarthritis    a. 04/2013 s/p L TKA.   PAF (paroxysmal atrial fibrillation) ([place])    a. 04/2013 periop L TKA.    Seasonal allergies    Skin cancer    "left cheek"    Stage 4 chronic kidney disease (H[facility])    Symptomatic bradycardia    TIA (transient ischemic attack) ~ 2002   Vertigo     Patient Active Problem List   Diagnosis Date Noted   Cardiac pacemaker in situ    Long term current use of anticoagulant therapy    PAF (paroxysmal atrial fibrillation) ([place]) 04/28/2013   History of GI bleed 04/25/2013   Hypertension    GERD (gastroesophageal reflux disease)    Hyperlipidemia    Diabetes mellitus without complication [facility])     Past Surgical History:  Procedure Laterality Date   APPENDECTOMY     as a child   COLONOSCOPY     ESOPHAGOGASTRODUODENOSCOPY     KNEE ARTHROSCOPY Left 11/2012   PACEMAKER INSERTION  07/04/2013   MDT dual chamber Advisa pacemaker implanted by Dr Caryl Comes for 2:1 AV block   PERMANENT PACEMAKER INSERTION N/A 07/04/2013   Procedure: PERMANENT PACEMAKER INSERTION;  Surgeon: Deboraha Sprang, MD;  Location: [facility] CATH LAB;  Service: Cardiovascular;  Laterality: N/A;   SKIN CANCER EXCISION Left ~ 2001   "cheek"   thumb surgery Right 1980's   "made it usable"   [place]  ~ 1942  TOTAL KNEE ARTHROPLASTY Left 04/25/2013   Procedure: TOTAL KNEE ARTHROPLASTY;  Surgeon: Renette Butters, MD;  Location: [place];  Service: Orthopedics;  Laterality: Left;   TUBAL LIGATION  ~ 1970     OB History   No obstetric history on file.     Family History  Problem Relation Age of Onset   Stroke Father    Heart attack Neg Hx    Heart disease Neg Hx    Diabetes Neg Hx     Social History   Tobacco Use   Smoking status: Never   Smokeless tobacco: Never  Vaping Use   Vaping Use: Never used  Substance Use Topics   Alcohol use: No   Drug use: No    Home Medications Prior to Admission medications   Medication Sig Start Date End Date Taking? Authorizing Provider  ciprofloxacin (CIPRO) 250 MG tablet Take 1 tablet (250 mg total) by mouth daily for 3 days. 07/07/20  07/10/20 Yes Marcello Fennel, PA-C  oxyCODONE-acetaminophen (PERCOCET/ROXICET) 5-325 MG tablet Take 1 tablet by mouth every 8 (eight) hours as needed for up to 4 days for severe pain. 07/07/20 07/11/20 Yes Marcello Fennel, PA-C  apixaban (ELIQUIS) 2.5 MG TABS tablet Take 2.5 mg by mouth 2 (two) times daily. 04/09/15   [provider]  Cholecalciferol 125 MCG (5000 UT) TABS Take 1 tablet by mouth daily.    [provider]  colesevelam (WELCHOL) 625 MG tablet Take 1,875 mg by mouth 2 (two) times daily with a meal.     [provider]  fluticasone (FLONASE) 50 MCG/ACT nasal spray Place 2 sprays into both nostrils daily.     [provider]  hydrALAZINE (APRESOLINE) 25 MG tablet Take 1 tablet by mouth 2 (two) times daily. 06/24/17   [provider]  lisinopril (PRINIVIL,ZESTRIL) 20 MG tablet Take 20 mg by mouth daily. 06/24/17   [provider]  metoprolol tartrate (LOPRESSOR) 25 MG tablet Take 1 tablet by mouth 2 (two) times daily. 06/24/17   [provider]  omeprazole (PRILOSEC) 20 MG capsule Take 20 mg by mouth daily.    [provider]    Allergies    Desipramine hcl, Plavix [clopidogrel bisulfate], Statins, Clindamycin, Clams [shellfish allergy], Lipitor [atorvastatin], Penicillins, Sulfa antibiotics, and Verapamil  Review of Systems   Review of Systems  Constitutional:  Negative for chills and fever.  HENT:  Negative for congestion.   Respiratory:  Negative for shortness of breath.   Cardiovascular:  Negative for chest pain.  Gastrointestinal:  Negative for abdominal pain, diarrhea, nausea and vomiting.  Genitourinary:  Negative for enuresis.  Musculoskeletal:  Negative for back pain.       Left shoulder, hip, knee pain  Skin:  Negative for rash.  Neurological:  Positive for dizziness.  Hematological:  Does not bruise/bleed easily.   Physical Exam Updated Vital Signs BP (!) 159/69   Pulse 77   Temp 97.6 F (36.4  C) (Oral)   Resp 20   Ht 5\' 5"  (1.651 m)   Wt 99.8 kg   SpO2 97%   BMI 36.61 kg/m   Physical Exam Vitals and nursing note reviewed.  Constitutional:      General: She is not in acute distress.    Appearance: She is not ill-appearing.  HENT:     Head: Normocephalic and atraumatic.     Comments: Patient has a large hematoma above the left eyebrow, there is no raccoon eyes or battle sign present.  Nose: No congestion.  Eyes:     Extraocular Movements: Extraocular movements intact.     Conjunctiva/sclera: Conjunctivae normal.     Pupils: Pupils are equal, round, and reactive to light.  Cardiovascular:     Rate and Rhythm: Normal rate and regular rhythm.     Pulses: Normal pulses.     Heart sounds: No murmur heard.   No friction rub. No gallop.     Comments: Chest was nontender to palpation. Pulmonary:     Effort: No respiratory distress.     Breath sounds: No wheezing, rhonchi or rales.  Abdominal:     Palpations: Abdomen is soft.     Tenderness: There is no abdominal tenderness. There is no right CVA tenderness or left CVA tenderness.  Musculoskeletal:        General: Swelling and tenderness present.     Cervical back: No rigidity.     Right lower leg: No edema.     Left lower leg: No edema.     Comments: Patient had full range of motion in all 4 extremities, neurovascular fully intact.  Patient's left knee was edematous and had ecchymosis present, she was tender to palpation along the patella, there is no gross deformities present, able to flex and extend at her knee but it causes her severe pain. Spine  was palpated nontender to palpation.  Skin:    General: Skin is warm and dry.  Neurological:     Mental Status: She is alert.     GCS: GCS eye subscore is 4. GCS verbal subscore is 5. GCS motor subscore is 6.     Cranial Nerves: No facial asymmetry.     Sensory: Sensation is intact.     Motor: No weakness.     Comments: Cranial nerves II through XII are grossly  intact  Patient having no difficulty with word finding.  Psychiatric:        Mood and Affect: Mood normal.    ED Results / Procedures / Treatments   Labs (all labs ordered are listed, but only abnormal results are displayed) Labs Reviewed  COMPREHENSIVE METABOLIC PANEL - Abnormal; Notable for the following components:      Result Value   Sodium 133 (*)    Glucose, Bld 134 (*)    BUN 35 (*)    Creatinine, Ser 1.77 (*)    Calcium 8.6 (*)    GFR, Estimated 28 (*)    All other components within normal limits  CBC WITH DIFFERENTIAL/PLATELET - Abnormal; Notable for the following components:   WBC 12.4 (*)    RBC 3.65 (*)    Hemoglobin 11.5 (*)    HCT 35.8 (*)    Neutro Abs 10.1 (*)    All other components within normal limits  URINALYSIS, ROUTINE W REFLEX MICROSCOPIC - Abnormal; Notable for the following components:   Leukocytes,Ua LARGE (*)    WBC, UA >50 (*)    Bacteria, UA FEW (*)    All other components within normal limits  URINE CULTURE    EKG None  Radiology CT Head Wo Contrast  Result Date: 07/07/2020 CLINICAL DATA:  Fall.  Head injury. EXAM: CT HEAD WITHOUT CONTRAST CT MAXILLOFACIAL WITHOUT CONTRAST TECHNIQUE: Multidetector CT imaging of the head and maxillofacial structures were performed using the standard protocol without intravenous contrast. Multiplanar CT image reconstructions of the maxillofacial structures were also generated. COMPARISON:  CT head 09/04/2009 FINDINGS: CT HEAD FINDINGS Brain: Progressive atrophy and progressive white matter changes. Extensive white  matter hypodensity bilaterally. Negative for acute infarct, hemorrhage, mass Vascular: Negative for hyperdense vessel Skull: Negative for skull fracture Other: Large left frontal scalp hematoma CT MAXILLOFACIAL FINDINGS Osseous: Negative for facial fracture Orbits: No orbital mass or edema. Sinuses: Mild mucosal edema paranasal sinuses. Negative for air-fluid level. Soft tissues: Large left frontal scalp  hematoma. IMPRESSION: 1. No acute intracranial abnormality 2. Progressive atrophy and chronic microvascular ischemia since 2011 3. Negative for facial fracture. Electronically Signed   By: Franchot Gallo M.D.   On: 07/07/2020 14:49   DG Shoulder Left  Result Date: 07/07/2020 CLINICAL DATA:  Left shoulder pain.  Fall. EXAM: LEFT SHOULDER - 2+ VIEW COMPARISON:  None. FINDINGS: There is no evidence of fracture or dislocation. Acromioclavicular and glenohumeral degenerative change. Soft tissues are unremarkable. Left-sided pacemaker partially visualized. No acute findings in the included left hemithorax. IMPRESSION: No fracture or subluxation of the left shoulder. Mild glenohumeral and acromioclavicular degenerative change. Electronically Signed   By: Keith Rake M.D.   On: 07/07/2020 15:07   DG Knee Complete 4 Views Left  Result Date: 07/07/2020 CLINICAL DATA:  Left knee pain, fall. EXAM: LEFT KNEE - COMPLETE 4+ VIEW COMPARISON:  None. FINDINGS: Left knee arthroplasty in expected alignment. No periprosthetic lucency or fracture. There has been patellar resurfacing. Bones are diffusely under mineralized. No significant joint effusion. Prominent soft tissue edema about the knee, most significantly anteriorly. IMPRESSION: 1. Prominent soft tissue edema about the knee. No acute fracture. 2. Left knee arthroplasty without complication. Electronically Signed   By: Keith Rake M.D.   On: 07/07/2020 15:09   DG Hip Unilat With Pelvis 2-3 Views Left  Result Date: 07/07/2020 CLINICAL DATA:  Left hip pain, fall. EXAM: DG HIP (WITH OR WITHOUT PELVIS) 2-3V LEFT COMPARISON:  None. FINDINGS: No fracture. Femoral head is well seated in the acetabulum. There is mild bilateral hip joint space narrowing and acetabular spurring. Pubic rami are intact. No evidence of a vascular necrosis or focal bone lesion. Soft tissues are unremarkable. IMPRESSION: 1. No fracture of the pelvis or left hip. 2. Mild bilateral hip  osteoarthritis. Electronically Signed   By: Keith Rake M.D.   On: 07/07/2020 15:08   CT Maxillofacial WO CM  Result Date: 07/07/2020 CLINICAL DATA:  Fall.  Head injury. EXAM: CT HEAD WITHOUT CONTRAST CT MAXILLOFACIAL WITHOUT CONTRAST TECHNIQUE: Multidetector CT imaging of the head and maxillofacial structures were performed using the standard protocol without intravenous contrast. Multiplanar CT image reconstructions of the maxillofacial structures were also generated. COMPARISON:  CT head 09/04/2009 FINDINGS: CT HEAD FINDINGS Brain: Progressive atrophy and progressive white matter changes. Extensive white matter hypodensity bilaterally. Negative for acute infarct, hemorrhage, mass Vascular: Negative for hyperdense vessel Skull: Negative for skull fracture Other: Large left frontal scalp hematoma CT MAXILLOFACIAL FINDINGS Osseous: Negative for facial fracture Orbits: No orbital mass or edema. Sinuses: Mild mucosal edema paranasal sinuses. Negative for air-fluid level. Soft tissues: Large left frontal scalp hematoma. IMPRESSION: 1. No acute intracranial abnormality 2. Progressive atrophy and chronic microvascular ischemia since 2011 3. Negative for facial fracture. Electronically Signed   By: Franchot Gallo M.D.   On: 07/07/2020 14:49    Procedures Procedures   Medications Ordered in ED Medications  morphine 4 MG/ML injection 4 mg (4 mg Intravenous Given 07/07/20 1514)  ondansetron (ZOFRAN) injection 4 mg (4 mg Intravenous Given 07/07/20 1514)    ED Course  I have reviewed the triage vital signs and the nursing notes.  Pertinent labs & imaging  results that were available during my care of the patient were reviewed by me and considered in my medical decision making (see chart for details).    MDM Rules/Calculators/A&P                         Initial impression-patient presents after a fall.  She is alert, does not appear acute distress, vital signs reassuring.Concern for orthopedic injury  will obtain imaging, add on basic lab work-up, provide patient pain medications and reassess.  Work-up-CBC shows slight leukocytosis of 12.4, normocytic anemia with hemoglobin 9.5, CMP shows slight hyponatremia 133, elevated glucose of 134, BUN elevated 35, creatinine 1.77, GFR 28.  UA shows large leukocytes, many white blood cells, few bacteria.  CT head and maxillofacial both negative for acute findings, x-ray of left shoulder, hip, knee all negative for acute findings.  EKG sinus without signs of ischemia.  Reassessment-patient was reassessed after morphine, states she is feeling much better, has no complaints this time.  Patient is updated on lab or imaging, she has no complaints, she is agreeable for discharge.  Rule out- low suspicion for CVA or intracranial head bleed as patient denies change in vision, paresthesias or weakness to upper lower extremities, no neuro deficits noted on exam, CT head did not reveal any acute findings.  Low suspicion for spinal cord abnormality or spinal fracture as spine was palpated nontender to palpation, no step-off or deformities present, patient moves all 4 extremities.  Low suspicion for rib fracture or pneumothorax as ribs are nontender to palpation, lung sounds are clear bilaterally.  Low suspicion for intra-abdominal abnormality as abdomen soft nontender to palpation.  Low suspicion for ACS or arrhythmias as patient denies chest pain, shortness of breath, no hypoperfusion or fluid overload on exam, EKG sinus without signs of ischemia.  Low suspicion for systemic infection as patient is nontoxic-appearing, vital signs reassuring, no obvious source infection noted on exam.  Low suspicion for hip fracture as there is no internal or external rotation of the hip, no leg shortening, patient is not overly tender on my exam.   patient has a creatinine of 1.77 and a GFR of 29 this appears to be a baseline as seen in her lab work in care everywhere.  Patient does have noted  leukocytosis I suspect this secondary due to UTI.   Plan-  Fall-suspect secondary due to UTI, will start patient on antibiotics, renally dose as she has stage IV CKD however follow-up with PCP for further evaluation.  Vital signs have remained stable, no indication for hospital admission.  Patient discussed with attending and they agreed with assessment and plan.  Patient given at home care as well strict return precautions.  Patient verbalized that they understood agreed to said plan.  Final Clinical Impression(s) / ED Diagnoses Final diagnoses:  Fall, initial encounter    Rx / DC Orders ED Discharge Orders          Ordered    ciprofloxacin (CIPRO) 250 MG tablet  Daily        07/07/20 1617    oxyCODONE-acetaminophen (PERCOCET/ROXICET) 5-325 MG tablet  Every 8 hours PRN        07/07/20 1617             Marcello Fennel, PA-C 07/07/20 1641    Milton Ferguson, MD 07/08/20 1011

## 2020-07-07 NOTE — Discharge Instructions (Addendum)
Imaging was reassuring, your lab work reveals that you have a UTI which I suspect is causing you to fall.  I have started you on an antibiotic please take as prescribed.  Please drink plenty of fluids, this will help with your symptoms.  I have also given you you pain medication, please beware this medication make you drowsy do not consume alcohol or or operate heavy machinery when taking this medication.  This medication has Tylenol in it do not take Tylenol when taking this medication.  Please follow-up with your PCP in the next 3 to 7 days for a repeat urine.  Come back to the emergency department if you develop chest pain, shortness of breath, severe abdominal pain, uncontrolled nausea, vomiting, diarrhea.

## 2020-07-07 NOTE — ED Notes (Signed)
Pt wheeled to family members car. Pt verbalized understanding of discharge instructions.

## 2020-07-09 LAB — URINE CULTURE: Culture: 30000 — AB

## 2020-07-16 DIAGNOSIS — N3281 Overactive bladder: Secondary | ICD-10-CM | POA: Diagnosis present

## 2020-07-25 ENCOUNTER — Ambulatory Visit (INDEPENDENT_AMBULATORY_CARE_PROVIDER_SITE_OTHER): Payer: Medicare Other

## 2020-07-25 DIAGNOSIS — I495 Sick sinus syndrome: Secondary | ICD-10-CM

## 2020-07-27 LAB — CUP PACEART REMOTE DEVICE CHECK
Battery Remaining Longevity: 50 mo
Battery Voltage: 2.98 V
Brady Statistic AP VP Percent: 0.01 %
Brady Statistic AP VS Percent: 13.62 %
Brady Statistic AS VP Percent: 0.03 %
Brady Statistic AS VS Percent: 86.34 %
Brady Statistic RA Percent Paced: 13.63 %
Brady Statistic RV Percent Paced: 0.04 %
Date Time Interrogation Session: 20220708084108
Implantable Lead Implant Date: 20150616
Implantable Lead Implant Date: 20150616
Implantable Lead Location: 753859
Implantable Lead Location: 753860
Implantable Lead Model: 5076
Implantable Lead Model: 5076
Implantable Lead Serial Number: 3746147
Implantable Pulse Generator Implant Date: 20150616
Lead Channel Impedance Value: 361 Ohm
Lead Channel Impedance Value: 399 Ohm
Lead Channel Impedance Value: 494 Ohm
Lead Channel Impedance Value: 513 Ohm
Lead Channel Pacing Threshold Amplitude: 0.875 V
Lead Channel Pacing Threshold Amplitude: 0.875 V
Lead Channel Pacing Threshold Pulse Width: 0.4 ms
Lead Channel Pacing Threshold Pulse Width: 0.4 ms
Lead Channel Sensing Intrinsic Amplitude: 14.625 mV
Lead Channel Sensing Intrinsic Amplitude: 14.625 mV
Lead Channel Sensing Intrinsic Amplitude: 2.125 mV
Lead Channel Sensing Intrinsic Amplitude: 2.125 mV
Lead Channel Setting Pacing Amplitude: 1.75 V
Lead Channel Setting Pacing Amplitude: 2 V
Lead Channel Setting Pacing Pulse Width: 0.4 ms
Lead Channel Setting Sensing Sensitivity: 2.8 mV

## 2020-08-16 NOTE — Progress Notes (Signed)
Remote pacemaker transmission.   

## 2020-10-14 ENCOUNTER — Encounter: Payer: Self-pay | Admitting: Cardiology

## 2020-10-14 ENCOUNTER — Ambulatory Visit (INDEPENDENT_AMBULATORY_CARE_PROVIDER_SITE_OTHER): Payer: Medicare Other | Admitting: Cardiology

## 2020-10-14 VITALS — BP 92/64 | HR 68 | Ht 65.0 in | Wt 214.8 lb

## 2020-10-14 DIAGNOSIS — I480 Paroxysmal atrial fibrillation: Secondary | ICD-10-CM

## 2020-10-14 DIAGNOSIS — I495 Sick sinus syndrome: Secondary | ICD-10-CM | POA: Diagnosis not present

## 2020-10-14 DIAGNOSIS — I10 Essential (primary) hypertension: Secondary | ICD-10-CM | POA: Diagnosis not present

## 2020-10-14 NOTE — Patient Instructions (Signed)
Medication Instructions:  Continue all current medications.   Labwork: none  Testing/Procedures: none  Follow-Up: 6 months   Any Other Special Instructions Will Be Listed Below (If Applicable).   If you need a refill on your cardiac medications before your next appointment, please call your pharmacy.  

## 2020-10-14 NOTE — Progress Notes (Signed)
Clinical Summary Ashley Nguyen is a 87 y.o.female seen today for follow up of the following medical problems.    1. PAF - no palpitations - compliant withmeds   2. Pacemaker -07/2020 normal device function     3. HTN - we have deferred ACEI use to her neprhologist.   -she is compliant with meds     4. CKD IV - followed [facility].        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, stationed in [place]   Past Medical History:  Diagnosis Date   Arthritis    "hands, feet, knees, left hip" (07/04/2013)   Borderline diabetes    Cataracts, bilateral    GERD (gastroesophageal reflux disease)    Headache    "@ least monthly" (07/04/2013)   History of blood transfusion    "after taking Plavix; developed bleeding ulcer; had to have transfusions"   History of colon polyps    History of gastric ulcer    History of GI bleed    Hyperlipidemia    Hypertension    Impaired hearing    Iron deficiency anemia    at times has been on iron supplements   Kidney stones    "passed them"   Migraine    "none in 2015; used to come quite frequently" (07/04/2013)   Osteoarthritis    a. 04/2013 s/p L TKA.   PAF (paroxysmal atrial fibrillation) ([place])    a. 04/2013 periop L TKA.   Seasonal allergies    Skin cancer    "left cheek"    Stage 4 chronic kidney disease (HCC)    Symptomatic bradycardia    TIA (transient ischemic attack) ~ 2002   Vertigo      Allergies  Allergen Reactions   Desipramine Hcl Other (See Comments)    "painful urination, then kidneys shut down for hours."   Plavix [Clopidogrel Bisulfate] Other (See Comments)    "Almost bled to death."   Statins Other (See Comments)    Extreme pain in legs with some swelling   Clindamycin Other (See Comments)    Patient complains of severe stomach burning.   Clams [Shellfish Allergy] Nausea And Vomiting    Per pt, NOT a general shellfish allergy.  Clams only.   Lipitor  [Atorvastatin] Other (See Comments)    Muscle aches.   Penicillins Hives   Sulfa Antibiotics Hives   Verapamil Swelling     Current Outpatient Medications  Medication Sig Dispense Refill   apixaban (ELIQUIS) 2.5 MG TABS tablet Take 2.5 mg by mouth 2 (two) times daily.     Cholecalciferol 125 MCG (5000 UT) TABS Take 1 tablet by mouth daily.     colesevelam (WELCHOL) 625 MG tablet Take 1,875 mg by mouth 2 (two) times daily with a meal.      fluticasone (FLONASE) 50 MCG/ACT nasal spray Place 2 sprays into both nostrils daily.      hydrALAZINE (APRESOLINE) 25 MG tablet Take 1 tablet by mouth 2 (two) times daily.     lisinopril (PRINIVIL,ZESTRIL) 20 MG tablet Take 20 mg by mouth daily.     metoprolol tartrate (LOPRESSOR) 25 MG tablet Take 1 tablet by mouth 2 (two) times daily.     omeprazole (PRILOSEC) 20 MG capsule Take 20 mg by mouth daily.     tolterodine (DETROL) 1 MG tablet Take 1 mg by mouth 2 (two) times daily.     No current facility-administered medications  for this visit.     Past Surgical History:  Procedure Laterality Date   APPENDECTOMY     as a child   COLONOSCOPY     ESOPHAGOGASTRODUODENOSCOPY     KNEE ARTHROSCOPY Left 11/2012   PACEMAKER INSERTION  07/04/2013   MDT dual chamber Advisa pacemaker implanted by Dr Caryl Comes for 2:1 AV block   PERMANENT PACEMAKER INSERTION N/A 07/04/2013   Procedure: PERMANENT PACEMAKER INSERTION;  Surgeon: Deboraha Sprang, MD;  Location: [facility] CATH LAB;  Service: Cardiovascular;  Laterality: N/A;   SKIN CANCER EXCISION Left ~ 2001   "cheek"   thumb surgery Right 1980's   "made it usable"   TONSILLECTOMY AND ADENOIDECTOMY  ~ [place] Left 04/25/2013   Procedure: TOTAL KNEE ARTHROPLASTY;  Surgeon: Renette Butters, MD;  Location: [place];  Service: Orthopedics;  Laterality: Left;   TUBAL LIGATION  ~ 1970     Allergies  Allergen Reactions   Desipramine Hcl Other (See Comments)    "painful urination, then kidneys shut down  for hours."   Plavix [Clopidogrel Bisulfate] Other (See Comments)    "Almost bled to death."   Statins Other (See Comments)    Extreme pain in legs with some swelling   Clindamycin Other (See Comments)    Patient complains of severe stomach burning.   Clams [Shellfish Allergy] Nausea And Vomiting    Per pt, NOT a general shellfish allergy.  Clams only.   Lipitor [Atorvastatin] Other (See Comments)    Muscle aches.   Penicillins Hives   Sulfa Antibiotics Hives   Verapamil Swelling      Family History  Problem Relation Age of Onset   Stroke Father    Heart attack Neg Hx    Heart disease Neg Hx    Diabetes Neg Hx      Social History [name] reports that she has never smoked. She has never used smokeless tobacco. [name] reports no history of alcohol use.   Review of Systems CONSTITUTIONAL: No weight loss, fever, chills, weakness or fatigue.  HEENT: Eyes: No visual loss, blurred vision, double vision or yellow sclerae.No hearing loss, sneezing, congestion, runny nose or sore throat.  SKIN: No rash or itching.  CARDIOVASCULAR: per hpi RESPIRATORY: No shortness of breath, cough or sputum.  GASTROINTESTINAL: No anorexia, nausea, vomiting or diarrhea. No abdominal pain or blood.  GENITOURINARY: No burning on urination, no polyuria NEUROLOGICAL: No headache, dizziness, syncope, paralysis, ataxia, numbness or tingling in the extremities. No change in bowel or bladder control.  MUSCULOSKELETAL: No muscle, back pain, joint pain or stiffness.  LYMPHATICS: No enlarged nodes. No history of splenectomy.  PSYCHIATRIC: No history of depression or anxiety.  ENDOCRINOLOGIC: No reports of sweating, cold or heat intolerance. No polyuria or polydipsia.  [name]   Physical Examination Vitals:   10/14/20 1054  BP: 92/64  Pulse: 68  SpO2: 98%   Filed Weights   10/14/20 1054  Weight: 214 lb 12.8 oz (97.4 kg)    Gen: resting comfortably, no acute distress HEENT: no scleral  icterus, pupils equal round and reactive, no palptable cervical adenopathy,  CV: RRR, no mr/g no jvd Resp: Clear to auscultation bilaterally GI: abdomen is soft, non-tender, non-distended, normal bowel sounds, no hepatosplenomegaly MSK: extremities are warm, no edema.  Skin: warm, no rash Neuro:  no focal deficits Psych: appropriate affect     Assessment and Plan  1. PAF -no recent symptmos, continue current meds   2. Symptomatic bradycardia -  normal recent device check and no symptoms, continue to monitor   3. HTN - manual recheck was 115/65 at goal, continue current meds      Arnoldo Lenis, M.D.

## 2020-10-24 ENCOUNTER — Ambulatory Visit (INDEPENDENT_AMBULATORY_CARE_PROVIDER_SITE_OTHER): Payer: Medicare Other

## 2020-10-24 DIAGNOSIS — I495 Sick sinus syndrome: Secondary | ICD-10-CM | POA: Diagnosis not present

## 2020-10-28 LAB — CUP PACEART REMOTE DEVICE CHECK
Date Time Interrogation Session: 20221010114050
Implantable Lead Implant Date: 20150616
Implantable Lead Implant Date: 20150616
Implantable Lead Location: 753859
Implantable Lead Location: 753860
Implantable Lead Model: 5076
Implantable Lead Model: 5076
Implantable Lead Serial Number: 3746147
Implantable Pulse Generator Implant Date: 20150616

## 2020-11-04 NOTE — Progress Notes (Signed)
Remote pacemaker transmission.   

## 2020-11-19 ENCOUNTER — Emergency Department (HOSPITAL_COMMUNITY): Payer: Medicare Other

## 2020-11-19 ENCOUNTER — Encounter (HOSPITAL_COMMUNITY): Payer: Self-pay | Admitting: *Deleted

## 2020-11-19 ENCOUNTER — Emergency Department (HOSPITAL_COMMUNITY)
Admission: EM | Admit: 2020-11-19 | Discharge: 2020-11-19 | Disposition: A | Discharge status: Home / Self Care | Payer: Medicare Other | Attending: Emergency Medicine | Admitting: Emergency Medicine

## 2020-11-19 DIAGNOSIS — M79631 Pain in right forearm: Secondary | ICD-10-CM

## 2020-11-19 DIAGNOSIS — S59911A Unspecified injury of right forearm, initial encounter: Secondary | ICD-10-CM | POA: Diagnosis present

## 2020-11-19 DIAGNOSIS — S50811A Abrasion of right forearm, initial encounter: Secondary | ICD-10-CM | POA: Diagnosis not present

## 2020-11-19 DIAGNOSIS — I480 Paroxysmal atrial fibrillation: Secondary | ICD-10-CM | POA: Insufficient documentation

## 2020-11-19 DIAGNOSIS — W1830XA Fall on same level, unspecified, initial encounter: Secondary | ICD-10-CM | POA: Diagnosis not present

## 2020-11-19 DIAGNOSIS — Z85828 Personal history of other malignant neoplasm of skin: Secondary | ICD-10-CM | POA: Diagnosis not present

## 2020-11-19 DIAGNOSIS — Z79899 Other long term (current) drug therapy: Secondary | ICD-10-CM | POA: Diagnosis not present

## 2020-11-19 DIAGNOSIS — N184 Chronic kidney disease, stage 4 (severe): Secondary | ICD-10-CM | POA: Insufficient documentation

## 2020-11-19 DIAGNOSIS — I129 Hypertensive chronic kidney disease with stage 1 through stage 4 chronic kidney disease, or unspecified chronic kidney disease: Secondary | ICD-10-CM | POA: Insufficient documentation

## 2020-11-19 DIAGNOSIS — R42 Dizziness and giddiness: Secondary | ICD-10-CM | POA: Insufficient documentation

## 2020-11-19 DIAGNOSIS — Z7901 Long term (current) use of anticoagulants: Secondary | ICD-10-CM | POA: Insufficient documentation

## 2020-11-19 DIAGNOSIS — Z96652 Presence of left artificial knee joint: Secondary | ICD-10-CM | POA: Diagnosis not present

## 2020-11-19 DIAGNOSIS — Z950 Presence of cardiac pacemaker: Secondary | ICD-10-CM | POA: Insufficient documentation

## 2020-11-19 DIAGNOSIS — Y92009 Unspecified place in unspecified non-institutional (private) residence as the place of occurrence of the external cause: Secondary | ICD-10-CM | POA: Insufficient documentation

## 2020-11-19 DIAGNOSIS — E1122 Type 2 diabetes mellitus with diabetic chronic kidney disease: Secondary | ICD-10-CM | POA: Insufficient documentation

## 2020-11-19 NOTE — ED Notes (Signed)
Ice pack applied to right arm

## 2020-11-19 NOTE — ED Triage Notes (Signed)
Fall at home, laceration to right forearm

## 2020-11-19 NOTE — ED Notes (Signed)
Portable Xray done.

## 2020-11-19 NOTE — ED Provider Notes (Signed)
[facility] EMERGENCY DEPARTMENT Provider Note   CSN: [id number] Arrival date & time: 11/19/20  1021     History No chief complaint on file.   Ashley Nguyen is a 87 y.o. female.  HPI  Patient with significant medical history of A. fib, currently on Eliquis, pacemaker, CKD stage 4, hypertension, TIA presents with chief complaint of a fall.  Patient is here today when she got up from her bed to use the restroom early morning felt slightly off balance and fell onto her right side.  She denies hitting her head, losing conscious, she states that she needed some help getting up off the floor.  She states she was having some right forearm pain.  She denies  headaches, change in vision, paresthesias or weakness the upper lower extremities, she denies  neck pain, back pain, chest pain, abdominal pain, pain in her lower extremities.  She states prior to her falling she denies chest pain, shortness of breath, feeling lightheaded, she states she felt dizzy but she states she generally feels dizzy when she goes from sitting to standing position felt like she did not wait long enough for this to subside.  She has no other complaints at this time.  She denies any alleviating or aggravating factors.  Past Medical History:  Diagnosis Date   Arthritis    "hands, feet, knees, left hip" (07/04/2013)   Borderline diabetes    Cataracts, bilateral    GERD (gastroesophageal reflux disease)    Headache    "@ least monthly" (07/04/2013)   History of blood transfusion    "after taking Plavix; developed bleeding ulcer; had to have transfusions"   History of colon polyps    History of gastric ulcer    History of GI bleed    Hyperlipidemia    Hypertension    Impaired hearing    Iron deficiency anemia    at times has been on iron supplements   Kidney stones    "passed them"   Migraine    "none in 2015; used to come quite frequently" (07/04/2013)   Osteoarthritis    a. 04/2013 s/p L TKA.   PAF (paroxysmal  atrial fibrillation) ([place])    a. 04/2013 periop L TKA.   Seasonal allergies    Skin cancer    "left cheek"    Stage 4 chronic kidney disease (HCC)    Symptomatic bradycardia    TIA (transient ischemic attack) ~ 2002   Vertigo     Patient Active Problem List   Diagnosis Date Noted   Cardiac pacemaker in situ    Long term current use of anticoagulant therapy    PAF (paroxysmal atrial fibrillation) ([place]) 04/28/2013   History of GI bleed 04/25/2013   Hypertension    GERD (gastroesophageal reflux disease)    Hyperlipidemia    Diabetes mellitus without complication [facility])     Past Surgical History:  Procedure Laterality Date   APPENDECTOMY     as a child   COLONOSCOPY     ESOPHAGOGASTRODUODENOSCOPY     KNEE ARTHROSCOPY Left 11/2012   PACEMAKER INSERTION  07/04/2013   MDT dual chamber Advisa pacemaker implanted by Dr Caryl Comes for 2:1 AV block   PERMANENT PACEMAKER INSERTION N/A 07/04/2013   Procedure: PERMANENT PACEMAKER INSERTION;  Surgeon: Deboraha Sprang, MD;  Location: [facility] CATH LAB;  Service: Cardiovascular;  Laterality: N/A;   SKIN CANCER EXCISION Left ~ 2001   "cheek"   thumb surgery Right 1980's   "made it usable"  TONSILLECTOMY AND ADENOIDECTOMY  ~ [place] ARTHROPLASTY Left 04/25/2013   Procedure: TOTAL KNEE ARTHROPLASTY;  Surgeon: Renette Butters, MD;  Location: [place];  Service: Orthopedics;  Laterality: Left;   TUBAL LIGATION  ~ 1970     OB History   No obstetric history on file.     Family History  Problem Relation Age of Onset   Stroke Father    Heart attack Neg Hx    Heart disease Neg Hx    Diabetes Neg Hx     Social History   Tobacco Use   Smoking status: Never   Smokeless tobacco: Never  Vaping Use   Vaping Use: Never used  Substance Use Topics   Alcohol use: No   Drug use: No    Home Medications Prior to Admission medications   Medication Sig Start Date End Date Taking? Authorizing Provider  apixaban (ELIQUIS) 2.5 MG TABS tablet Take  2.5 mg by mouth 2 (two) times daily. 04/09/15   [provider]  Cholecalciferol 125 MCG (5000 UT) TABS Take 1 tablet by mouth daily.    [provider]  colesevelam (WELCHOL) 625 MG tablet Take 1,875 mg by mouth 2 (two) times daily with a meal.     [provider]  fluticasone (FLONASE) 50 MCG/ACT nasal spray Place 2 sprays into both nostrils daily.     [provider]  hydrALAZINE (APRESOLINE) 25 MG tablet Take 1 tablet by mouth 2 (two) times daily. 06/24/17   [provider]  lisinopril (PRINIVIL,ZESTRIL) 20 MG tablet Take 20 mg by mouth daily. 06/24/17   [provider]  metoprolol tartrate (LOPRESSOR) 25 MG tablet Take 1 tablet by mouth 2 (two) times daily. 06/24/17   [provider]  omeprazole (PRILOSEC) 20 MG capsule Take 20 mg by mouth daily.    [provider]  tolterodine (DETROL) 1 MG tablet Take 1 mg by mouth 2 (two) times daily. 08/05/20   [provider]    Allergies    Desipramine hcl, Plavix [clopidogrel bisulfate], Statins, Clindamycin, Clams [shellfish allergy], Lipitor [atorvastatin], Penicillins, Sulfa antibiotics, and Verapamil  Review of Systems   Review of Systems  Constitutional:  Negative for chills and fever.  HENT:  Negative for congestion.   Respiratory:  Negative for shortness of breath.   Cardiovascular:  Negative for chest pain.  Gastrointestinal:  Negative for abdominal pain.  Genitourinary:  Negative for enuresis.  Musculoskeletal:  Negative for back pain.       Right forearm pain.  Skin:  Negative for rash.  Neurological:  Negative for dizziness.  Hematological:  Does not bruise/bleed easily.   Physical Exam Updated Vital Signs BP 134/72 (BP Location: Left Arm)   Pulse 75   Temp 97.6 F (36.4 C) (Oral)   Resp 18   Ht 5' 5.5" (1.664 m)   Wt 99.8 kg   SpO2 99%   BMI 36.05 kg/m   Physical Exam Vitals and nursing note reviewed.  Constitutional:      General: She is not  in acute distress.    Appearance: She is not ill-appearing.  HENT:     Head: Normocephalic and atraumatic.     Comments: No deformities of the head present, no raccoon eyes or battle sign noted.  Head was nontender to palpation.    Nose: No congestion.     Mouth/Throat:     Mouth: Mucous membranes are moist.     Pharynx: Oropharynx is clear.  Eyes:  Extraocular Movements: Extraocular movements intact.     Conjunctiva/sclera: Conjunctivae normal.     Pupils: Pupils are equal, round, and reactive to light.  Cardiovascular:     Rate and Rhythm: Normal rate and regular rhythm.     Pulses: Normal pulses.     Heart sounds: No murmur heard.   No friction rub. No gallop.  Pulmonary:     Effort: No respiratory distress.     Breath sounds: No wheezing, rhonchi or rales.  Chest:     Chest wall: No tenderness.  Abdominal:     Palpations: Abdomen is soft.     Tenderness: There is no abdominal tenderness. There is no right CVA tenderness or left CVA tenderness.  Musculoskeletal:     Cervical back: No tenderness.     Comments: Spine was palpated was nontender to palpation, she did have some tenderness along the posterior aspect of the right sixth rib, no crepitus or deformities present.  Patient is full range of motion, 5 of 5 strength, neuro vas intact the upper lower extremities.  She is slightly tender on the medial aspect of the right forearm small abrasion present hemodynamically stable.   Skin:    General: Skin is warm and dry.  Neurological:     Mental Status: She is alert.     Comments: No facial asymmetry, no difficult word finding, no slurring of words, able to follow two-step commands, no unilateral weakness present, gait was fully intact.  Psychiatric:        Mood and Affect: Mood normal.    ED Results / Procedures / Treatments   Labs (all labs ordered are listed, but only abnormal results are displayed) Labs Reviewed - No data to display  EKG None  Radiology DG Ribs  Unilateral W/Chest Right  Result Date: 11/19/2020 CLINICAL DATA:  Tenderness along the posterior aspect of the right fifth sixth rib. EXAM: RIGHT RIBS AND CHEST - 3+ VIEW COMPARISON:  None. FINDINGS: No fracture or other bone lesions are seen involving the ribs. There is no evidence of pneumothorax or pleural effusion. Both lungs are clear. Heart size and mediastinal contours are within normal limits. Left chest wall dual lead pacemaker. IMPRESSION: Negative. Electronically Signed   By: Macy Mis M.D.   On: 11/19/2020 14:57   DG Forearm Right  Result Date: 11/19/2020 CLINICAL DATA:  Fall, right forearm pain. EXAM: RIGHT FOREARM - 2 VIEW COMPARISON:  None. FINDINGS: There is no evidence of fracture or other focal bone lesions. Alignment is unremarkable. No foreign body. IMPRESSION: Negative. Electronically Signed   By: Merilyn Baba M.D.   On: 11/19/2020 11:57   CT Head Wo Contrast  Result Date: 11/19/2020 CLINICAL DATA:  Patient fell on 2 her right side earlier today. Possible head injury. EXAM: CT HEAD WITHOUT CONTRAST TECHNIQUE: Contiguous axial images were obtained from the base of the skull through the vertex without intravenous contrast. COMPARISON:  07/07/2020. FINDINGS: Brain: No evidence of acute infarction, hemorrhage, hydrocephalus, extra-axial collection or mass lesion/mass effect. There is ventricular and sulcal enlargement reflecting age-appropriate volume loss. Bilateral white matter hypoattenuation is also present consistent with advanced chronic microvascular ischemic change. These findings are stable. Vascular: No hyperdense vessel or unexpected calcification. Skull: Normal. Negative for fracture or focal lesion. Sinuses/Orbits: Unremarkable globes and orbits.  Sinuses are clear. Other: None. IMPRESSION: 1. No acute intracranial abnormalities. 2. Age-appropriate volume loss. Advanced chronic microvascular ischemic change. Electronically Signed   By: Lajean Manes M.D.   On: 11/19/2020  15:04    Procedures Procedures   Medications Ordered in ED Medications - No data to display  ED Course  I have reviewed the triage vital signs and the nursing notes.  Pertinent labs & imaging results that were available during my care of the patient were reviewed by me and considered in my medical decision making (see chart for details).    MDM Rules/Calculators/A&P                          Initial impression-presents after a fall.  She is alert, does not appear in acute stress, vital signs are reassuring.  Concern for orthopedic injuries will obtain imaging of her right forearm, designated rib x-ray as well as adding a CT head due to her anticoagulants.  Work-up-CT head negative for acute findings, ribs negative for acute findings, or right forearm negative for acute findings.  Rule out- low suspicion for intracranial head bleed as patient denies loss of conscious, is not on anticoagulant, she does not endorse headaches, paresthesia/weakness in the upper and lower extremities, no focal deficits present on my exam, CT head negative for acute findings.  Low suspicion for spinal cord abnormality or spinal fracture spine was palpated was nontender to palpation, patient has full range of motion in the upper and lower extremities.  Low suspicion for pneumothorax as lung sounds are clear bilaterally, x-ray is negative for acute findings.  Low suspicion for intra-abdominal trauma as abdomen soft nontender to palpation.  Low suspicion for orthopedic injury as imaging is negative for acute findings.   Plan-  Right forearm pain-likely muscular strain, will recommend over-the-counter pain medications, applying ice to the area, follow-up PCP as needed.  Patient was given strict return precautions.  Vital signs have remained stable, no indication for hospital admission.    Patient given at home care as well strict return precautions.  Patient verbalized that they understood agreed to said  plan.  Final Clinical Impression(s) / ED Diagnoses Final diagnoses:  Right forearm pain    Rx / DC Orders ED Discharge Orders     None        Marcello Fennel, PA-C 11/19/20 1609    Fredia Sorrow, MD 11/20/20 [phone]

## 2020-11-19 NOTE — ED Notes (Signed)
Dressing to right forearm.  Area cleaned with wound cleaner.

## 2020-11-19 NOTE — Discharge Instructions (Signed)
Imaging was all reassuring.  Recommend over-the-counter pain medication as needed, please keep your right forearm elevated while not use, apply ice and/or heat to the area to help with pain.  Please keep the area clean dry and other dressings 1-2 times daily.  Follow-up with your PCP for further evaluation.  Come back to the emergency department if you develop chest pain, shortness of breath, severe abdominal pain, uncontrolled nausea, vomiting, diarrhea.

## 2020-12-17 ENCOUNTER — Other Ambulatory Visit: Payer: Self-pay | Admitting: Family Medicine

## 2020-12-17 ENCOUNTER — Telehealth: Payer: Self-pay

## 2020-12-17 DIAGNOSIS — R42 Dizziness and giddiness: Secondary | ICD-10-CM

## 2020-12-17 NOTE — Telephone Encounter (Signed)
NOTES SCANNED TO REFERRAL 

## 2020-12-18 ENCOUNTER — Telehealth (HOSPITAL_COMMUNITY): Payer: Self-pay | Admitting: *Deleted

## 2020-12-18 ENCOUNTER — Ambulatory Visit (HOSPITAL_COMMUNITY): Payer: Medicare Other | Admitting: Physician Assistant

## 2020-12-18 NOTE — Telephone Encounter (Signed)
Transmission received at 15:04. Routing to [employer]. Presenting rhythm SR 73

## 2020-12-18 NOTE — Telephone Encounter (Signed)
Per Adline Peals PA pt should follow up with primary cardiologist for recent symptoms of syncope per PCP office 12/16/20. No afib or arrhythmia noted on device transmission. Will forward to Dr. Harl Bowie office to reach out for appointment for follow up.

## 2020-12-18 NOTE — Progress Notes (Incomplete)
Primary Care Physician: Aletha Halim., PA-C Primary Cardiologist: Dr Harl Bowie Primary Electrophysiologist: Dr Caryl Comes Referring Physician: Bing Matter   Ashley Nguyen is a 87 y.o. female with a history of TIA, HTN, HLD, 2nd degree AV block s/p PPM, CKD stabe IV, atrial fibrillation who presents for follow up in the [facility]. Patient is on Eliquis for a CHADS2VASC score of 6. ***dizziness  Today, she denies symptoms of ***palpitations, chest pain, shortness of breath, orthopnea, PND, lower extremity edema, dizziness, presyncope, syncope, snoring, daytime somnolence, bleeding, or neurologic sequela. The patient is tolerating medications without difficulties and is otherwise without complaint today.    Atrial Fibrillation Risk Factors:  she does not have symptoms or diagnosis of sleep apnea. she does not have a history of rheumatic fever. she {Action; does/does not:19097} have a history of alcohol use. The patient {Action; does/does not:19097} have a history of early familial atrial fibrillation or other arrhythmias.  she has a BMI of There is no height or weight on file to calculate BMI.. There were no vitals filed for this visit.  Family History  Problem Relation Age of Onset   Stroke Father    Heart attack Neg Hx    Heart disease Neg Hx    Diabetes Neg Hx      Atrial Fibrillation Management history:  Previous antiarrhythmic drugs: none Previous cardioversions: none Previous ablations: none CHADS2VASC score: 6 Anticoagulation history: Eliquis   Past Medical History:  Diagnosis Date   Arthritis    "hands, feet, knees, left hip" (07/04/2013)   Borderline diabetes    Cataracts, bilateral    GERD (gastroesophageal reflux disease)    Headache    "@ least monthly" (07/04/2013)   History of blood transfusion    "after taking Plavix; developed bleeding ulcer; had to have transfusions"   History of colon polyps    History of gastric  ulcer    History of GI bleed    Hyperlipidemia    Hypertension    Impaired hearing    Iron deficiency anemia    at times has been on iron supplements   Kidney stones    "passed them"   Migraine    "none in 2015; used to come quite frequently" (07/04/2013)   Osteoarthritis    a. 04/2013 s/p L TKA.   PAF (paroxysmal atrial fibrillation) ([place])    a. 04/2013 periop L TKA.   Seasonal allergies    Skin cancer    "left cheek"    Stage 4 chronic kidney disease (HCC)    Symptomatic bradycardia    TIA (transient ischemic attack) ~ 2002   Vertigo    Past Surgical History:  Procedure Laterality Date   APPENDECTOMY     as a child   COLONOSCOPY     ESOPHAGOGASTRODUODENOSCOPY     KNEE ARTHROSCOPY Left 11/2012   PACEMAKER INSERTION  07/04/2013   MDT dual chamber Advisa pacemaker implanted by Dr Caryl Comes for 2:1 AV block   PERMANENT PACEMAKER INSERTION N/A 07/04/2013   Procedure: PERMANENT PACEMAKER INSERTION;  Surgeon: Deboraha Sprang, MD;  Location: [facility] CATH LAB;  Service: Cardiovascular;  Laterality: N/A;   SKIN CANCER EXCISION Left ~ 2001   "cheek"   thumb surgery Right 1980's   "made it usable"   TONSILLECTOMY AND ADENOIDECTOMY  ~ [place] Left 04/25/2013   Procedure: TOTAL KNEE ARTHROPLASTY;  Surgeon: Renette Butters, MD;  Location: [place];  Service: Orthopedics;  Laterality: Left;  TUBAL LIGATION  ~ 1970    Current Outpatient Medications  Medication Sig Dispense Refill   apixaban (ELIQUIS) 2.5 MG TABS tablet Take 2.5 mg by mouth 2 (two) times daily.     Cholecalciferol 125 MCG (5000 UT) TABS Take 1 tablet by mouth daily.     colesevelam (WELCHOL) 625 MG tablet Take 1,875 mg by mouth 2 (two) times daily with a meal.      fluticasone (FLONASE) 50 MCG/ACT nasal spray Place 2 sprays into both nostrils daily.      hydrALAZINE (APRESOLINE) 25 MG tablet Take 1 tablet by mouth 2 (two) times daily.     lisinopril (PRINIVIL,ZESTRIL) 20 MG tablet Take 20 mg by mouth daily.      metoprolol tartrate (LOPRESSOR) 25 MG tablet Take 1 tablet by mouth 2 (two) times daily.     omeprazole (PRILOSEC) 20 MG capsule Take 20 mg by mouth daily.     tolterodine (DETROL) 1 MG tablet Take 1 mg by mouth 2 (two) times daily.     No current facility-administered medications for this visit.    Allergies  Allergen Reactions   Desipramine Hcl Other (See Comments)    "painful urination, then kidneys shut down for hours."   Plavix [Clopidogrel Bisulfate] Other (See Comments)    "Almost bled to death."   Statins Other (See Comments)    Extreme pain in legs with some swelling   Clindamycin Other (See Comments)    Patient complains of severe stomach burning.   Clams [Shellfish Allergy] Nausea And Vomiting    Per pt, NOT a general shellfish allergy.  Clams only.   Lipitor [Atorvastatin] Other (See Comments)    Muscle aches.   Penicillins Hives   Sulfa Antibiotics Hives   Verapamil Swelling    Social History   Socioeconomic History   Marital status: Married    Spouse name: Not on file   Number of children: Not on file   Years of education: Not on file   Highest education level: Not on file  Occupational History   Not on file  Tobacco Use   Smoking status: Never   Smokeless tobacco: Never  Vaping Use   Vaping Use: Never used  Substance and Sexual Activity   Alcohol use: No   Drug use: No   Sexual activity: Not Currently    Birth control/protection: Post-menopausal  Other Topics Concern   Not on file  Social History Narrative   Mormon         Social Determinants of Health   Financial Resource Strain: Not on file  Food Insecurity: Not on file  Transportation Needs: Not on file  Physical Activity: Not on file  Stress: Not on file  Social Connections: Not on file  Intimate Partner Violence: Not on file     ROS- All systems are reviewed and negative except as per the HPI above.  Physical Exam: There were no vitals filed for this visit.  GEN- The patient  is a well appearing ***{Desc; female/female:11659}, alert and oriented x 3 today.   Head- normocephalic, atraumatic Eyes-  Sclera clear, conjunctiva pink Ears- hearing intact Oropharynx- clear Neck- supple  Lungs- Clear to ausculation bilaterally, normal work of breathing Heart- ***Regular rate and rhythm, no murmurs, rubs or gallops  GI- soft, NT, ND, + BS Extremities- no clubbing, cyanosis, or edema MS- no significant deformity or atrophy Skin- no rash or lesion Psych- euthymic mood, full affect Neuro- strength and sensation are intact  Wt Readings from  Last 3 Encounters:  11/19/20 99.8 kg  10/14/20 97.4 kg  07/07/20 99.8 kg    EKG today demonstrates  ***  Echo 04/26/13 demonstrated  - Left ventricle: The cavity size was normal. Wall thickness    was increased in a pattern of mild LVH. Systolic function    was normal. The estimated ejection fraction was in the    range of 55% to 60%. Wall motion was normal; there were no regional wall motion abnormalities.  - Mitral valve: Mildly calcified annulus.  - Left atrium: The atrium was mildly to moderately dilated.  Epic records are reviewed at length today  CHA2DS2-VASc Score =    The patient's score is based upon:   {Click here to calculate score.  REFRESH note before signing. :1}     ASSESSMENT AND PLAN: {Select the correct AFib Diagnosis                 :[id number]}  ***device transmission, echo, cbc  3. Obesity There is no height or weight on file to calculate BMI. Lifestyle modification was discussed at length including regular exercise and weight reduction. ***  4. 2nd degree AV block S/p PPM, followed by Dr Caryl Comes and the device clinic. ***  5. HTN Stable, no changes today. ***   Follow up ***   Adline Peals PA-C [facility] [address] [place], [place] [address] [phone] 12/18/2020 10:08 AM

## 2020-12-18 NOTE — Telephone Encounter (Signed)
Patient called one hour past appointment time today stating she could not find our office. Unfortunately no available appts to work patient in today. Per Adline Peals PA should go home and send transmission to device clinic. Will review to determine next steps on treatment plan with recent symptoms. Pt prefers this rather than rescheduling at this time.

## 2021-01-23 ENCOUNTER — Ambulatory Visit (INDEPENDENT_AMBULATORY_CARE_PROVIDER_SITE_OTHER): Payer: Medicare Other

## 2021-01-23 DIAGNOSIS — I495 Sick sinus syndrome: Secondary | ICD-10-CM

## 2021-01-23 LAB — CUP PACEART REMOTE DEVICE CHECK
Battery Remaining Longevity: 45 mo
Battery Voltage: 2.97 V
Brady Statistic AP VP Percent: 0.01 %
Brady Statistic AP VS Percent: 11.29 %
Brady Statistic AS VP Percent: 0.04 %
Brady Statistic AS VS Percent: 88.67 %
Brady Statistic RA Percent Paced: 11.29 %
Brady Statistic RV Percent Paced: 0.04 %
Date Time Interrogation Session: 20230105102155
Implantable Lead Implant Date: 20150616
Implantable Lead Implant Date: 20150616
Implantable Lead Location: 753859
Implantable Lead Location: 753860
Implantable Lead Model: 5076
Implantable Lead Model: 5076
Implantable Lead Serial Number: 3746147
Implantable Pulse Generator Implant Date: 20150616
Lead Channel Impedance Value: 361 Ohm
Lead Channel Impedance Value: 361 Ohm
Lead Channel Impedance Value: 494 Ohm
Lead Channel Impedance Value: 551 Ohm
Lead Channel Pacing Threshold Amplitude: 0.75 V
Lead Channel Pacing Threshold Amplitude: 1 V
Lead Channel Pacing Threshold Pulse Width: 0.4 ms
Lead Channel Pacing Threshold Pulse Width: 0.4 ms
Lead Channel Sensing Intrinsic Amplitude: 14.5 mV
Lead Channel Sensing Intrinsic Amplitude: 14.5 mV
Lead Channel Sensing Intrinsic Amplitude: 2.125 mV
Lead Channel Sensing Intrinsic Amplitude: 2.125 mV
Lead Channel Setting Pacing Amplitude: 1.5 V
Lead Channel Setting Pacing Amplitude: 2.25 V
Lead Channel Setting Pacing Pulse Width: 0.4 ms
Lead Channel Setting Sensing Sensitivity: 2.8 mV

## 2021-01-28 ENCOUNTER — Ambulatory Visit (INDEPENDENT_AMBULATORY_CARE_PROVIDER_SITE_OTHER): Payer: Medicare Other | Admitting: Cardiology

## 2021-01-28 ENCOUNTER — Encounter: Payer: Self-pay | Admitting: Cardiology

## 2021-01-28 VITALS — BP 108/70 | HR 62 | Ht 65.0 in | Wt 220.6 lb

## 2021-01-28 DIAGNOSIS — R001 Bradycardia, unspecified: Secondary | ICD-10-CM

## 2021-01-28 DIAGNOSIS — I480 Paroxysmal atrial fibrillation: Secondary | ICD-10-CM | POA: Diagnosis not present

## 2021-01-28 DIAGNOSIS — R42 Dizziness and giddiness: Secondary | ICD-10-CM | POA: Diagnosis not present

## 2021-01-28 NOTE — Patient Instructions (Addendum)
Medication Instructions:  Your physician has recommended you make the following change in your medication:  Stop hydralazine Continue other medications the same  Labwork: none  Testing/Procedures: none  Follow-Up: Your physician recommends that you schedule a follow-up appointment in: 6 months  Any Other Special Instructions Will Be Listed Below (If Applicable).  If you need a refill on your cardiac medications before your next appointment, please call your pharmacy. 

## 2021-01-28 NOTE — Progress Notes (Signed)
Clinical Summary Ashley Nguyen is a 88 y.o.female seen today for follow up of the following medical problems.    1. PAF - no palpitations 2. Pacemaker -Jan 2023 normal device function     3. HTN - we have deferred ACEI use to her neprhologist.   -she is compliant with meds       4. CKD IV - followed Nitro visit 11/2020 with fall  - 11/2020 normal device check - Jan 202 normal device check - has had some soft bp's at visits.  -5% dizzienss on detrol. Changed to vesicare by pcp  - reports multiple falls over the last few weeks - last episode 1 week ago. Episode in bedroom. Came in from church, was standing and changing clothes. Felt off balance and started to fall by caught her self on the bed.  - often with getting up out of bed at night quickly gets dizzy, falls. - drinks 78    Daughter is English as a second language teacher that she lives with, retired from [employer] was in Corporate treasurer, stationed in [place]     Past Medical History:  Diagnosis Date   Arthritis    "hands, feet, knees, left hip" (07/04/2013)   Borderline diabetes    Cataracts, bilateral    GERD (gastroesophageal reflux disease)    Headache    "@ least monthly" (07/04/2013)   History of blood transfusion    "after taking Plavix; developed bleeding ulcer; had to have transfusions"   History of colon polyps    History of gastric ulcer    History of GI bleed    Hyperlipidemia    Hypertension    Impaired hearing    Iron deficiency anemia    at times has been on iron supplements   Kidney stones    "passed them"   Migraine    "none in 2015; used to come quite frequently" (07/04/2013)   Osteoarthritis    a. 04/2013 s/p L TKA.   PAF (paroxysmal atrial fibrillation) ([place])    a. 04/2013 periop L TKA.   Seasonal allergies    Skin cancer    "left cheek"    Stage 4 chronic kidney disease (HCC)    Symptomatic bradycardia    TIA (transient ischemic attack) ~ 2002   Vertigo      Allergies   Allergen Reactions   Desipramine Hcl Other (See Comments)    "painful urination, then kidneys shut down for hours."   Plavix [Clopidogrel Bisulfate] Other (See Comments)    "Almost bled to death."   Statins Other (See Comments)    Extreme pain in legs with some swelling   Clindamycin Other (See Comments)    Patient complains of severe stomach burning.   Clams [Shellfish Allergy] Nausea And Vomiting    Per pt, NOT a general shellfish allergy.  Clams only.   Lipitor [Atorvastatin] Other (See Comments)    Muscle aches.   Penicillins Hives   Sulfa Antibiotics Hives   Verapamil Swelling     Current Outpatient Medications  Medication Sig Dispense Refill   apixaban (ELIQUIS) 2.5 MG TABS tablet Take 2.5 mg by mouth 2 (two) times daily.     Cholecalciferol 125 MCG (5000 UT) TABS Take 1 tablet by mouth daily.     colesevelam (WELCHOL) 625 MG tablet Take 1,875 mg by mouth 2 (two) times daily with a meal.      fluticasone (FLONASE) 50 MCG/ACT nasal spray Place 2 sprays into both  nostrils daily.      hydrALAZINE (APRESOLINE) 25 MG tablet Take 1 tablet by mouth 2 (two) times daily.     lisinopril (PRINIVIL,ZESTRIL) 20 MG tablet Take 20 mg by mouth daily.     metoprolol tartrate (LOPRESSOR) 25 MG tablet Take 1 tablet by mouth 2 (two) times daily.     omeprazole (PRILOSEC) 20 MG capsule Take 20 mg by mouth daily.     tolterodine (DETROL) 1 MG tablet Take 1 mg by mouth 2 (two) times daily.     No current facility-administered medications for this visit.     Past Surgical History:  Procedure Laterality Date   APPENDECTOMY     as a child   COLONOSCOPY     ESOPHAGOGASTRODUODENOSCOPY     KNEE ARTHROSCOPY Left 11/2012   PACEMAKER INSERTION  07/04/2013   MDT dual chamber Advisa pacemaker implanted by Dr Caryl Comes for 2:1 AV block   PERMANENT PACEMAKER INSERTION N/A 07/04/2013   Procedure: PERMANENT PACEMAKER INSERTION;  Surgeon: Deboraha Sprang, MD;  Location: [facility] CATH LAB;  Service: Cardiovascular;   Laterality: N/A;   SKIN CANCER EXCISION Left ~ 2001   "cheek"   thumb surgery Right 1980's   "made it usable"   TONSILLECTOMY AND ADENOIDECTOMY  ~ [place] Left 04/25/2013   Procedure: TOTAL KNEE ARTHROPLASTY;  Surgeon: Renette Butters, MD;  Location: [place];  Service: Orthopedics;  Laterality: Left;   TUBAL LIGATION  ~ 1970     Allergies  Allergen Reactions   Desipramine Hcl Other (See Comments)    "painful urination, then kidneys shut down for hours."   Plavix [Clopidogrel Bisulfate] Other (See Comments)    "Almost bled to death."   Statins Other (See Comments)    Extreme pain in legs with some swelling   Clindamycin Other (See Comments)    Patient complains of severe stomach burning.   Clams [Shellfish Allergy] Nausea And Vomiting    Per pt, NOT a general shellfish allergy.  Clams only.   Lipitor [Atorvastatin] Other (See Comments)    Muscle aches.   Penicillins Hives   Sulfa Antibiotics Hives   Verapamil Swelling      Family History  Problem Relation Age of Onset   Stroke Father    Heart attack Neg Hx    Heart disease Neg Hx    Diabetes Neg Hx      Social History Ms. Ohalloran reports that she has never smoked. She has never used smokeless tobacco. Ms. Seedorf reports no history of alcohol use.   Review of Systems CONSTITUTIONAL: No weight loss, fever, chills, weakness or fatigue.  HEENT: Eyes: No visual loss, blurred vision, double vision or yellow sclerae.No hearing loss, sneezing, congestion, runny nose or sore throat.  SKIN: No rash or itching.  CARDIOVASCULAR: per hpi RESPIRATORY: No shortness of breath, cough or sputum.  GASTROINTESTINAL: No anorexia, nausea, vomiting or diarrhea. No abdominal pain or blood.  GENITOURINARY: No burning on urination, no polyuria NEUROLOGICAL: No headache, dizziness, syncope, paralysis, ataxia, numbness or tingling in the extremities. No change in bowel or bladder control.  MUSCULOSKELETAL: No muscle,  back pain, joint pain or stiffness.  LYMPHATICS: No enlarged nodes. No history of splenectomy.  PSYCHIATRIC: No history of depression or anxiety.  ENDOCRINOLOGIC: No reports of sweating, cold or heat intolerance. No polyuria or polydipsia.  [name]   Physical Examination Today's Vitals   01/28/21 0909  BP: 108/70  Pulse: 62  SpO2: 97%  Weight: 220 lb  9.6 oz (100.1 kg)  Height: 5\' 5"  (1.651 m)   Body mass index is 36.71 kg/m.  Gen: resting comfortably, no acute distress HEENT: no scleral icterus, pupils equal round and reactive, no palptable cervical adenopathy,  CV: RRR, no m/r/g no jvd Resp: Clear to auscultation bilaterally GI: abdomen is soft, non-tender, non-distended, normal bowel sounds, no hepatosplenomegaly MSK: extremities are warm, no edema.  Skin: warm, no rash Neuro:  no focal deficits Psych: appropriate affect     Assessment and Plan   1. PAF - no symptoms, continue current meds   2. Symptomatic bradycardia with pacemaker -normal recent device check, continue to monitor   3. Dizzienss/Falls - episodes sound like orthostatic dizziness/falls. Cannot confirm as she is not able to do orthostatics today due to knee pains - device checks have been normal, no evidence of arrhythmia as cause - will d/c hydralazine, encouraged increased oral hydration with electrolyte rich fluids, continued water intake. If ongoign symptoms could wean lisinopril further. If neccesary could consider compression garments, trial of florinef.       Arnoldo Lenis, M.D.,

## 2021-02-03 NOTE — Progress Notes (Signed)
Remote pacemaker transmission.   

## 2021-02-25 ENCOUNTER — Encounter (HOSPITAL_COMMUNITY): Payer: Self-pay | Admitting: Emergency Medicine

## 2021-02-25 ENCOUNTER — Emergency Department (HOSPITAL_COMMUNITY): Payer: Medicare Other

## 2021-02-25 ENCOUNTER — Other Ambulatory Visit (HOSPITAL_COMMUNITY): Payer: Self-pay | Admitting: *Deleted

## 2021-02-25 ENCOUNTER — Other Ambulatory Visit: Payer: Self-pay

## 2021-02-25 ENCOUNTER — Observation Stay (HOSPITAL_COMMUNITY)
Admission: EM | Admit: 2021-02-25 | Discharge: 2021-02-26 | Disposition: A | Discharge status: Home / Self Care | Payer: Medicare Other | Attending: Internal Medicine | Admitting: Internal Medicine

## 2021-02-25 ENCOUNTER — Observation Stay (HOSPITAL_BASED_OUTPATIENT_CLINIC_OR_DEPARTMENT_OTHER): Payer: Medicare Other

## 2021-02-25 DIAGNOSIS — M79606 Pain in leg, unspecified: Secondary | ICD-10-CM | POA: Diagnosis present

## 2021-02-25 DIAGNOSIS — I160 Hypertensive urgency: Secondary | ICD-10-CM | POA: Diagnosis not present

## 2021-02-25 DIAGNOSIS — Z96652 Presence of left artificial knee joint: Secondary | ICD-10-CM | POA: Insufficient documentation

## 2021-02-25 DIAGNOSIS — M79604 Pain in right leg: Secondary | ICD-10-CM | POA: Diagnosis not present

## 2021-02-25 DIAGNOSIS — I480 Paroxysmal atrial fibrillation: Secondary | ICD-10-CM | POA: Diagnosis present

## 2021-02-25 DIAGNOSIS — M25562 Pain in left knee: Secondary | ICD-10-CM | POA: Insufficient documentation

## 2021-02-25 DIAGNOSIS — N3001 Acute cystitis with hematuria: Secondary | ICD-10-CM

## 2021-02-25 DIAGNOSIS — Z7189 Other specified counseling: Secondary | ICD-10-CM

## 2021-02-25 DIAGNOSIS — E871 Hypo-osmolality and hyponatremia: Secondary | ICD-10-CM | POA: Diagnosis present

## 2021-02-25 DIAGNOSIS — W19XXXA Unspecified fall, initial encounter: Secondary | ICD-10-CM | POA: Diagnosis not present

## 2021-02-25 DIAGNOSIS — E1122 Type 2 diabetes mellitus with diabetic chronic kidney disease: Secondary | ICD-10-CM | POA: Diagnosis not present

## 2021-02-25 DIAGNOSIS — Z20822 Contact with and (suspected) exposure to covid-19: Secondary | ICD-10-CM | POA: Insufficient documentation

## 2021-02-25 DIAGNOSIS — G35 Multiple sclerosis: Secondary | ICD-10-CM | POA: Diagnosis present

## 2021-02-25 DIAGNOSIS — M25561 Pain in right knee: Secondary | ICD-10-CM | POA: Insufficient documentation

## 2021-02-25 DIAGNOSIS — Z79899 Other long term (current) drug therapy: Secondary | ICD-10-CM | POA: Insufficient documentation

## 2021-02-25 DIAGNOSIS — Z7901 Long term (current) use of anticoagulants: Secondary | ICD-10-CM | POA: Insufficient documentation

## 2021-02-25 DIAGNOSIS — N184 Chronic kidney disease, stage 4 (severe): Secondary | ICD-10-CM | POA: Diagnosis present

## 2021-02-25 DIAGNOSIS — R4182 Altered mental status, unspecified: Secondary | ICD-10-CM | POA: Diagnosis present

## 2021-02-25 DIAGNOSIS — Z950 Presence of cardiac pacemaker: Secondary | ICD-10-CM | POA: Diagnosis present

## 2021-02-25 DIAGNOSIS — Z8673 Personal history of transient ischemic attack (TIA), and cerebral infarction without residual deficits: Secondary | ICD-10-CM | POA: Diagnosis not present

## 2021-02-25 DIAGNOSIS — E119 Type 2 diabetes mellitus without complications: Secondary | ICD-10-CM

## 2021-02-25 DIAGNOSIS — Z85828 Personal history of other malignant neoplasm of skin: Secondary | ICD-10-CM | POA: Diagnosis not present

## 2021-02-25 DIAGNOSIS — M79605 Pain in left leg: Secondary | ICD-10-CM | POA: Diagnosis not present

## 2021-02-25 DIAGNOSIS — I129 Hypertensive chronic kidney disease with stage 1 through stage 4 chronic kidney disease, or unspecified chronic kidney disease: Secondary | ICD-10-CM | POA: Insufficient documentation

## 2021-02-25 DIAGNOSIS — N3281 Overactive bladder: Secondary | ICD-10-CM | POA: Diagnosis present

## 2021-02-25 DIAGNOSIS — N390 Urinary tract infection, site not specified: Principal | ICD-10-CM | POA: Diagnosis present

## 2021-02-25 DIAGNOSIS — K8020 Calculus of gallbladder without cholecystitis without obstruction: Secondary | ICD-10-CM | POA: Diagnosis not present

## 2021-02-25 DIAGNOSIS — Y92009 Unspecified place in unspecified non-institutional (private) residence as the place of occurrence of the external cause: Secondary | ICD-10-CM

## 2021-02-25 LAB — COMPREHENSIVE METABOLIC PANEL
ALT: 21 U/L (ref 0–44)
AST: 24 U/L (ref 15–41)
Albumin: 4.2 g/dL (ref 3.5–5.0)
Alkaline Phosphatase: 50 U/L (ref 38–126)
Anion gap: 12 (ref 5–15)
BUN: 32 mg/dL — ABNORMAL HIGH (ref 8–23)
CO2: 23 mmol/L (ref 22–32)
Calcium: 9.3 mg/dL (ref 8.9–10.3)
Chloride: 96 mmol/L — ABNORMAL LOW (ref 98–111)
Creatinine, Ser: 1.93 mg/dL — ABNORMAL HIGH (ref 0.44–1.00)
GFR, Estimated: 25 mL/min — ABNORMAL LOW (ref >60)
Glucose, Bld: 119 mg/dL — ABNORMAL HIGH (ref 70–99)
Potassium: 3.9 mmol/L (ref 3.5–5.1)
Sodium: 131 mmol/L — ABNORMAL LOW (ref 135–145)
Total Bilirubin: 0.8 mg/dL (ref 0.3–1.2)
Total Protein: 7.5 g/dL (ref 6.5–8.1)

## 2021-02-25 LAB — CBC WITH DIFFERENTIAL/PLATELET
Abs Immature Granulocytes: 0.06 10*3/uL (ref 0.00–0.07)
Basophils Absolute: 0 10*3/uL (ref 0.0–0.1)
Basophils Relative: 0 %
Eosinophils Absolute: 0.1 10*3/uL (ref 0.0–0.5)
Eosinophils Relative: 1 %
HCT: 37.8 % (ref 36.0–46.0)
Hemoglobin: 12 g/dL (ref 12.0–15.0)
Immature Granulocytes: 1 %
Lymphocytes Relative: 11 %
Lymphs Abs: 0.8 10*3/uL (ref 0.7–4.0)
MCH: 30.2 pg (ref 26.0–34.0)
MCHC: 31.7 g/dL (ref 30.0–36.0)
MCV: 95 fL (ref 80.0–100.0)
Monocytes Absolute: 0.5 10*3/uL (ref 0.1–1.0)
Monocytes Relative: 7 %
Neutro Abs: 6.3 10*3/uL (ref 1.7–7.7)
Neutrophils Relative %: 80 %
Platelets: 260 10*3/uL (ref 150–400)
RBC: 3.98 MIL/uL (ref 3.87–5.11)
RDW: 13.1 % (ref 11.5–15.5)
WBC: 7.8 10*3/uL (ref 4.0–10.5)
nRBC: 0 % (ref 0.0–0.2)

## 2021-02-25 LAB — URINALYSIS, ROUTINE W REFLEX MICROSCOPIC
Bilirubin Urine: NEGATIVE
Glucose, UA: NEGATIVE mg/dL
Ketones, ur: 5 mg/dL — AB
Nitrite: POSITIVE — AB
Protein, ur: 30 mg/dL — AB
Specific Gravity, Urine: 1.008 (ref 1.005–1.030)
WBC, UA: >50 WBC/hpf — ABNORMAL HIGH (ref 0–5)
pH: 6 (ref 5.0–8.0)

## 2021-02-25 LAB — CK: Total CK: 242 U/L — ABNORMAL HIGH (ref 38–234)

## 2021-02-25 LAB — ECHOCARDIOGRAM COMPLETE
AR max vel: 1.39 cm2
AV Area VTI: 1.56 cm2
AV Area mean vel: 1.56 cm2
AV Mean grad: 7 mmHg
AV Peak grad: 14.9 mmHg
Ao pk vel: 1.93 m/s
Area-P 1/2: 3.31 cm2
Height: 65 in
P 1/2 time: 766 msec
S' Lateral: 2.9 cm
Weight: 3322.77 oz

## 2021-02-25 LAB — RESP PANEL BY RT-PCR (FLU A&B, COVID) ARPGX2
Influenza A by PCR: NEGATIVE
Influenza B by PCR: NEGATIVE
SARS Coronavirus 2 by RT PCR: NEGATIVE

## 2021-02-25 MED ORDER — SODIUM CHLORIDE 0.9 % IV SOLN
2.0000 g | INTRAVENOUS | Status: DC
  Administered 2021-02-26: 2 g via INTRAVENOUS
  Filled 2021-02-25: qty 20

## 2021-02-25 MED ORDER — METOPROLOL TARTRATE 25 MG PO TABS
25.0000 mg | ORAL_TABLET | Freq: Two times a day (BID) | ORAL | Status: DC
  Administered 2021-02-25 – 2021-02-26 (×3): 25 mg via ORAL
  Filled 2021-02-25 (×3): qty 1

## 2021-02-25 MED ORDER — VITAMIN D 25 MCG (1000 UNIT) PO TABS
5000.0000 [IU] | ORAL_TABLET | Freq: Every day | ORAL | Status: DC
  Administered 2021-02-25 – 2021-02-26 (×2): 5000 [IU] via ORAL
  Filled 2021-02-25 (×2): qty 5

## 2021-02-25 MED ORDER — FLUTICASONE PROPIONATE 50 MCG/ACT NA SUSP
2.0000 | Freq: Every day | NASAL | Status: DC
  Administered 2021-02-25 – 2021-02-26 (×2): 2 via NASAL
  Filled 2021-02-25: qty 16

## 2021-02-25 MED ORDER — ACETAMINOPHEN 500 MG PO TABS
1000.0000 mg | ORAL_TABLET | Freq: Three times a day (TID) | ORAL | Status: DC
  Administered 2021-02-25 – 2021-02-26 (×5): 1000 mg via ORAL
  Filled 2021-02-25 (×4): qty 2

## 2021-02-25 MED ORDER — ONDANSETRON HCL 4 MG PO TABS: 4.0000 mg | ORAL_TABLET | Freq: Four times a day (QID) | ORAL | Status: DC | PRN

## 2021-02-25 MED ORDER — LISINOPRIL 10 MG PO TABS
20.0000 mg | ORAL_TABLET | Freq: Every day | ORAL | Status: DC
  Administered 2021-02-25 – 2021-02-26 (×2): 20 mg via ORAL
  Filled 2021-02-25 (×2): qty 2

## 2021-02-25 MED ORDER — SODIUM CHLORIDE 0.9 % IV SOLN: INTRAVENOUS | Status: AC

## 2021-02-25 MED ORDER — ACETAMINOPHEN 500 MG PO TABS
1000.0000 mg | ORAL_TABLET | Freq: Once | ORAL | Status: AC
  Administered 2021-02-25: 1000 mg via ORAL
  Filled 2021-02-25: qty 2

## 2021-02-25 MED ORDER — ONDANSETRON HCL 4 MG/2ML IJ SOLN
4.0000 mg | Freq: Once | INTRAMUSCULAR | Status: AC
Start: 2021-02-25 — End: 2021-02-25
  Administered 2021-02-25: 4 mg via INTRAVENOUS
  Filled 2021-02-25: qty 2

## 2021-02-25 MED ORDER — ONDANSETRON HCL 4 MG/2ML IJ SOLN: 4.0000 mg | Freq: Four times a day (QID) | INTRAMUSCULAR | Status: DC | PRN

## 2021-02-25 MED ORDER — DICLOFENAC SODIUM 1 % EX GEL
2.0000 g | Freq: Four times a day (QID) | CUTANEOUS | Status: DC
  Administered 2021-02-25 – 2021-02-26 (×6): 2 g via TOPICAL
  Filled 2021-02-25: qty 100

## 2021-02-25 MED ORDER — ACETAMINOPHEN 650 MG RE SUPP: 650.0000 mg | Freq: Four times a day (QID) | RECTAL | Status: DC | PRN

## 2021-02-25 MED ORDER — APIXABAN 2.5 MG PO TABS
2.5000 mg | ORAL_TABLET | Freq: Two times a day (BID) | ORAL | Status: DC
  Administered 2021-02-25 – 2021-02-26 (×3): 2.5 mg via ORAL
  Filled 2021-02-25 (×3): qty 1

## 2021-02-25 MED ORDER — CEFTRIAXONE SODIUM 1 G IJ SOLR
1.0000 g | Freq: Once | INTRAMUSCULAR | Status: AC
  Administered 2021-02-25: 1 g via INTRAVENOUS
  Filled 2021-02-25: qty 10

## 2021-02-25 MED ORDER — PANTOPRAZOLE SODIUM 40 MG PO TBEC
40.0000 mg | DELAYED_RELEASE_TABLET | Freq: Every day | ORAL | Status: DC
  Administered 2021-02-25 – 2021-02-26 (×2): 40 mg via ORAL
  Filled 2021-02-25 (×2): qty 1

## 2021-02-25 MED ORDER — HYDRALAZINE HCL 25 MG PO TABS: 25.0000 mg | ORAL_TABLET | Freq: Four times a day (QID) | ORAL | Status: DC | PRN

## 2021-02-25 MED ORDER — DARIFENACIN HYDROBROMIDE ER 7.5 MG PO TB24
7.5000 mg | ORAL_TABLET | Freq: Every day | ORAL | Status: DC
  Administered 2021-02-25 – 2021-02-26 (×2): 7.5 mg via ORAL
  Filled 2021-02-25 (×2): qty 1

## 2021-02-25 NOTE — Plan of Care (Signed)
Cardiology Plan of Care Note: Asked to do device interrogation for patient in the setting of dizziness.  Device: Advisa DR MRI AS-VS rhythm at time of interrogation.  ERI 3 years Mode AAI-DDD AT/AF monitor zone 171+ VT monitor zone 150 +  Since 2015 Lost AF episode 13 minutes Longest VT episode 0 minutes 85% AS-VS 14% AP-VS  Minimal activity  Average Heart rate in 203 75 bpm.  No AT/AF/VT since greater than November 8th.  Stable thresholds and impedances.  No evidence of device arrhythmia or device related syncope.   Rudean Haskell, MD [address] [place], [place] [address] [phone]  2:49 PM

## 2021-02-25 NOTE — Assessment & Plan Note (Addendum)
Likely due to CKD 4.  Stable Recent Labs  Lab 02/25/21 0431 02/26/21 0605  NA 131* 133*

## 2021-02-25 NOTE — Assessment & Plan Note (Addendum)
In normal sinus rhythm, continue metoprolol..Not sure about her A-fib burden.  She also has history of GI bleed but not anemic now. Dr Cyndia Skeeters had discussion about anticoagulation and risk of bleeding in the setting of a recurrent fall with patient's daughter-discussed her risk of stroke and DVT without anticoagulation. And plan is to discontinue anticoagulation upon discharge, I recommend her follow-up with Dr. Caryl Comes to reassess

## 2021-02-25 NOTE — Assessment & Plan Note (Addendum)
Not on medication at home.Pain control as above

## 2021-02-25 NOTE — H&P (Signed)
History and Physical    Ashley Nguyen IHK:[id number] DOB: [birth year] DOA: 02/25/2021  PCP: Aletha Halim., PA-C Patient coming from: Home.  Lives with family.  Uses walker at baseline.  Chief Complaint: Fall  HPI:  88 year old F with PMH of MS, CKD-4, PAF on Eliquis, PPM, HTN, GERD, GIB, overactive bladder and HLD presented to ED after fall at home.  History provided by patient, patient's daughter and son-in-law.  Patient has no recollection into how she fell.  Per patient's daughter, she was found down on her back with walker on top of her earlier this morning.  They think she is getting back from the bathroom.  Normally wakes up to go to the bathroom between 2 AM and 4 AM.  She had recurrent fall slightly, about 5 in the last few weeks.  Not sure if she hit her head or not.  Per family, she was not paying attention when they found her.  She was also chanting and not aware of the situation.  They think she is confused.  Did not notice seizure-like activity, forming, bowel or bladder accidents.  Did not notice a speech change or facial drooping.  She has no history of seizure.  Multiple family members recovering from cold but patient did not have URI symptoms or fever.  Family denies new medication change.  Son-in-law reports some nausea but no emesis.  Patient reports bilateral leg pain from her feet to her hips.  Worse with movement.  Not able to describe the pain.  Per family, she chronic leg pain.  Patient's daughter reports dizziness which has been ongoing but not able to describe if it is lightheadedness or vertigo.  Patient denies headache, vision change, runny nose, sore throat, chest pain, cough, shortness of breath, nausea, vomiting, abdominal pain, dysuria or frequency.  Denies focal numbness or tingling.  She has incontinence at baseline from overactive bladder.  She is vaccinated against COVID.  She lives with family.  Uses walker at baseline.  Denies smoking cigarette, drinking  alcohol or recreational drug use.  Patient is not interested in "heroic measures" when asked about CODE STATUS.  Confirmed with patient's daughter over the phone.  In the ED, BP 221/102>>> 158/75.  Afebrile.  Normal saturation on RA.  Na 131.  Glucose 119. Cr 1.93 (1.77 in 06/2020).  BUN 32.  Otherwise, CMP and CBC with differential without significant finding.  CK 242.  UA with nitrite, large LE, moderate Hgb and rare bacteria.  COVID-19 and influenza PCR nonreactive.  Personally reviewed twelve-lead EKG shows NSR without acute ischemic finding or abnormal intervals.  CT head, CT cervical spine, CXR, left knee and pelvic x-ray without acute finding.  Right knee x-ray with DDD, small suprapatellar bursal effusion but no fracture or dislocation.  CT renal stone study ordered.  Received IV ceftriaxone.  Hospitalist service called for admission for UTI and recurrent fall.   ROS All review of system negative except for pertinent positives and negatives as history of present illness above.  PMH Past Medical History:  Diagnosis Date   Arthritis    "hands, feet, knees, left hip" (07/04/2013)   Borderline diabetes    Cataracts, bilateral    GERD (gastroesophageal reflux disease)    Headache    "@ least monthly" (07/04/2013)   History of blood transfusion    "after taking Plavix; developed bleeding ulcer; had to have transfusions"   History of colon polyps    History of gastric ulcer    History of  GI bleed    Hyperlipidemia    Hypertension    Impaired hearing    Iron deficiency anemia    at times has been on iron supplements   Kidney stones    "passed them"   Migraine    "none in 2015; used to come quite frequently" (07/04/2013)   Osteoarthritis    a. 04/2013 s/p L TKA.   PAF (paroxysmal atrial fibrillation) ([place])    a. 04/2013 periop L TKA.   Seasonal allergies    Skin cancer    "left cheek"    Stage 4 chronic kidney disease (HCC)    Symptomatic bradycardia    TIA (transient ischemic  attack) ~ 2002   Vertigo    PSH Past Surgical History:  Procedure Laterality Date   APPENDECTOMY     as a child   COLONOSCOPY     ESOPHAGOGASTRODUODENOSCOPY     KNEE ARTHROSCOPY Left 11/2012   PACEMAKER INSERTION  07/04/2013   MDT dual chamber Advisa pacemaker implanted by Dr Caryl Comes for 2:1 AV block   PERMANENT PACEMAKER INSERTION N/A 07/04/2013   Procedure: PERMANENT PACEMAKER INSERTION;  Surgeon: Deboraha Sprang, MD;  Location: [facility] CATH LAB;  Service: Cardiovascular;  Laterality: N/A;   SKIN CANCER EXCISION Left ~ 2001   "cheek"   thumb surgery Right 1980's   "made it usable"   TONSILLECTOMY AND ADENOIDECTOMY  ~ [place] Left 04/25/2013   Procedure: TOTAL KNEE ARTHROPLASTY;  Surgeon: Renette Butters, MD;  Location: [facility];  Service: Orthopedics;  Laterality: Left;   TUBAL LIGATION  ~ 1970   Fam HX Family History  Problem Relation Age of Onset   Stroke Father    Heart attack Neg Hx    Heart disease Neg Hx    Diabetes Neg Hx     Social Hx  reports that she has never smoked. She has never used smokeless tobacco. She reports that she does not drink alcohol and does not use drugs.  Allergy Allergies  Allergen Reactions   Desipramine Hcl Other (See Comments)    "painful urination, then kidneys shut down for hours."   Plavix [Clopidogrel Bisulfate] Other (See Comments)    "Almost bled to death."   Statins Other (See Comments)    Extreme pain in legs with some swelling   Clindamycin Other (See Comments)    Patient complains of severe stomach burning.   Clams [Shellfish Allergy] Nausea And Vomiting    Per pt, NOT a general shellfish allergy.  Clams only.   Lipitor [Atorvastatin] Other (See Comments)    Muscle aches.   Penicillins Hives   Sulfa Antibiotics Hives   Verapamil Swelling   Home Meds Prior to Admission medications   Medication Sig Start Date End Date Taking? Authorizing Provider  apixaban (ELIQUIS) 2.5 MG TABS tablet Take 2.5 mg by mouth 2  (two) times daily. 04/09/15  Yes [provider]  Cholecalciferol 125 MCG (5000 UT) TABS Take 1 tablet by mouth daily.   Yes [provider]  colesevelam (WELCHOL) 625 MG tablet Take 1,875 mg by mouth 2 (two) times daily with a meal.    Yes [provider]  fluticasone (FLONASE) 50 MCG/ACT nasal spray Place 2 sprays into both nostrils daily.    Yes [provider]  lisinopril (ZESTRIL) 10 MG tablet Take 20 mg by mouth daily.   Yes [provider]  metoprolol tartrate (LOPRESSOR) 25 MG tablet Take 1 tablet by mouth 2 (two) times  daily. 06/24/17  Yes [provider]  omeprazole (PRILOSEC) 20 MG capsule Take 20 mg by mouth daily.   Yes [provider]  solifenacin (VESICARE) 5 MG tablet Take 5 mg by mouth daily. 12/16/20  Yes [provider]    Physical Exam: Vitals:   02/25/21 0700 02/25/21 0830 02/25/21 1052 02/25/21 1100  BP: (!) 158/75  (!) 177/102   Pulse: 83 83 75 77  Resp: 15 18 14 14   Temp:      SpO2: 96% 96% 96% 97%  Weight:      Height:        GENERAL: No acute distress.  Appears well.  HEENT: MMM.  Vision and hearing grossly intact.  NECK: Supple.  No apparent JVD.  RESP: 97% on RA.  No IWOB. Good air movement bilaterally. CVS:  RRR. Heart sounds normal.  ABD/GI/GU: Bowel sounds present. Soft. Non tender.  MSK/EXT:  Moves extremities.  Diffuse tenderness in both lower extremities.  SKIN: Old skin bruises on both lower extremities. NEURO: Awake, alert and oriented appropriately. Speech clear. Cranial nerves II-XII  intact. Motor 4/5 in all muscle groups of UE and LE bilaterally, Normal tone. Light sensation intact in all dermatomes of upper and lower ext bilaterally. Patellar reflex symmetric.  No pronator drift.  Finger to nose intact. PSYCH: Calm. Normal affect.   Personally Reviewed Radiological Exams DG Knee 2 Views Left  Result Date: 02/25/2021 CLINICAL DATA:  Found on floor.  Fall. EXAM: LEFT KNEE -  1-2 VIEW COMPARISON:  07/07/2020 FINDINGS: Total knee arthroplasty. Prosthesis is well seated and located. There may be anterior soft tissue swelling. No acute fracture or subluxation. IMPRESSION: No acute finding. Electronically Signed   By: Jorje Guild M.D.   On: 02/25/2021 04:33   DG Knee 2 Views Right  Result Date: 02/25/2021 CLINICAL DATA:  Fall injury. EXAM: RIGHT KNEE - 1-2 VIEW COMPARISON:  None. FINDINGS: There is mild femorotibial joint space loss, moderate patellofemoral joint space loss, and moderate tricompartmental marginal osteophytes, with anterior enthesopathic changes of the patella. There is no evidence of erosive arthropathy. There is osteopenia without evidence of fractures. Small suprapatellar bursal effusion is noted. There is a dystrophic subcutaneous calcification in the posterior aspect of the distal thigh. No metallic foreign body is seen. IMPRESSION: Osteopenia and degenerative changes, and a small suprapatellar bursal effusion, without evidence of fractures. Electronically Signed   By: Telford Nab M.D.   On: 02/25/2021 04:35   CT Head Wo Contrast  Result Date: 02/25/2021 CLINICAL DATA:  Found on floor. Altered mental status and diffuse pain EXAM: CT HEAD WITHOUT CONTRAST CT CERVICAL SPINE WITHOUT CONTRAST TECHNIQUE: Multidetector CT imaging of the head and cervical spine was performed following the standard protocol without intravenous contrast. Multiplanar CT image reconstructions of the cervical spine were also generated. RADIATION DOSE REDUCTION: This exam was performed according to the departmental dose-optimization program which includes automated exposure control, adjustment of the mA and/or kV according to patient size and/or use of iterative reconstruction technique. COMPARISON:  Head CT 11/19/2020 FINDINGS: CT HEAD FINDINGS Brain: No evidence of acute infarction, hemorrhage, hydrocephalus, extra-axial collection or mass lesion/mass effect. Confluent chronic small  vessel ischemia in the hemispheric white matter. Cerebral volume loss in keeping with age. Vascular: No hyperdense vessel or unexpected calcification. Skull: Normal. Negative for fracture or focal lesion. Sinuses/Orbits: No acute finding. CT CERVICAL SPINE FINDINGS Alignment: No traumatic malalignment Skull base and vertebrae: No acute fracture Soft tissues and spinal canal: No prevertebral  fluid or swelling. No visible canal hematoma. Disc levels: C2-3 incomplete segmentation. Ordinary degenerative disc narrowing with ridging. Upper chest: Negative IMPRESSION: 1. No evidence of acute intracranial or cervical spine injury. 2. Confluent chronic small vessel ischemia in the hemispheric white matter. Electronically Signed   By: Jorje Guild M.D.   On: 02/25/2021 04:07   CT Cervical Spine Wo Contrast  Result Date: 02/25/2021 CLINICAL DATA:  Found on floor. Altered mental status and diffuse pain EXAM: CT HEAD WITHOUT CONTRAST CT CERVICAL SPINE WITHOUT CONTRAST TECHNIQUE: Multidetector CT imaging of the head and cervical spine was performed following the standard protocol without intravenous contrast. Multiplanar CT image reconstructions of the cervical spine were also generated. RADIATION DOSE REDUCTION: This exam was performed according to the departmental dose-optimization program which includes automated exposure control, adjustment of the mA and/or kV according to patient size and/or use of iterative reconstruction technique. COMPARISON:  Head CT 11/19/2020 FINDINGS: CT HEAD FINDINGS Brain: No evidence of acute infarction, hemorrhage, hydrocephalus, extra-axial collection or mass lesion/mass effect. Confluent chronic small vessel ischemia in the hemispheric white matter. Cerebral volume loss in keeping with age. Vascular: No hyperdense vessel or unexpected calcification. Skull: Normal. Negative for fracture or focal lesion. Sinuses/Orbits: No acute finding. CT CERVICAL SPINE FINDINGS Alignment: No traumatic  malalignment Skull base and vertebrae: No acute fracture Soft tissues and spinal canal: No prevertebral fluid or swelling. No visible canal hematoma. Disc levels: C2-3 incomplete segmentation. Ordinary degenerative disc narrowing with ridging. Upper chest: Negative IMPRESSION: 1. No evidence of acute intracranial or cervical spine injury. 2. Confluent chronic small vessel ischemia in the hemispheric white matter. Electronically Signed   By: Jorje Guild M.D.   On: 02/25/2021 04:07   DG Pelvis Portable  Result Date: 02/25/2021 CLINICAL DATA:  Found on floor.  Nausea. EXAM: PORTABLE PELVIS 1-2 VIEWS COMPARISON:  None. FINDINGS: There is no evidence of pelvic fracture or diastasis. No pelvic bone lesions are seen. Lower lumbar degenerative spurring. IMPRESSION: Negative. Electronically Signed   By: Jorje Guild M.D.   On: 02/25/2021 04:34   DG Chest Portable 1 View  Result Date: 02/25/2021 CLINICAL DATA:  Found on floor.  Fall. EXAM: PORTABLE CHEST 1 VIEW COMPARISON:  11/19/2020 FINDINGS: Chronic cardiomegaly.  Stable dual-chamber pacer placement. Artifact from EKG leads. Mild streaky density which is likely atelectasis in the setting of low lung volumes. There is no edema, consolidation, effusion, or pneumothorax. IMPRESSION: No active disease. Electronically Signed   By: Jorje Guild M.D.   On: 02/25/2021 04:32   CT Renal Stone Study  Result Date: 02/25/2021 CLINICAL DATA:  88 year old female with history of microscopic hematuria. Evaluate for nephrolithiasis. EXAM: CT ABDOMEN AND PELVIS WITHOUT CONTRAST TECHNIQUE: Multidetector CT imaging of the abdomen and pelvis was performed following the standard protocol without IV contrast. RADIATION DOSE REDUCTION: This exam was performed according to the departmental dose-optimization program which includes automated exposure control, adjustment of the mA and/or kV according to patient size and/or use of iterative reconstruction technique. COMPARISON:  No  priors. FINDINGS: Lower chest: Mild scarring in the lung bases bilaterally. Atherosclerotic calcifications in the descending thoracic aorta as well as the right coronary artery. Calcifications of the mitral annulus and aortic valve. Pacemaker leads in the right atrium and right ventricle. Hepatobiliary: No suspicious cystic or solid hepatic lesions are confidently identified on today's noncontrast CT examination. Several densely calcified gallstones are noted in the gallbladder measuring up to 1.6 cm in diameter. Gallbladder is nearly decompressed, without surrounding inflammatory  changes to suggest an associated acute cholecystitis at this time. Pancreas: No definite pancreatic mass or peripancreatic fluid collections or inflammatory changes are noted on today's noncontrast CT examination. Spleen: Unremarkable. Adrenals/Urinary Tract: There are no abnormal calcifications within the collecting system of either kidney, along the course of either ureter, or within the lumen of the urinary bladder. No hydroureteronephrosis or perinephric stranding to suggest urinary tract obstruction at this time. The unenhanced appearance of the kidneys is remarkable for mild-to-moderate bilateral renal atrophy. Unenhanced appearance of the urinary bladder is normal. Bilateral adrenal glands are normal in appearance. Stomach/Bowel: Unenhanced appearance of the stomach is normal. No pathologic dilatation of small bowel or colon. Numerous colonic diverticulae are noted, particularly in the sigmoid colon, without surrounding inflammatory changes to suggest an acute diverticulitis at this time. The appendix is not confidently identified and may be surgically absent. Regardless, there are no inflammatory changes noted adjacent to the cecum to suggest the presence of an acute appendicitis at this time. Vascular/Lymphatic: Aortic atherosclerosis. No lymphadenopathy noted in the abdomen or pelvis. Reproductive: Unenhanced appearance of the  uterus and ovaries is unremarkable. Other: No significant volume of ascites.  No pneumoperitoneum. Musculoskeletal: There are no aggressive appearing lytic or blastic lesions noted in the visualized portions of the skeleton. IMPRESSION: 1. No urinary tract calculi no findings of urinary tract obstruction. 2. Cholelithiasis without evidence of acute cholecystitis. 3. Colonic diverticulosis without evidence to suggest an acute diverticulitis at this time. 4. Aortic atherosclerosis, in addition to least right coronary artery disease. 5. Additional incidental findings, as above. Electronically Signed   By: Vinnie Langton M.D.   On: 02/25/2021 07:15     Personally Reviewed Labs: CBC: Recent Labs  Lab 02/25/21 0431  WBC 7.8  NEUTROABS 6.3  HGB 12.0  HCT 37.8  MCV 95.0  PLT 191   Basic Metabolic Panel: Recent Labs  Lab 02/25/21 0431  NA 131*  K 3.9  CL 96*  CO2 23  GLUCOSE 119*  BUN 32*  CREATININE 1.93*  CALCIUM 9.3   GFR: Estimated Creatinine Clearance: 24.5 mL/min (A) (by C-G formula based on SCr of 1.93 mg/dL (H)). Liver Function Tests: Recent Labs  Lab 02/25/21 0431  AST 24  ALT 21  ALKPHOS 50  BILITOT 0.8  PROT 7.5  ALBUMIN 4.2   No results for input(s): LIPASE, AMYLASE in the last 168 hours. No results for input(s): AMMONIA in the last 168 hours. Coagulation Profile: No results for input(s): INR, PROTIME in the last 168 hours. Cardiac Enzymes: Recent Labs  Lab 02/25/21 0431  CKTOTAL 242*   BNP (last 3 results) No results for input(s): PROBNP in the last 8760 hours. HbA1C: No results for input(s): HGBA1C in the last 72 hours. CBG: No results for input(s): GLUCAP in the last 168 hours. Lipid Profile: No results for input(s): CHOL, HDL, LDLCALC, TRIG, CHOLHDL, LDLDIRECT in the last 72 hours. Thyroid Function Tests: No results for input(s): TSH, T4TOTAL, FREET4, T3FREE, THYROIDAB in the last 72 hours. Anemia Panel: No results for input(s): VITAMINB12,  FOLATE, FERRITIN, TIBC, IRON, RETICCTPCT in the last 72 hours. Urine analysis:    Component Value Date/Time   COLORURINE YELLOW 02/25/2021 0526   APPEARANCEUR CLOUDY (A) 02/25/2021 0526   LABSPEC 1.008 02/25/2021 0526   PHURINE 6.0 02/25/2021 0526   GLUCOSEU NEGATIVE 02/25/2021 0526   HGBUR MODERATE (A) 02/25/2021 0526   BILIRUBINUR NEGATIVE 02/25/2021 0526   KETONESUR 5 (A) 02/25/2021 0526   PROTEINUR 30 (A) 02/25/2021 4782  NITRITE POSITIVE (A) 02/25/2021 0526   LEUKOCYTESUR LARGE (A) 02/25/2021 0526    Sepsis Labs:  None  Personally Reviewed EKG:  Normal sinus rhythm without acute ischemic finding abnormal intervals.  Assessment/Plan * UTI (urinary tract infection)- (present on admission) Difficult diagnosis to make in his situation.  She is not a great historian.  She has overactive bladder with urinary incontinence at baseline.  Has no fever or leukocytosis but some confusion after fall.  No changes to her urine habit but recurrent fall lately.  UA with nitrite, large LE, moderate Hgb and rare bacteria. -Check urine culture -Continue IV ceftriaxone pending urine culture  Fall at home, initial encounter Unknown mechanism of fall but found down on her back with walker on top of her.  She had recurrent fall lately.  She is on Eliquis for A-fib.  She has a pacemaker as well.  No serious injury sustained.  EKG NSR.  Patient's daughter also reports dizziness but not able to describe if it is lightheadedness or vertigo.  CTH, CT cervical spine, CXR, bilateral knee x-ray and pelvic x-ray without acute finding.  Neuro exam unrevealing.  Low suspicion for seizure.  Cannot exclude syncope.  -Fall precaution -PT/OT eval -Telemetry monitoring -Consulted cardiology for PPM interrogation -Check echocardiogram -Continue gentle IV fluid for hydration.   Hypertensive urgency- (present on admission) BP elevated to 221/102 but improved to 158/75 without intervention.  This could be from  pain and anxiety. -Resume home metoprolol and lisinopril -Hydralazine as needed -Manage leg pain with scheduled Tylenol and topical diclofenac.  PAF (paroxysmal atrial fibrillation) ([place])- (present on admission) Currently normal sinus rhythm.  CHA2DS2-VASc score 5.  She is on metoprolol and Eliquis.  Not sure about her A-fib burden.  She also has history of GI bleed but not anemic now.  I had discussion about anticoagulation and risk of bleeding in the setting of a recurrent fall with patient's daughter.  We also discussed her risk of stroke and DVT without anticoagulation.  At this point, I believe risk outweighs the benefit.  She voiced understanding. -Resume home metoprolol  -Continue anticoagulation while in house and discontinue on discharge   Diabetes mellitus without complication ([place]) Does not seem to be on medication for this.  BMP glucose 112. -Monitor glucose with daily lab -May check A1c and start SSI if hyperglycemic.  Presence of cardiac pacemaker- (present on admission) Patient with history of tachybradycardia syndrome.  She is followed by Dr. Caryl Comes.  EKG NSR. -Consulted cardiology for pacemaker interrogation  Multiple sclerosis ([place])- (present on admission) Not on medication at home.   -Pain control as above  Lower extremity pain- (present on admission) She is diffusely tender but no new bruise or signs of injury.  She has old bruises from previous falls. -Scheduled Tylenol -Topical Voltaren gel  Goals of care, counseling/discussion Patient is DNR/DNI which is appropriate.  Confirm with patient's daughter over the phone.  Hyponatremia- (present on admission) Likely due to CKD 4. -Recheck in the morning  CKD (chronic kidney disease) stage 4, GFR 15-29 ml/min (HCC)- (present on admission) Recent Labs    07/07/20 1446 02/25/21 0431  BUN 35* 32*  CREATININE 1.77* 1.93*  -Continue gentle IV fluid   Overactive bladder- (present on admission) - Continue home  medications.       DVT prophylaxis: Patient is on Eliquis.  Code Status: DNR/DNI-confirmed with patient's daughter over the phone. Family Communication: Updated patient's daughter over the phone and son-in-law at the bedside.  Disposition Plan:  Admit to telemetry Consults called: Cardiology for pacemaker interrogation Admission status: Observation Level of care: Telemetry   Mercy Riding MD [facility]  If 7PM-7AM, please contact night-coverage [email or web address]  02/25/2021, 11:54 AM

## 2021-02-25 NOTE — ED Notes (Signed)
Patient transported to CT 

## 2021-02-25 NOTE — ED Notes (Signed)
Patient transported to X-ray 

## 2021-02-25 NOTE — Assessment & Plan Note (Addendum)
Unknown mechanism of fall but found down on her back with walker on top of her.  She had recurrent fall lately.She is on Eliquis for A-fib.  She has a pacemaker as well.-EKG NSR.CTH, CT cervical spine, CXR, bilateral knee x-ray and pelvic x-ray without acute finding.Neuro exam unrevealing.Low suspicion for seizure.Cannot exclude syncope.  Kept overnight hydrated given antibiotics, pacemaker interrogated and obtain echocardiogram unremarkable overall.  Seen by PT in setting of home health.  At this time sent back to baseline and hemodynamically stable.

## 2021-02-25 NOTE — Progress Notes (Signed)
*  PRELIMINARY RESULTS* Echocardiogram 2D Echocardiogram has been performed.  Ashley Nguyen 02/25/2021, 1:30 PM

## 2021-02-25 NOTE — ED Triage Notes (Signed)
Pt was found in floor by family. Pt has been nauseated.

## 2021-02-25 NOTE — Assessment & Plan Note (Addendum)
Not on medication.  A1c stable continue diet control Recent Labs  Lab 02/26/21 0605  HGBA1C 4.9

## 2021-02-25 NOTE — ED Notes (Signed)
Pt has son-in-law at bedside. Pt transferred with two-assist to recliner at bedside. Pt states her legs would feel better if she sat up in a chair and let the dangle. Bedding changed and clean pad placed in recliner with clean sheet before placing patient in recliner.

## 2021-02-25 NOTE — Evaluation (Signed)
Occupational Therapy Evaluation Patient Details Name: Ashley Nguyen MRN: [id number] DOB: [birth year] Today's Date: 02/25/2021   History of Present Illness History provided by patient, patient's daughter and son-in-law.  Patient has no recollection into how she fell.  Per patient's daughter, she was found down on her back with walker on top of her earlier this morning.  They think she is getting back from the bathroom.  Normally wakes up to go to the bathroom between 2 AM and 4 AM.  She had recurrent fall slightly, about 5 in the last few weeks.  Not sure if she hit her head or not.  Per family, she was not paying attention when they found her.  She was also chanting and not aware of the situation.  They think she is confused.  Did not notice seizure-like activity, forming, bowel or bladder accidents.  Did not notice a speech change or facial drooping.  She has no history of seizure.  Multiple family members recovering from cold but patient did not have URI symptoms or fever.  Family denies new medication change.  Son-in-law reports some nausea but no emesis.  Patient reports bilateral leg pain from her feet to her hips.  Worse with movement.  Not able to describe the pain.  Per family, she chronic leg pain.  Patient's daughter reports dizziness which has been ongoing but not able to describe if it is lightheadedness or vertigo.  Patient denies headache, vision change, runny nose, sore throat, chest pain, cough, shortness of breath, nausea, vomiting, abdominal pain, dysuria or frequency.  Denies focal numbness or tingling.  She has incontinence at baseline from overactive bladder.  She is vaccinated against COVID.   Clinical Impression   Pt reports being mostly independent for ADL's at baseline with daughter reportedly assisting with some lower body dressing. Pt generally weak with slow labored movement for bed mobility and transfers. Min A needed with extended time for EOB to [facility] and back with RW. Pt's  daughter available to watch transfer from [facility] to EOB. Daughter is reportedly not present 24/7. SNF recommended due to pt need for physical assist for transfers and ADL's at this time. Pt will benefit from continued OT in the hospital and recommended venue below to increase strength, balance, and endurance for safe ADL's.        Recommendations for follow up therapy are one component of a multi-disciplinary discharge planning process, led by the attending physician.  Recommendations may be updated based on patient status, additional functional criteria and insurance authorization.   Follow Up Recommendations  Skilled nursing-short term rehab (<3 hours/day)    Assistance Recommended at Discharge Frequent or constant Supervision/Assistance  Patient can return home with the following A little help with walking and/or transfers;A lot of help with bathing/dressing/bathroom;Assistance with cooking/housework;Assist for transportation;Help with stairs or ramp for entrance    Functional Status Assessment  Patient has had a recent decline in their functional status and demonstrates the ability to make significant improvements in function in a reasonable and predictable amount of time.  Equipment Recommendations  BSC/3in1 (if pt does not go to SNF)           Precautions / Restrictions Precautions Precautions: Fall Restrictions Weight Bearing Restrictions: No      Mobility Bed Mobility Overal bed mobility: Needs Assistance Bed Mobility: Supine to Sit, Sit to Supine     Supine to sit: Min guard Sit to supine: Min guard   General bed mobility comments: Slow labored movement.  Transfers Overall transfer level: Needs assistance Equipment used: Rolling walker (2 wheels) Transfers: Sit to/from Stand, Bed to chair/wheelchair/BSC Sit to Stand: Min assist     Step pivot transfers: Min assist     General transfer comment: Slow labored movement with RW. Verbal cuing to reach for [facility] and  bed when sitting. Pt fearful of lowering to sit on BSC.      Balance Overall balance assessment: Needs assistance Sitting-balance support: No upper extremity supported, Feet supported Sitting balance-Leahy Scale: Fair Sitting balance - Comments: fair to good seated EOB   Standing balance support: Bilateral upper extremity supported, During functional activity, Reliant on assistive device for balance Standing balance-Leahy Scale: Poor Standing balance comment: with RW                           ADL either performed or assessed with clinical judgement   ADL Overall ADL's : Needs assistance/impaired     Grooming: Minimal assistance;Sitting   Upper Body Bathing: Set up;Sitting   Lower Body Bathing: Maximal assistance   Upper Body Dressing : Set up;Sitting   Lower Body Dressing: Maximal assistance;Total assistance;Sitting/lateral leans   Toilet Transfer: Minimal assistance;Rolling walker (2 wheels);BSC/3in1 Toilet Transfer Details (indicate cue type and reason): slow labored movement; EOB to [facility]         Functional mobility during ADLs: Minimal assistance;Moderate assistance;Rolling walker (2 wheels) General ADL Comments: Pt demosntrates very slow labored movement with reported fear of lowering self to [facility] today.     Vision Baseline Vision/History: 1 Wears glasses Ability to See in Adequate Light: 1 Impaired Patient Visual Report: No change from baseline Vision Assessment?: No apparent visual deficits                Pertinent Vitals/Pain Pain Assessment Pain Assessment: 0-10 Pain Score: 5  Pain Location: legs Pain Descriptors / Indicators: Throbbing Pain Intervention(s): Limited activity within patient's tolerance, Monitored during session, Repositioned     Hand Dominance Right   Extremity/Trunk Assessment Upper Extremity Assessment Upper Extremity Assessment: Generalized weakness   Lower Extremity Assessment Lower Extremity Assessment: Defer to PT  evaluation   Cervical / Trunk Assessment Cervical / Trunk Assessment: Normal   Communication Communication Communication: No difficulties   Cognition Arousal/Alertness: Awake/alert Behavior During Therapy: WFL for tasks assessed/performed Overall Cognitive Status: Within Functional Limits for tasks assessed                                 General Comments: Pt oriented to place but not year. Able to follow commands well.                      Home Living Family/patient expects to be discharged to:: Private residence Living Arrangements: Spouse/significant other;Children Available Help at Discharge: Family;Available 24 hours/day (Husband 24/7 but husband is also older and appeared to be using a RW. Daughter lives in [place] but is not present 24/7.) Type of Home: House Home Access: Ramped entrance     [place]: One level     Bathroom Shower/Tub: Occupational psychologist: Handicapped height Bathroom Accessibility: Yes How Accessible: Accessible via [place]: Conservation officer, nature (2 wheels);Other (comment);Shower seat;Grab bars - toilet;Wheelchair - manual (3 point cane; grab bars walk in shower)          Prior Functioning/Environment Prior Level of Function : Needs assist  Physical Assist : Mobility (physical);ADLs (physical)   ADLs (physical): IADLs;Dressing Mobility Comments: Pt reports household ambulation with RW PRN. ADLs Comments: Pt reports independence with ADL's with family assisting with cooking, cleaning, and grocery shopping. Despit initial report of independence pt later reported that her daughter assist her with donning socks.        OT Problem List: Decreased strength;Decreased activity tolerance;Impaired balance (sitting and/or standing);Obesity      OT Treatment/Interventions: Self-care/ADL training;Therapeutic exercise;DME and/or AE instruction;Therapeutic activities;Patient/family education;Balance  training    OT Goals(Current goals can be found in the care plan section) Acute Rehab OT Goals Patient Stated Goal: return home OT Goal Formulation: With patient/family Time For Goal Achievement: 03/11/21 Potential to Achieve Goals: Fair  OT Frequency: Min 2X/week                  AM-PAC OT "6 Clicks" Daily Activity     Outcome Measure Help from another person eating meals?: None Help from another person taking care of personal grooming?: A Little Help from another person toileting, which includes using toliet, bedpan, or urinal?: A Lot Help from another person bathing (including washing, rinsing, drying)?: A Lot Help from another person to put on and taking off regular upper body clothing?: A Little Help from another person to put on and taking off regular lower body clothing?: A Lot 6 Click Score: 16   End of Session Equipment Utilized During Treatment: Rolling walker (2 wheels)  Activity Tolerance: Patient tolerated treatment well Patient left: in bed;with call bell/phone within reach;with bed alarm set;with family/visitor present  OT Visit Diagnosis: Unsteadiness on feet (R26.81);Other abnormalities of gait and mobility (R26.89);Repeated falls (R29.6);Muscle weakness (generalized) (M62.81);History of falling (Z91.81);Pain Pain - Right/Left:  (both) Pain - part of body: Leg                Time: 4665-9935 OT Time Calculation (min): 23 min Charges:  OT General Charges $OT Visit: 1 Visit OT Evaluation $OT Eval Low Complexity: 1 Low  Basim Bartnik OT, MOT  Larey Seat 02/25/2021, 4:15 PM

## 2021-02-25 NOTE — ED Notes (Signed)
Pt refuses BP. States she cannot tolerate the pain of the cuff inflating. Dr. Matilde Sprang made aware.

## 2021-02-25 NOTE — ED Provider Notes (Signed)
[facility] EMERGENCY DEPARTMENT Provider Note  CSN: [id number] Arrival date & time: 02/25/21 7001  Chief Complaint(s) Fall  HPI Ashley Nguyen is a 88 y.o. female with PMH of multiple sclerosis, paroxysmal A-fib on Eliquis, iron deficiency who presents emergency department for evaluation of a fall and altered mental status.  History obtained from patient's daughter who states that over the last 2 weeks patient has had multiple falls and a progressive decline in her mental status.  Patient has had increasing titration of her diuretic and has had increased incontinence.  Today, she suffered a fall that was unwitnessed, found on the ground and brought to the emergency department.  Suspected downtime approximately 30 minutes.  Patient arrives alert and oriented answering questions appropriately with complaints of bilateral knee pain.   Fall   Past Medical History Past Medical History:  Diagnosis Date   Arthritis    "hands, feet, knees, left hip" (07/04/2013)   Borderline diabetes    Cataracts, bilateral    GERD (gastroesophageal reflux disease)    Headache    "@ least monthly" (07/04/2013)   History of blood transfusion    "after taking Plavix; developed bleeding ulcer; had to have transfusions"   History of colon polyps    History of gastric ulcer    History of GI bleed    Hyperlipidemia    Hypertension    Impaired hearing    Iron deficiency anemia    at times has been on iron supplements   Kidney stones    "passed them"   Migraine    "none in 2015; used to come quite frequently" (07/04/2013)   Osteoarthritis    a. 04/2013 s/p L TKA.   PAF (paroxysmal atrial fibrillation) ([place])    a. 04/2013 periop L TKA.   Seasonal allergies    Skin cancer    "left cheek"    Stage 4 chronic kidney disease (HCC)    Symptomatic bradycardia    TIA (transient ischemic attack) ~ 2002   Vertigo    Patient Active Problem List   Diagnosis Date Noted   Cardiac pacemaker in situ    Long term  current use of anticoagulant therapy    PAF (paroxysmal atrial fibrillation) ([place]) 04/28/2013   History of GI bleed 04/25/2013   Hypertension    GERD (gastroesophageal reflux disease)    Hyperlipidemia    Diabetes mellitus without complication ([place])    Home Medication(s) Prior to Admission medications   Medication Sig Start Date End Date Taking? Authorizing Provider  apixaban (ELIQUIS) 2.5 MG TABS tablet Take 2.5 mg by mouth 2 (two) times daily. 04/09/15   [provider]  Cholecalciferol 125 MCG (5000 UT) TABS Take 1 tablet by mouth daily.    [provider]  colesevelam (WELCHOL) 625 MG tablet Take 1,875 mg by mouth 2 (two) times daily with a meal.     [provider]  fluticasone (FLONASE) 50 MCG/ACT nasal spray Place 2 sprays into both nostrils daily.     [provider]  lisinopril (ZESTRIL) 10 MG tablet Take 10 mg by mouth daily.    [provider]  metoprolol tartrate (LOPRESSOR) 25 MG tablet Take 1 tablet by mouth 2 (two) times daily. 06/24/17   [provider]  omeprazole (PRILOSEC) 20 MG capsule Take 20 mg by mouth daily.    [provider]  solifenacin (VESICARE) 5 MG tablet Take 5 mg by mouth daily. 12/16/20   [provider]  tolterodine (DETROL) 1 MG tablet  Take 1 mg by mouth 2 (two) times daily. Patient not taking: Reported on 01/28/2021 08/05/20   [provider]                                                                                                                                    Past Surgical History Past Surgical History:  Procedure Laterality Date   APPENDECTOMY     as a child   COLONOSCOPY     ESOPHAGOGASTRODUODENOSCOPY     KNEE ARTHROSCOPY Left 11/2012   PACEMAKER INSERTION  07/04/2013   MDT dual chamber Advisa pacemaker implanted by Dr Caryl Comes for 2:1 AV block   PERMANENT PACEMAKER INSERTION N/A 07/04/2013   Procedure: PERMANENT PACEMAKER INSERTION;  Surgeon: Deboraha Sprang, MD;   Location: [facility] CATH LAB;  Service: Cardiovascular;  Laterality: N/A;   SKIN CANCER EXCISION Left ~ 2001   "cheek"   thumb surgery Right 1980's   "made it usable"   TONSILLECTOMY AND ADENOIDECTOMY  ~ [place] Left 04/25/2013   Procedure: TOTAL KNEE ARTHROPLASTY;  Surgeon: Renette Butters, MD;  Location: [place];  Service: Orthopedics;  Laterality: Left;   TUBAL LIGATION  ~ 1970   Family History Family History  Problem Relation Age of Onset   Stroke Father    Heart attack Neg Hx    Heart disease Neg Hx    Diabetes Neg Hx     Social History Social History   Tobacco Use   Smoking status: Never   Smokeless tobacco: Never  Vaping Use   Vaping Use: Never used  Substance Use Topics   Alcohol use: No   Drug use: No   Allergies Desipramine hcl, Plavix [clopidogrel bisulfate], Statins, Clindamycin, Clams [shellfish allergy], Lipitor [atorvastatin], Penicillins, Sulfa antibiotics, and Verapamil  Review of Systems Review of Systems  Musculoskeletal:  Positive for arthralgias and myalgias.   Physical Exam Vital Signs  I have reviewed the triage vital signs BP (!) 221/202 Comment: pt was screaming in pain while taking her blood pressure.   Pulse 88    Temp (!) 97 F (36.1 C)    Resp 16    Ht 5\' 5"  (1.651 m)    Wt 100.1 kg    SpO2 94%    BMI 36.72 kg/m   Physical Exam Vitals and nursing note reviewed.  Constitutional:      General: She is not in acute distress.    Appearance: She is well-developed.  HENT:     Head: Normocephalic and atraumatic.  Eyes:     Conjunctiva/sclera: Conjunctivae normal.  Cardiovascular:     Rate and Rhythm: Normal rate and regular rhythm.     Heart sounds: No murmur heard. Pulmonary:     Effort: Pulmonary effort is normal. No respiratory distress.     Breath sounds: Normal breath sounds.  Abdominal:     Palpations: Abdomen is soft.  Tenderness: There is no abdominal tenderness.  Musculoskeletal:        General: Tenderness  present. No swelling.     Cervical back: Neck supple.  Skin:    General: Skin is warm and dry.     Capillary Refill: Capillary refill takes less than 2 seconds.  Neurological:     Mental Status: She is alert.  Psychiatric:        Mood and Affect: Mood normal.    ED Results and Treatments Labs (all labs ordered are listed, but only abnormal results are displayed) Labs Reviewed  COMPREHENSIVE METABOLIC PANEL  CBC WITH DIFFERENTIAL/PLATELET  CK  URINALYSIS, ROUTINE W REFLEX MICROSCOPIC                                                                                                                          Radiology No results found.  Pertinent labs & imaging results that were available during my care of the patient were reviewed by me and considered in my medical decision making (see MDM for details).  Medications Ordered in ED Medications  ondansetron (ZOFRAN) injection 4 mg (has no administration in time range)                                                                                                                                     Procedures Procedures  (including critical care time)  Medical Decision Making / ED Course   This patient presents to the ED for concern of falls, altered mental status, this involves an extensive number of treatment options, and is a complaint that carries with it a high risk of complications and morbidity.  The differential diagnosis includes electrolyte abnormality, urinary tract infection, deconditioning, fracture, ICH  MDM: Patient seen the emergency department for evaluation of frequent falls on a blood thinner.  Physical exam reveals tenderness in the lower extremities but is otherwise unremarkable.  Laboratory evaluation with an elevated creatinine to 1.93, BUN 32, CK slightly elevated to 242, urinalysis with positive nitrite, large leuk esterase, greater than 50 white blood cells concerning for UTI.  On reevaluation, I had a long  discussion with the family and they are very concerned about the patient's frequent falls.  As the patient is still slightly altered with a high risk of fall in the setting of an active urinary tract infection, she will be admitted for IV antibiotics and physical  therapy evaluation.  Patient then admitted.   Additional history obtained: -Additional history obtained from son -External records from outside source obtained and reviewed including: Chart review including previous notes, labs, imaging, consultation notes   Lab Tests: -I ordered, reviewed, and interpreted labs.   The pertinent results include:   Labs Reviewed  COMPREHENSIVE METABOLIC PANEL  CBC WITH DIFFERENTIAL/PLATELET  CK  URINALYSIS, ROUTINE W REFLEX MICROSCOPIC      EKG   EKG Interpretation  Date/Time:  Tuesday February 25 2021 03:43:18 EST Ventricular Rate:  88 PR Interval:  205 QRS Duration: 103 QT Interval:  385 QTC Calculation: 466 R Axis:   13 Text Interpretation: Sinus rhythm Confirmed by Kaja Jackowski ([id number]) on 02/25/2021 6:40:59 AM         Imaging Studies ordered: I ordered imaging studies including CTH, CT C spine, XR Chest, pelvis, knees I independently visualized and interpreted imaging. I agree with the radiologist interpretation   Medicines ordered and prescription drug management: Meds ordered this encounter  Medications   ondansetron (ZOFRAN) injection 4 mg    -I have reviewed the patients home medicines and have made adjustments as needed  Critical interventions none    Cardiac Monitoring: The patient was maintained on a cardiac monitor.  I personally viewed and interpreted the cardiac monitored which showed an underlying rhythm of: NSR  Social Determinants of Health:  Factors impacting patients care include: none   Reevaluation: After the interventions noted above, I reevaluated the patient and found that they have :stayed the same  Co morbidities that complicate the  patient evaluation  Past Medical History:  Diagnosis Date   Arthritis    "hands, feet, knees, left hip" (07/04/2013)   Borderline diabetes    Cataracts, bilateral    GERD (gastroesophageal reflux disease)    Headache    "@ least monthly" (07/04/2013)   History of blood transfusion    "after taking Plavix; developed bleeding ulcer; had to have transfusions"   History of colon polyps    History of gastric ulcer    History of GI bleed    Hyperlipidemia    Hypertension    Impaired hearing    Iron deficiency anemia    at times has been on iron supplements   Kidney stones    "passed them"   Migraine    "none in 2015; used to come quite frequently" (07/04/2013)   Osteoarthritis    a. 04/2013 s/p L TKA.   PAF (paroxysmal atrial fibrillation) ([place])    a. 04/2013 periop L TKA.   Seasonal allergies    Skin cancer    "left cheek"    Stage 4 chronic kidney disease (HCC)    Symptomatic bradycardia    TIA (transient ischemic attack) ~ 2002   Vertigo       Dispostion: I considered admission for this patient, and in the setting of frequent falls, altered mental status and UTI she will be admitted for IV antibiotics.     Final Clinical Impression(s) / ED Diagnoses Final diagnoses:  None     @PCDICTATION @    Teressa Lower, MD 02/25/21 [phone]

## 2021-02-25 NOTE — Assessment & Plan Note (Addendum)
Stable.  Continue home medications. 

## 2021-02-25 NOTE — Assessment & Plan Note (Addendum)
history of tachybradycardia syndrome.sees Dr. Caryl Comes.  EKG NSR. Consulted cardiology sp pacemaker interrogation-no acute finding

## 2021-02-25 NOTE — Assessment & Plan Note (Addendum)
Recent Labs    07/07/20 1446 02/25/21 0431 02/26/21 0605  BUN 35* 32* 25*  CREATININE 1.77* 1.93* 1.73*  Stable at baseline.  Encourage oral intake

## 2021-02-25 NOTE — Plan of Care (Signed)
°  Problem: Acute Rehab OT Goals (only OT should resolve) Goal: Pt. Will Perform Grooming Flowsheets (Taken 02/25/2021 1618) Pt Will Perform Grooming:  with modified independence  standing Goal: Pt. Will Perform Upper Body Dressing Flowsheets (Taken 02/25/2021 1618) Pt Will Perform Upper Body Dressing:  with modified independence  sitting Goal: Pt. Will Perform Lower Body Dressing Flowsheets (Taken 02/25/2021 1618) Pt Will Perform Lower Body Dressing:  with min guard assist  with min assist  sitting/lateral leans  with adaptive equipment Goal: Pt. Will Transfer To Toilet Flowsheets (Taken 02/25/2021 1618) Pt Will Transfer to Toilet:  with modified independence  stand pivot transfer Goal: Pt/Caregiver Will Perform Home Exercise Program Flowsheets (Taken 02/25/2021 1618) Pt/caregiver will Perform Home Exercise Program:  Increased strength  Both right and left upper extremity  Independently  Therasa Lorenzi OT, MOT

## 2021-02-25 NOTE — Assessment & Plan Note (Signed)
Patient is DNR/DNI which is appropriate.  Confirm with patient's daughter over the phone.

## 2021-02-25 NOTE — Assessment & Plan Note (Addendum)
Urine culture insignificant growth.Complete course w/ po antibiotics.

## 2021-02-25 NOTE — Hospital Course (Addendum)
88 year old F with PMH of MS, CKD-4, PAF on Eliquis, PPM, HTN, GERD, GIB, overactive bladder and HLD presented to ED after fall at home. History provided by patient, patient's daughter and son-in-law.  Patient has no recollection into how she fell.  Per patient's daughter, she was found down on her back with walker on top of her earlier this morning.  They think she is getting back from the bathroom.  Normally wakes up to go to the bathroom between 2 AM and 4 AM.  She had recurrent fall slightly, about 5 in the last few weeks.  Not sure if she hit her head or not.  Per family, she was not paying attention when they found her.  She was also chanting and not aware of the situation.  They think she is confused.  Did not notice seizure-like activity, forming, bowel or bladder accidents.  Did not notice a speech change or facial drooping.  She has no history of seizure.  Multiple family members recovering from cold but patient did not have URI symptoms or fever.  Family denies new medication change.  Son-in-law reports some nausea but no emesis.  Patient reports bilateral leg pain from her feet to her hips.  Worse with movement.  Not able to describe the pain.  Per family, she chronic leg pain.  Patient's daughter reports dizziness which has been ongoing but not able to describe if it is lightheadedness or vertigo.  Patient denies headache, vision change, runny nose, sore throat, chest pain, cough, shortness of breath, nausea, vomiting, abdominal pain, dysuria or frequency.  Denies focal numbness or tingling.  She has incontinence at baseline from overactive bladder.  She is vaccinated against COVID. She lives with family.  Uses walker at baseline.  In the ED, BP 221/102>>> 158/75.  Afebrile.  Normal saturation on RA.  Na 131.  Glucose 119. Cr 1.93 (1.77 in 06/2020).  BUN 32.  Otherwise, CMP and CBC with differential without significant finding.  CK 242.  UA with nitrite, large LE, moderate Hgb and rare bacteria.   COVID-19 and influenza PCR nonreactive.  Personally reviewed twelve-lead EKG shows NSR without acute ischemic finding or abnormal intervals.  CT head, CT cervical spine, CXR, left knee and pelvic x-ray without acute finding.  Right knee x-ray with DDD, small suprapatellar bursal effusion but no fracture or dislocation.  CT renal stone study ordered.  Received IV ceftriaxone.  Hospitalist service called for admission for UTI and recurrent fall. Patient was admitted for presumed UTI treated with IV antibiotics.  Urine culture with less than 10,000 colonies insignificant growth.  Completing a course of oral antibiotic.  Given her fall at home underwent pacemaker interrogation no acute finding given gentle IV fluid hydration, echocardiogram was obtained-EF 55 to 60%, G2 DD normal RV function aortic valve is tricuspid mitral valve is normal. On my exam patient appears alert awake oriented x3 at baseline.  At this time remains hemodynamically stable on admission-HTN urgency in 220/102.  She had her breakfast/meal well.  PT OT.  PT suggested home health 3IN1.  TOC has set up all this equipments for discharge to home.

## 2021-02-25 NOTE — Assessment & Plan Note (Addendum)
BP elevated to 221/102.At this time improved to 140s to 160s.  She will continue her home metoprolol lisinopril and rest of the medication

## 2021-02-25 NOTE — ED Notes (Signed)
Charging device to interrogate heart Scientist, product/process development.

## 2021-02-25 NOTE — ED Notes (Signed)
Used [employer] device to Engineer, mining. Information sent over at 10:01

## 2021-02-25 NOTE — Assessment & Plan Note (Addendum)
Continue Voltaren gel and pain control

## 2021-02-26 DIAGNOSIS — N3001 Acute cystitis with hematuria: Secondary | ICD-10-CM | POA: Diagnosis not present

## 2021-02-26 DIAGNOSIS — N390 Urinary tract infection, site not specified: Secondary | ICD-10-CM | POA: Diagnosis not present

## 2021-02-26 LAB — MAGNESIUM: Magnesium: 1.8 mg/dL (ref 1.7–2.4)

## 2021-02-26 LAB — URINE CULTURE: Culture: <10000 — AB

## 2021-02-26 LAB — CBC
HCT: 33.5 % — ABNORMAL LOW (ref 36.0–46.0)
Hemoglobin: 10.8 g/dL — ABNORMAL LOW (ref 12.0–15.0)
MCH: 31.2 pg (ref 26.0–34.0)
MCHC: 32.2 g/dL (ref 30.0–36.0)
MCV: 96.8 fL (ref 80.0–100.0)
Platelets: 237 10*3/uL (ref 150–400)
RBC: 3.46 MIL/uL — ABNORMAL LOW (ref 3.87–5.11)
RDW: 13.2 % (ref 11.5–15.5)
WBC: 8.3 10*3/uL (ref 4.0–10.5)
nRBC: 0 % (ref 0.0–0.2)

## 2021-02-26 LAB — RENAL FUNCTION PANEL
Albumin: 3.6 g/dL (ref 3.5–5.0)
Anion gap: 6 (ref 5–15)
BUN: 25 mg/dL — ABNORMAL HIGH (ref 8–23)
CO2: 24 mmol/L (ref 22–32)
Calcium: 8.6 mg/dL — ABNORMAL LOW (ref 8.9–10.3)
Chloride: 103 mmol/L (ref 98–111)
Creatinine, Ser: 1.73 mg/dL — ABNORMAL HIGH (ref 0.44–1.00)
GFR, Estimated: 28 mL/min — ABNORMAL LOW (ref >60)
Glucose, Bld: 96 mg/dL (ref 70–99)
Phosphorus: 3.4 mg/dL (ref 2.5–4.6)
Potassium: 3.8 mmol/L (ref 3.5–5.1)
Sodium: 133 mmol/L — ABNORMAL LOW (ref 135–145)

## 2021-02-26 LAB — HEMOGLOBIN A1C
Hgb A1c MFr Bld: 4.9 % (ref 4.8–5.6)
Mean Plasma Glucose: 93.93 mg/dL

## 2021-02-26 LAB — CK: Total CK: 299 U/L — ABNORMAL HIGH (ref 38–234)

## 2021-02-26 MED ORDER — CEFADROXIL 500 MG PO CAPS: 500.0000 mg | ORAL_CAPSULE | Freq: Two times a day (BID) | ORAL | 0 refills | Status: DC

## 2021-02-26 NOTE — Care Management Obs Status (Signed)
[facility] NOTIFICATION   Patient Details  Name: Ashley Nguyen MRN: [id number] Date of Birth: 1932-10-03   Medicare Observation Status Notification Given:  Yes    Tommy Medal 02/26/2021, 2:57 PM

## 2021-02-26 NOTE — Discharge Summary (Signed)
Physician Discharge Summary   Patient: Ashley Nguyen MRN: [id number] DOB: 07-Oct-1932  Admit date:     02/25/2021  Discharge date: 02/26/21  Discharge Physician: Antonieta Pert   PCP: Aletha Halim., PA-C   Recommendations at discharge:   Follow-up with cardiologist Dr. Caryl Comes regarding anticoagulation as it is now on hold after discussing risks and benefits in the light of recent fall Follow-up with PCP check CBC BMP in 1 week  Discharge Diagnoses: Principal Problem:   UTI (urinary tract infection) Active Problems:   Diabetes mellitus without complication (HCC)   PAF (paroxysmal atrial fibrillation) (HCC)   Presence of cardiac pacemaker   Overactive bladder   CKD (chronic kidney disease) stage 4, GFR 15-29 ml/min ([place])   Fall at home, initial encounter   Hypertensive urgency   Hyponatremia   Goals of care, counseling/discussion   Lower extremity pain   Multiple sclerosis ([place])  Resolved Problems:   * No resolved hospital problems. *   Hospital Course: 88 year old F with PMH of MS, CKD-4, PAF on Eliquis, PPM, HTN, GERD, GIB, overactive bladder and HLD presented to ED after fall at home. History provided by patient, patient's daughter and son-in-law.  Patient has no recollection into how she fell.  Per patient's daughter, she was found down on her back with walker on top of her earlier this morning.  They think she is getting back from the bathroom.  Normally wakes up to go to the bathroom between 2 AM and 4 AM.  She had recurrent fall slightly, about 5 in the last few weeks.  Not sure if she hit her head or not.  Per family, she was not paying attention when they found her.  She was also chanting and not aware of the situation.  They think she is confused.  Did not notice seizure-like activity, forming, bowel or bladder accidents.  Did not notice a speech change or facial drooping.  She has no history of seizure.  Multiple family members recovering from cold but patient did not have  URI symptoms or fever.  Family denies new medication change.  Son-in-law reports some nausea but no emesis.  Patient reports bilateral leg pain from her feet to her hips.  Worse with movement.  Not able to describe the pain.  Per family, she chronic leg pain.  Patient's daughter reports dizziness which has been ongoing but not able to describe if it is lightheadedness or vertigo.  Patient denies headache, vision change, runny nose, sore throat, chest pain, cough, shortness of breath, nausea, vomiting, abdominal pain, dysuria or frequency.  Denies focal numbness or tingling.  She has incontinence at baseline from overactive bladder.  She is vaccinated against COVID. She lives with family.  Uses walker at baseline.  In the ED, BP 221/102>>> 158/75.  Afebrile.  Normal saturation on RA.  Na 131.  Glucose 119. Cr 1.93 (1.77 in 06/2020).  BUN 32.  Otherwise, CMP and CBC with differential without significant finding.  CK 242.  UA with nitrite, large LE, moderate Hgb and rare bacteria.  COVID-19 and influenza PCR nonreactive.  Personally reviewed twelve-lead EKG shows NSR without acute ischemic finding or abnormal intervals.  CT head, CT cervical spine, CXR, left knee and pelvic x-ray without acute finding.  Right knee x-ray with DDD, small suprapatellar bursal effusion but no fracture or dislocation.  CT renal stone study ordered.  Received IV ceftriaxone.  Hospitalist service called for admission for UTI and recurrent fall. Patient was admitted for presumed UTI  treated with IV antibiotics.  Urine culture with less than 10,000 colonies insignificant growth.  Completing a course of oral antibiotic.  Given her fall at home underwent pacemaker interrogation no acute finding given gentle IV fluid hydration, echocardiogram was obtained-EF 55 to 60%, G2 DD normal RV function aortic valve is tricuspid mitral valve is normal. On my exam patient appears alert awake oriented x3 at baseline.  At this time remains hemodynamically  stable on admission-HTN urgency in 220/102.  She had her breakfast/meal well.  PT OT.  PT suggested home health 3IN1.  TOC has set up all this equipments for discharge to home.  Assessment and Plan: * UTI (urinary tract infection)- (present on admission) Urine culture insignificant growth.Complete course w/ po antibiotics.   Multiple sclerosis ([place])- (present on admission) Not on medication at home.Pain control as above  Lower extremity pain- (present on admission) Continue Voltaren gel and pain control  Goals of care, counseling/discussion Patient is DNR/DNI which is appropriate.  Confirm with patient's daughter over the phone.  Hyponatremia- (present on admission) Likely due to CKD 4.  Stable Recent Labs  Lab 02/25/21 0431 02/26/21 0605  NA 131* 133*    Hypertensive urgency- (present on admission) BP elevated to 221/102.At this time improved to 140s to 160s.  She will continue her home metoprolol lisinopril and rest of the medication  Fall at home, initial encounter Unknown mechanism of fall but found down on her back with walker on top of her.  She had recurrent fall lately.She is on Eliquis for A-fib.  She has a pacemaker as well.-EKG NSR.CTH, CT cervical spine, CXR, bilateral knee x-ray and pelvic x-ray without acute finding.Neuro exam unrevealing.Low suspicion for seizure.Cannot exclude syncope.  Kept overnight hydrated given antibiotics, pacemaker interrogated and obtain echocardiogram unremarkable overall.  Seen by PT in setting of home health.  At this time sent back to baseline and hemodynamically stable.  CKD (chronic kidney disease) stage 4, GFR 15-29 ml/min (HCC)- (present on admission) Recent Labs    07/07/20 1446 02/25/21 0431 02/26/21 0605  BUN 35* 32* 25*  CREATININE 1.77* 1.93* 1.73*  Stable at baseline.  Encourage oral intake   Overactive bladder- (present on admission) Stable.  Continue home medications.  Presence of cardiac pacemaker- (present on  admission) history of tachybradycardia syndrome.sees Dr. Caryl Comes.  EKG NSR. Consulted cardiology sp pacemaker interrogation-no acute finding  PAF (paroxysmal atrial fibrillation) ([place])- (present on admission) In normal sinus rhythm, continue metoprolol..Not sure about her A-fib burden.  She also has history of GI bleed but not anemic now. Dr Cyndia Skeeters had discussion about anticoagulation and risk of bleeding in the setting of a recurrent fall with patient's daughter-discussed her risk of stroke and DVT without anticoagulation. And plan is to discontinue anticoagulation upon discharge, I recommend her follow-up with Dr. Caryl Comes to reassess   Diabetes mellitus without complication [facility]) Not on medication.  A1c stable continue diet control Recent Labs  Lab 02/26/21 0605  HGBA1C 4.9      Consultants: cardiology Procedures performed: none  Disposition: Home health Diet recommendation:  Diet Orders (From admission, onward)     Start     Ordered   02/25/21 0830  Diet regular Room service appropriate? Yes; Fluid consistency: Thin  Diet effective now       Question Answer Comment  Room service appropriate? Yes   Fluid consistency: Thin      02/25/21 0830            DISCHARGE MEDICATION: Allergies as of  02/26/2021       Reactions   Desipramine Hcl Other (See Comments)   "painful urination, then kidneys shut down for hours."   Plavix [clopidogrel Bisulfate] Other (See Comments)   "Almost bled to death."   Statins Other (See Comments)   Extreme pain in legs with some swelling   Clindamycin Other (See Comments)   Patient complains of severe stomach burning.   Clams [shellfish Allergy] Nausea And Vomiting   Per pt, NOT a general shellfish allergy.  Clams only.   Lipitor [atorvastatin] Other (See Comments)   Muscle aches.   Penicillins Hives   Sulfa Antibiotics Hives   Verapamil Swelling        Medication List     STOP taking these medications    apixaban 2.5 MG Tabs  tablet Commonly known as: ELIQUIS       TAKE these medications    cefadroxil 500 MG capsule Commonly known as: DURICEF Take 1 capsule (500 mg total) by mouth 2 (two) times daily for 2 days.   Cholecalciferol 125 MCG (5000 UT) Tabs Take 1 tablet by mouth daily.   colesevelam 625 MG tablet Commonly known as: WELCHOL Take 1,875 mg by mouth 2 (two) times daily with a meal.   fluticasone 50 MCG/ACT nasal spray Commonly known as: FLONASE Place 2 sprays into both nostrils daily.   lisinopril 10 MG tablet Commonly known as: ZESTRIL Take 20 mg by mouth daily.   metoprolol tartrate 25 MG tablet Commonly known as: LOPRESSOR Take 1 tablet by mouth 2 (two) times daily.   omeprazole 20 MG capsule Commonly known as: PRILOSEC Take 20 mg by mouth daily.   solifenacin 5 MG tablet Commonly known as: VESICARE Take 5 mg by mouth daily.        Follow-up Information     [facility] Follow up.   Why: 3N1 will be delivered to your home. Contact information: [address] [place] [address] [phone]         Arnoldo Lenis, MD. Call in 1 week(s).   Specialty: Cardiology Why: to discuss/revisit about eliquis Contact information: [address] [place] [address] [phone]                Discharge Exam: Danley Danker Weights   02/25/21 0335 02/25/21 1130  Weight: 100.1 kg 94.2 kg  Condition at discharge: good  The results of significant diagnostics from this hospitalization (including imaging, microbiology, ancillary and laboratory) are listed below for reference.   Imaging Studies: DG Knee 2 Views Left  Result Date: 02/25/2021 CLINICAL DATA:  Found on floor.  Fall. EXAM: LEFT KNEE - 1-2 VIEW COMPARISON:  07/07/2020 FINDINGS: Total knee arthroplasty. Prosthesis is well seated and located. There may be anterior soft tissue swelling. No acute fracture or subluxation. IMPRESSION: No acute finding. Electronically Signed   By:  Jorje Guild M.D.   On: 02/25/2021 04:33   DG Knee 2 Views Right  Result Date: 02/25/2021 CLINICAL DATA:  Fall injury. EXAM: RIGHT KNEE - 1-2 VIEW COMPARISON:  None. FINDINGS: There is mild femorotibial joint space loss, moderate patellofemoral joint space loss, and moderate tricompartmental marginal osteophytes, with anterior enthesopathic changes of the patella. There is no evidence of erosive arthropathy. There is osteopenia without evidence of fractures. Small suprapatellar bursal effusion is noted. There is a dystrophic subcutaneous calcification in the posterior aspect of the distal thigh. No metallic foreign body is seen. IMPRESSION: Osteopenia and degenerative changes, and a small suprapatellar  bursal effusion, without evidence of fractures. Electronically Signed   By: Telford Nab M.D.   On: 02/25/2021 04:35   CT Head Wo Contrast  Result Date: 02/25/2021 CLINICAL DATA:  Found on floor. Altered mental status and diffuse pain EXAM: CT HEAD WITHOUT CONTRAST CT CERVICAL SPINE WITHOUT CONTRAST TECHNIQUE: Multidetector CT imaging of the head and cervical spine was performed following the standard protocol without intravenous contrast. Multiplanar CT image reconstructions of the cervical spine were also generated. RADIATION DOSE REDUCTION: This exam was performed according to the departmental dose-optimization program which includes automated exposure control, adjustment of the mA and/or kV according to patient size and/or use of iterative reconstruction technique. COMPARISON:  Head CT 11/19/2020 FINDINGS: CT HEAD FINDINGS Brain: No evidence of acute infarction, hemorrhage, hydrocephalus, extra-axial collection or mass lesion/mass effect. Confluent chronic small vessel ischemia in the hemispheric white matter. Cerebral volume loss in keeping with age. Vascular: No hyperdense vessel or unexpected calcification. Skull: Normal. Negative for fracture or focal lesion. Sinuses/Orbits: No acute finding. CT  CERVICAL SPINE FINDINGS Alignment: No traumatic malalignment Skull base and vertebrae: No acute fracture Soft tissues and spinal canal: No prevertebral fluid or swelling. No visible canal hematoma. Disc levels: C2-3 incomplete segmentation. Ordinary degenerative disc narrowing with ridging. Upper chest: Negative IMPRESSION: 1. No evidence of acute intracranial or cervical spine injury. 2. Confluent chronic small vessel ischemia in the hemispheric white matter. Electronically Signed   By: Jorje Guild M.D.   On: 02/25/2021 04:07   CT Cervical Spine Wo Contrast  Result Date: 02/25/2021 CLINICAL DATA:  Found on floor. Altered mental status and diffuse pain EXAM: CT HEAD WITHOUT CONTRAST CT CERVICAL SPINE WITHOUT CONTRAST TECHNIQUE: Multidetector CT imaging of the head and cervical spine was performed following the standard protocol without intravenous contrast. Multiplanar CT image reconstructions of the cervical spine were also generated. RADIATION DOSE REDUCTION: This exam was performed according to the departmental dose-optimization program which includes automated exposure control, adjustment of the mA and/or kV according to patient size and/or use of iterative reconstruction technique. COMPARISON:  Head CT 11/19/2020 FINDINGS: CT HEAD FINDINGS Brain: No evidence of acute infarction, hemorrhage, hydrocephalus, extra-axial collection or mass lesion/mass effect. Confluent chronic small vessel ischemia in the hemispheric white matter. Cerebral volume loss in keeping with age. Vascular: No hyperdense vessel or unexpected calcification. Skull: Normal. Negative for fracture or focal lesion. Sinuses/Orbits: No acute finding. CT CERVICAL SPINE FINDINGS Alignment: No traumatic malalignment Skull base and vertebrae: No acute fracture Soft tissues and spinal canal: No prevertebral fluid or swelling. No visible canal hematoma. Disc levels: C2-3 incomplete segmentation. Ordinary degenerative disc narrowing with ridging.  Upper chest: Negative IMPRESSION: 1. No evidence of acute intracranial or cervical spine injury. 2. Confluent chronic small vessel ischemia in the hemispheric white matter. Electronically Signed   By: Jorje Guild M.D.   On: 02/25/2021 04:07   DG Pelvis Portable  Result Date: 02/25/2021 CLINICAL DATA:  Found on floor.  Nausea. EXAM: PORTABLE PELVIS 1-2 VIEWS COMPARISON:  None. FINDINGS: There is no evidence of pelvic fracture or diastasis. No pelvic bone lesions are seen. Lower lumbar degenerative spurring. IMPRESSION: Negative. Electronically Signed   By: Jorje Guild M.D.   On: 02/25/2021 04:34   DG Chest Portable 1 View  Result Date: 02/25/2021 CLINICAL DATA:  Found on floor.  Fall. EXAM: PORTABLE CHEST 1 VIEW COMPARISON:  11/19/2020 FINDINGS: Chronic cardiomegaly.  Stable dual-chamber pacer placement. Artifact from EKG leads. Mild streaky density which is likely atelectasis in the  setting of low lung volumes. There is no edema, consolidation, effusion, or pneumothorax. IMPRESSION: No active disease. Electronically Signed   By: Jorje Guild M.D.   On: 02/25/2021 04:32   ECHOCARDIOGRAM COMPLETE  Result Date: 02/25/2021    ECHOCARDIOGRAM REPORT   Patient Name:   Ashley Morsch Date of Exam: 02/25/2021 Medical Rec #:  [id number]        Height:       65.0 in Accession #:    [id number]       Weight:       207.7 lb Date of Birth:  Dec 23, 1932        BSA:          2.010 m Patient Age:    88 years         BP:           186/94 mmHg Patient Gender: F                HR:           72 bpm. Exam Location:  [facility] Procedure: 2D Echo, Cardiac Doppler and Color Doppler Indications:    Fall  History:        Patient has prior history of Echocardiogram examinations, most                 recent 04/26/2013. Pacemaker, TIA, Arrythmias:Atrial Fibrillation;                 Risk Factors:Hypertension, Diabetes and Dyslipidemia. Vertigo                 (From Hx).  Sonographer:    Alvino Chapel RCS Referring Phys: [id number]  Charlesetta Ivory GONFA IMPRESSIONS  1. Left ventricular ejection fraction, by estimation, is 55 to 60%. The left ventricle has normal function. Left ventricular endocardial border not optimally defined to evaluate regional wall motion. Left ventricular diastolic parameters are consistent with Grade II diastolic dysfunction (pseudonormalization).  2. Right ventricular systolic function is normal. The right ventricular size is normal. There is mildly elevated pulmonary artery systolic pressure.  3. Left atrial size was moderately dilated.  4. The mitral valve is grossly normal. No evidence of mitral valve regurgitation. No evidence of mitral stenosis.  5. The aortic valve is tricuspid. Aortic valve regurgitation is mild. Aortic valve sclerosis is present, with no evidence of aortic valve stenosis.  6. The inferior vena cava is normal in size with greater than 50% respiratory variability, suggesting right atrial pressure of 3 mmHg. Comparison(s): A prior study was performed on 04/26/2013. No significant change from prior study. FINDINGS  Left Ventricle: Left ventricular ejection fraction, by estimation, is 55 to 60%. The left ventricle has normal function. Left ventricular endocardial border not optimally defined to evaluate regional wall motion. The left ventricular internal cavity size was normal in size. There is no left ventricular hypertrophy. Left ventricular diastolic parameters are consistent with Grade II diastolic dysfunction (pseudonormalization). Right Ventricle: The right ventricular size is normal. No increase in right ventricular wall thickness. Right ventricular systolic function is normal. There is mildly elevated pulmonary artery systolic pressure. The tricuspid regurgitant velocity is 2.90  m/s, and with an assumed right atrial pressure of 3 mmHg, the estimated right ventricular systolic pressure is 88.5 mmHg. Left Atrium: Left atrial size was moderately dilated. Right Atrium: Right atrial size was normal in  size. Pericardium: Trivial pericardial effusion is present. Presence of epicardial fat layer. Mitral Valve: The mitral valve is grossly normal. Mild  mitral annular calcification. No evidence of mitral valve regurgitation. No evidence of mitral valve stenosis. Tricuspid Valve: The tricuspid valve is normal in structure. Tricuspid valve regurgitation is mild . No evidence of tricuspid stenosis. Aortic Valve: The aortic valve is tricuspid. Aortic valve regurgitation is mild. Aortic regurgitation PHT measures 766 msec. Aortic valve sclerosis is present, with no evidence of aortic valve stenosis. Aortic valve mean gradient measures 7.0 mmHg. Aortic valve peak gradient measures 14.9 mmHg. Aortic valve area, by VTI measures 1.56 cm. Pulmonic Valve: The pulmonic valve was not well visualized. Pulmonic valve regurgitation is not visualized. No evidence of pulmonic stenosis. Aorta: The aortic root is normal in size and structure. Venous: The inferior vena cava is normal in size with greater than 50% respiratory variability, suggesting right atrial pressure of 3 mmHg. IAS/Shunts: No atrial level shunt detected by color flow Doppler. Additional Comments: A device lead is visualized.  LEFT VENTRICLE PLAX 2D LVIDd:         4.20 cm   Diastology LVIDs:         2.90 cm   LV e' medial:    5.55 cm/s LV PW:         1.20 cm   LV E/e' medial:  16.1 LV IVS:        1.00 cm   LV e' lateral:   6.85 cm/s LVOT diam:     1.70 cm   LV E/e' lateral: 13.1 LV SV:         75 LV SV Index:   37 LVOT Area:     2.27 cm  RIGHT VENTRICLE RV S prime:     9.79 cm/s TAPSE (M-mode): 1.8 cm LEFT ATRIUM             Index        RIGHT ATRIUM           Index LA diam:        3.60 cm 1.79 cm/m   RA Area:     15.40 cm LA Vol (A2C):   83.4 ml 41.49 ml/m  RA Volume:   37.40 ml  18.61 ml/m LA Vol (A4C):   79.5 ml 39.53 ml/m LA Biplane Vol: 83.8 ml 41.69 ml/m  AORTIC VALVE AV Area (Vmax):    1.39 cm AV Area (Vmean):   1.56 cm AV Area (VTI):     1.56 cm AV  Vmax:           193.00 cm/s AV Vmean:          124.000 cm/s AV VTI:            0.479 m AV Peak Grad:      14.9 mmHg AV Mean Grad:      7.0 mmHg LVOT Vmax:         118.00 cm/s LVOT Vmean:        85.200 cm/s LVOT VTI:          0.329 m LVOT/AV VTI ratio: 0.69 AI PHT:            766 msec  AORTA Ao Root diam: 3.30 cm MITRAL VALVE                TRICUSPID VALVE MV Area (PHT): 3.31 cm     TR Peak grad:   33.6 mmHg MV Decel Time: 229 msec     TR Vmax:        290.00 cm/s MV E velocity: 89.50 cm/s MV A velocity: 112.00 cm/s  SHUNTS MV E/A ratio:  0.80         Systemic VTI:  0.33 m                             Systemic Diam: 1.70 cm Rudean Haskell MD Electronically signed by Rudean Haskell MD Signature Date/Time: 02/25/2021/4:02:50 PM    Final    CT Renal Stone Study  Result Date: 02/25/2021 CLINICAL DATA:  88 year old female with history of microscopic hematuria. Evaluate for nephrolithiasis. EXAM: CT ABDOMEN AND PELVIS WITHOUT CONTRAST TECHNIQUE: Multidetector CT imaging of the abdomen and pelvis was performed following the standard protocol without IV contrast. RADIATION DOSE REDUCTION: This exam was performed according to the departmental dose-optimization program which includes automated exposure control, adjustment of the mA and/or kV according to patient size and/or use of iterative reconstruction technique. COMPARISON:  No priors. FINDINGS: Lower chest: Mild scarring in the lung bases bilaterally. Atherosclerotic calcifications in the descending thoracic aorta as well as the right coronary artery. Calcifications of the mitral annulus and aortic valve. Pacemaker leads in the right atrium and right ventricle. Hepatobiliary: No suspicious cystic or solid hepatic lesions are confidently identified on today's noncontrast CT examination. Several densely calcified gallstones are noted in the gallbladder measuring up to 1.6 cm in diameter. Gallbladder is nearly decompressed, without surrounding inflammatory changes  to suggest an associated acute cholecystitis at this time. Pancreas: No definite pancreatic mass or peripancreatic fluid collections or inflammatory changes are noted on today's noncontrast CT examination. Spleen: Unremarkable. Adrenals/Urinary Tract: There are no abnormal calcifications within the collecting system of either kidney, along the course of either ureter, or within the lumen of the urinary bladder. No hydroureteronephrosis or perinephric stranding to suggest urinary tract obstruction at this time. The unenhanced appearance of the kidneys is remarkable for mild-to-moderate bilateral renal atrophy. Unenhanced appearance of the urinary bladder is normal. Bilateral adrenal glands are normal in appearance. Stomach/Bowel: Unenhanced appearance of the stomach is normal. No pathologic dilatation of small bowel or colon. Numerous colonic diverticulae are noted, particularly in the sigmoid colon, without surrounding inflammatory changes to suggest an acute diverticulitis at this time. The appendix is not confidently identified and may be surgically absent. Regardless, there are no inflammatory changes noted adjacent to the cecum to suggest the presence of an acute appendicitis at this time. Vascular/Lymphatic: Aortic atherosclerosis. No lymphadenopathy noted in the abdomen or pelvis. Reproductive: Unenhanced appearance of the uterus and ovaries is unremarkable. Other: No significant volume of ascites.  No pneumoperitoneum. Musculoskeletal: There are no aggressive appearing lytic or blastic lesions noted in the visualized portions of the skeleton. IMPRESSION: 1. No urinary tract calculi no findings of urinary tract obstruction. 2. Cholelithiasis without evidence of acute cholecystitis. 3. Colonic diverticulosis without evidence to suggest an acute diverticulitis at this time. 4. Aortic atherosclerosis, in addition to least right coronary artery disease. 5. Additional incidental findings, as above. Electronically  Signed   By: Vinnie Langton M.D.   On: 02/25/2021 07:15    Microbiology: Results for orders placed or performed during the hospital encounter of 02/25/21  Urine Culture     Status: Abnormal   Collection Time: 02/25/21  7:11 AM   Specimen: Urine, Catheterized  Result Value Ref Range Status   Specimen Description   Final    URINE, CATHETERIZED Performed at [facility], [address] [place], [place] [address]    Special Requests   Final    NONE Performed at  [address] [place], [place] [address]    Culture (A)  Final    <10,000 COLONIES/mL INSIGNIFICANT GROWTH Performed at [facility] [address], [place], [place] [address]    Report Status 02/26/2021 FINAL  Final  Resp Panel by RT-PCR (Flu A&B, Covid) Nasopharyngeal Swab     Status: None   Collection Time: 02/25/21 10:06 AM   Specimen: Nasopharyngeal Swab; Nasopharyngeal(NP) swabs in vial transport medium  Result Value Ref Range Status   SARS Coronavirus 2 by RT PCR NEGATIVE NEGATIVE Final    Comment: (NOTE) SARS-CoV-2 target nucleic acids are NOT DETECTED.  The SARS-CoV-2 RNA is generally detectable in upper respiratory specimens during the acute phase of infection. The lowest concentration of SARS-CoV-2 viral copies this assay can detect is 138 copies/mL. A negative result does not preclude SARS-Cov-2 infection and should not be used as the sole basis for treatment or other patient management decisions. A negative result may occur with  improper specimen collection/handling, submission of specimen other than nasopharyngeal swab, presence of viral mutation(s) within the areas targeted by this assay, and inadequate number of viral copies(<138 copies/mL). A negative result must be combined with clinical observations, patient history, and epidemiological information. The expected result is Negative.  Fact Sheet for Patients:  EntrepreneurPulse.com.au  Fact Sheet for Healthcare  Providers:  IncredibleEmployment.be  This test is no t yet approved or cleared by the [place] FDA and  has been authorized for detection and/or diagnosis of SARS-CoV-2 by FDA under an Emergency Use Authorization (EUA). This EUA will remain  in effect (meaning this test can be used) for the duration of the COVID-19 declaration under Section 564(b)(1) of the Act, 21 U.S.C.section 360bbb-3(b)(1), unless the authorization is terminated  or revoked sooner.       Influenza A by PCR NEGATIVE NEGATIVE Final   Influenza B by PCR NEGATIVE NEGATIVE Final    Comment: (NOTE) The Xpert Xpress SARS-CoV-2/FLU/RSV plus assay is intended as an aid in the diagnosis of influenza from Nasopharyngeal swab specimens and should not be used as a sole basis for treatment. Nasal washings and aspirates are unacceptable for Xpert Xpress SARS-CoV-2/FLU/RSV testing.  Fact Sheet for Patients: EntrepreneurPulse.com.au  Fact Sheet for Healthcare Providers: IncredibleEmployment.be  This test is not yet approved or cleared by the [place] FDA and has been authorized for detection and/or diagnosis of SARS-CoV-2 by FDA under an Emergency Use Authorization (EUA). This EUA will remain in effect (meaning this test can be used) for the duration of the COVID-19 declaration under Section 564(b)(1) of the Act, 21 U.S.C. section 360bbb-3(b)(1), unless the authorization is terminated or revoked.  Performed at [facility], [address] [place], [place] [address]     Labs: CBC: Recent Labs  Lab 02/25/21 0431 02/26/21 0605  WBC 7.8 8.3  NEUTROABS 6.3  --   HGB 12.0 10.8*  HCT 37.8 33.5*  MCV 95.0 96.8  PLT 260 762   Basic Metabolic Panel: Recent Labs  Lab 02/25/21 0431 02/26/21 0605  NA 131* 133*  K 3.9 3.8  CL 96* 103  CO2 23 24  GLUCOSE 119* 96  BUN 32* 25*  CREATININE 1.93* 1.73*  CALCIUM 9.3 8.6*  MG  --  1.8  PHOS  --  3.4    Liver Function Tests: Recent Labs  Lab 02/25/21 0431 02/26/21 0605  AST 24  --   ALT 21  --   ALKPHOS 50  --   BILITOT 0.8  --  PROT 7.5  --   ALBUMIN 4.2 3.6   CBG: No results for input(s): GLUCAP in the last 168 hours.  Discharge time spent: less than 30 minutes.  Signed: Antonieta Pert, MD [facility] 02/26/2021

## 2021-02-26 NOTE — TOC Transition Note (Addendum)
Transition of Care [facility]) - CM/SW Discharge Note   Patient Details  Name: Ashley Nguyen MRN: [id number] Date of Birth: 1932-05-20  Transition of Care [facility]) CM/SW Contact:  Boneta Lucks, RN Phone Number: 02/26/2021, 4:24 PM   Clinical Narrative:   Patient discharging home. Needing a 3N1, ordered and gave referral to [facility] with [employer], it will be dropped shipped.   PT recommended HHPT/OT patient is agreeable. TOC waiting to see what home health company will offer. TOC to follow in the morning. Patient has discharged home.   AddendumLattie Haw with [facility] accepted the referral. They will do start of care next week. Patient updated.   Final next level of care: [place] Barriers to Discharge: Barriers Resolved   Patient Goals and CMS Choice Patient states their goals for this hospitalization and ongoing recovery are:: to go home. CMS Medicare.gov Compare Post Acute Care list provided to:: Patient Choice offered to / list presented to : Patient  Discharge Placement              Name of family member notified: Husband Patient and family notified of of transfer: 02/26/21  Discharge Plan and Services               DME Arranged: 3-N-1   Date DME Agency Contacted: 02/26/21 Time DME Agency Contacted: 6 Representative spoke with at DME Agency: Caryl Pina

## 2021-02-26 NOTE — Evaluation (Signed)
Physical Therapy Evaluation Patient Details Name: Ashley Nguyen MRN: [id number] DOB: 1933-01-01 Today's Date: 02/26/2021  History of Present Illness  History provided by patient, patient's daughter and son-in-law.  Patient has no recollection into how she fell.  Per patient's daughter, she was found down on her back with walker on top of her earlier this morning.  They think she is getting back from the bathroom.  Normally wakes up to go to the bathroom between 2 AM and 4 AM.  She had recurrent fall slightly, about 5 in the last few weeks.  Not sure if she hit her head or not.  Per family, she was not paying attention when they found her.  She was also chanting and not aware of the situation.  They think she is confused.  Did not notice seizure-like activity, forming, bowel or bladder accidents.  Did not notice a speech change or facial drooping.  She has no history of seizure.  Multiple family members recovering from cold but patient did not have URI symptoms or fever.  Family denies new medication change.  Son-in-law reports some nausea but no emesis.  Patient reports bilateral leg pain from her feet to her hips.  Worse with movement.  Not able to describe the pain.  Per family, she chronic leg pain.  Patient's daughter reports dizziness which has been ongoing but not able to describe if it is lightheadedness or vertigo.  Patient denies headache, vision change, runny nose, sore throat, chest pain, cough, shortness of breath, nausea, vomiting, abdominal pain, dysuria or frequency.  Denies focal numbness or tingling.  She has incontinence at baseline from overactive bladder.  She is vaccinated against COVID.   Clinical Impression  Patient functioning near baseline for functional mobility and gait requiring verbal/tactile cueing for proper hand placement during sit to stands and transfers, demonstrates fair/good return for ambulating in room with slow labored movement without loss of balance and tolerated  sitting up in chair after therapy with family members present in room - nursing staff notified.  Plan:  patient to be discharged from hospital to day and discharged from physical therapy to care of nursing for ambulation daily as tolerated for length of stay.         Recommendations for follow up therapy are one component of a multi-disciplinary discharge planning process, led by the attending physician.  Recommendations may be updated based on patient status, additional functional criteria and insurance authorization.  Follow Up Recommendations Home health PT    Assistance Recommended at Discharge Intermittent Supervision/Assistance  Patient can return home with the following  A little help with walking and/or transfers;A little help with bathing/dressing/bathroom;Help with stairs or ramp for entrance;Assistance with cooking/housework    Equipment Recommendations BSC/3in1  Recommendations for Other Services       Functional Status Assessment Patient has had a recent decline in their functional status and demonstrates the ability to make significant improvements in function in a reasonable and predictable amount of time.     Precautions / Restrictions Precautions Precautions: Fall Restrictions Weight Bearing Restrictions: No      Mobility  Bed Mobility Overal bed mobility: Needs Assistance Bed Mobility: Supine to Sit     Supine to sit: Supervision, Min guard, HOB elevated     General bed mobility comments: increased time, required HOB slightly rasied due to back discomfort, had to use bed rail to pull self to sitting (states she uses table at home when gitting, lift chair)    Transfers Overall  transfer level: Needs assistance Equipment used: Rolling walker (2 wheels) Transfers: Sit to/from Stand, Bed to chair/wheelchair/BSC Sit to Stand: Min assist   Step pivot transfers: Min assist       General transfer comment: labored movement, required verbal cues to proper hand  placement during sit to stands    Ambulation/Gait Ambulation/Gait assistance: Min assist Gait Distance (Feet): 25 Feet Assistive device: Rolling walker (2 wheels) Gait Pattern/deviations: Decreased step length - right, Decreased step length - left, Decreased stride length Gait velocity: decreased     General Gait Details: slow labored cadence with increased time to make turns, no loss of balance, limited mostly due to fatigue  Stairs            Wheelchair Mobility    Modified Rankin (Stroke Patients Only)       Balance Overall balance assessment: Needs assistance Sitting-balance support: Feet supported, No upper extremity supported Sitting balance-Leahy Scale: Good Sitting balance - Comments: fair/good seated at EOB   Standing balance support: During functional activity, Bilateral upper extremity supported, Reliant on assistive device for balance Standing balance-Leahy Scale: Fair Standing balance comment: using RW                             Pertinent Vitals/Pain Pain Assessment Pain Assessment: No/denies pain    Home Living Family/patient expects to be discharged to:: Private residence Living Arrangements: Spouse/significant other;Children Available Help at Discharge: Family;Available 24 hours/day Type of Home: House Home Access: Ramped entrance       Home Layout: One level Home Equipment: Conservation officer, nature (2 wheels);Other (comment);Shower seat;Grab bars - toilet;Wheelchair - manual      Prior Function Prior Level of Function : Needs assist       Physical Assist : Mobility (physical);ADLs (physical) Mobility (physical): Bed mobility;Transfers;Gait;Stairs   Mobility Comments: Pt reports household ambulation with RW PRN. ADLs Comments: Pt reports independence with ADL's with family assisting with cooking, cleaning, and grocery shopping. Despit initial report of independence pt later reported that her daughter assist her with donning socks.      Hand Dominance   Dominant Hand: Right    Extremity/Trunk Assessment   Upper Extremity Assessment Upper Extremity Assessment: Generalized weakness    Lower Extremity Assessment Lower Extremity Assessment: Generalized weakness    Cervical / Trunk Assessment Cervical / Trunk Assessment: Normal  Communication   Communication: No difficulties  Cognition Arousal/Alertness: Awake/alert Behavior During Therapy: WFL for tasks assessed/performed Overall Cognitive Status: Within Functional Limits for tasks assessed                                          General Comments      Exercises     Assessment/Plan    PT Assessment All further PT needs can be met in the next venue of care  PT Problem List Decreased strength;Decreased activity tolerance;Decreased balance;Decreased mobility       PT Treatment Interventions      PT Goals (Current goals can be found in the Care Plan section)  Acute Rehab PT Goals Patient Stated Goal: return home with family to assist PT Goal Formulation: With patient Time For Goal Achievement: 02/26/21 Potential to Achieve Goals: Good    Frequency       Co-evaluation               AM-PAC  PT "6 Clicks" Mobility  Outcome Measure Help needed turning from your back to your side while in a flat bed without using bedrails?: A Little Help needed moving from lying on your back to sitting on the side of a flat bed without using bedrails?: A Little Help needed moving to and from a bed to a chair (including a wheelchair)?: A Little Help needed standing up from a chair using your arms (e.g., wheelchair or bedside chair)?: A Little Help needed to walk in hospital room?: A Lot Help needed climbing 3-5 steps with a railing? : A Lot 6 Click Score: 16    End of Session   Activity Tolerance: Patient tolerated treatment well;Patient limited by fatigue Patient left: in chair;with call bell/phone within reach;with family/visitor  present Nurse Communication: Mobility status PT Visit Diagnosis: Unsteadiness on feet (R26.81);Other abnormalities of gait and mobility (R26.89);Muscle weakness (generalized) (M62.81)    Time: 5883-2549 PT Time Calculation (min) (ACUTE ONLY): 19 min   Charges:   PT Evaluation $PT Eval Low Complexity: 1 Low PT Treatments $Therapeutic Activity: 8-22 mins        2:23 PM, 02/26/21 Lonell Grandchild, MPT Physical Therapist with [facility] [phone] office [phone] mobile phone

## 2021-02-28 ENCOUNTER — Other Ambulatory Visit: Payer: Self-pay

## 2021-02-28 ENCOUNTER — Ambulatory Visit (INDEPENDENT_AMBULATORY_CARE_PROVIDER_SITE_OTHER): Payer: Medicare Other | Admitting: Cardiology

## 2021-02-28 ENCOUNTER — Encounter: Payer: Self-pay | Admitting: Cardiology

## 2021-02-28 VITALS — BP 100/58 | HR 66 | Ht 65.0 in | Wt 220.0 lb

## 2021-02-28 DIAGNOSIS — I480 Paroxysmal atrial fibrillation: Secondary | ICD-10-CM | POA: Diagnosis not present

## 2021-02-28 DIAGNOSIS — R42 Dizziness and giddiness: Secondary | ICD-10-CM | POA: Diagnosis not present

## 2021-02-28 NOTE — Progress Notes (Signed)
Clinical Summary Ashley Nguyen is a 88 y.o.female seen today for follow up of the following medical problems. This is a focused visit on history of afib and frequent falls.    1. PAF - no palpitations - 10+ falls over the last few months     2. Pacemaker -Jan 2023 normal device function    3.Dizzines/Falls -ER visit 11/2020 with fall  - 11/2020 normal device check - Jan 2022 normal device check - has had some soft bp's at visits.  -5% dizzienss on detrol. Changed to vesicare by pcp   - reports multiple falls over the last few weeks - last episode 1 week ago. Episode in bedroom. Came in from church, was standing and changing clothes. Felt off balance and started to fall by caught her self on the bed.  - often with getting up out of bed at night quickly gets dizzy, falls.   - admitted 02/2021 with fall - admitted with UTI -normal pacemaker check - ongoing orthostatic symptoms.       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, stationed in [place]     Past Medical History:  Diagnosis Date   Arthritis    "hands, feet, knees, left hip" (07/04/2013)   Borderline diabetes    Cataracts, bilateral    GERD (gastroesophageal reflux disease)    Headache    "@ least monthly" (07/04/2013)   History of blood transfusion    "after taking Plavix; developed bleeding ulcer; had to have transfusions"   History of colon polyps    History of gastric ulcer    History of GI bleed    Hyperlipidemia    Hypertension    Impaired hearing    Iron deficiency anemia    at times has been on iron supplements   Kidney stones    "passed them"   Migraine    "none in 2015; used to come quite frequently" (07/04/2013)   Osteoarthritis    a. 04/2013 s/p L TKA.   PAF (paroxysmal atrial fibrillation) ([place])    a. 04/2013 periop L TKA.   Seasonal allergies    Skin cancer    "left cheek"    Stage 4 chronic kidney disease (HCC)    Symptomatic bradycardia     TIA (transient ischemic attack) ~ 2002   Vertigo      Allergies  Allergen Reactions   Desipramine Hcl Other (See Comments)    "painful urination, then kidneys shut down for hours."   Plavix [Clopidogrel Bisulfate] Other (See Comments)    "Almost bled to death."   Statins Other (See Comments)    Extreme pain in legs with some swelling   Clindamycin Other (See Comments)    Patient complains of severe stomach burning.   Clams [Shellfish Allergy] Nausea And Vomiting    Per pt, NOT a general shellfish allergy.  Clams only.   Lipitor [Atorvastatin] Other (See Comments)    Muscle aches.   Penicillins Hives   Sulfa Antibiotics Hives   Verapamil Swelling     Current Outpatient Medications  Medication Sig Dispense Refill   cefadroxil (DURICEF) 500 MG capsule Take 1 capsule (500 mg total) by mouth 2 (two) times daily for 2 days. 4 capsule 0   Cholecalciferol 125 MCG (5000 UT) TABS Take 1 tablet by mouth daily.     colesevelam (WELCHOL) 625 MG tablet Take 1,875 mg by mouth 2 (two) times daily with a meal.  fluticasone (FLONASE) 50 MCG/ACT nasal spray Place 2 sprays into both nostrils daily.      lisinopril (ZESTRIL) 10 MG tablet Take 20 mg by mouth daily.     metoprolol tartrate (LOPRESSOR) 25 MG tablet Take 1 tablet by mouth 2 (two) times daily.     omeprazole (PRILOSEC) 20 MG capsule Take 20 mg by mouth daily.     solifenacin (VESICARE) 5 MG tablet Take 5 mg by mouth daily.     No current facility-administered medications for this visit.     Past Surgical History:  Procedure Laterality Date   APPENDECTOMY     as a child   COLONOSCOPY     ESOPHAGOGASTRODUODENOSCOPY     KNEE ARTHROSCOPY Left 11/2012   PACEMAKER INSERTION  07/04/2013   MDT dual chamber Advisa pacemaker implanted by Dr Caryl Comes for 2:1 AV block   PERMANENT PACEMAKER INSERTION N/A 07/04/2013   Procedure: PERMANENT PACEMAKER INSERTION;  Surgeon: Deboraha Sprang, MD;  Location: [facility] CATH LAB;  Service:  Cardiovascular;  Laterality: N/A;   SKIN CANCER EXCISION Left ~ 2001   "cheek"   thumb surgery Right 1980's   "made it usable"   TONSILLECTOMY AND ADENOIDECTOMY  ~ [place] Left 04/25/2013   Procedure: TOTAL KNEE ARTHROPLASTY;  Surgeon: Renette Butters, MD;  Location: [facility];  Service: Orthopedics;  Laterality: Left;   TUBAL LIGATION  ~ 1970     Allergies  Allergen Reactions   Desipramine Hcl Other (See Comments)    "painful urination, then kidneys shut down for hours."   Plavix [Clopidogrel Bisulfate] Other (See Comments)    "Almost bled to death."   Statins Other (See Comments)    Extreme pain in legs with some swelling   Clindamycin Other (See Comments)    Patient complains of severe stomach burning.   Clams [Shellfish Allergy] Nausea And Vomiting    Per pt, NOT a general shellfish allergy.  Clams only.   Lipitor [Atorvastatin] Other (See Comments)    Muscle aches.   Penicillins Hives   Sulfa Antibiotics Hives   Verapamil Swelling      Family History  Problem Relation Age of Onset   Stroke Father    Heart attack Neg Hx    Heart disease Neg Hx    Diabetes Neg Hx      Social History Ms. Schorsch reports that she has never smoked. She has never used smokeless tobacco. Ms. Botelho reports no history of alcohol use.   Review of Systems CONSTITUTIONAL: No weight loss, fever, chills, weakness or fatigue.  HEENT: Eyes: No visual loss, blurred vision, double vision or yellow sclerae.No hearing loss, sneezing, congestion, runny nose or sore throat.  SKIN: No rash or itching.  CARDIOVASCULAR: per hpi RESPIRATORY: No shortness of breath, cough or sputum.  GASTROINTESTINAL: No anorexia, nausea, vomiting or diarrhea. No abdominal pain or blood.  GENITOURINARY: No burning on urination, no polyuria NEUROLOGICAL: No headache, dizziness, syncope, paralysis, ataxia, numbness or tingling in the extremities. No change in bowel or bladder control.   MUSCULOSKELETAL: No muscle, back pain, joint pain or stiffness.  LYMPHATICS: No enlarged nodes. No history of splenectomy.  PSYCHIATRIC: No history of depression or anxiety.  ENDOCRINOLOGIC: No reports of sweating, cold or heat intolerance. No polyuria or polydipsia.  [name]   Physical Examination Today's Vitals   02/28/21 1317  BP: (!) 100/58  Pulse: 66  SpO2: 100%  Weight: 220 lb (99.8 kg)  Height: 5\' 5"  (1.651 m)  Body mass index is 36.61 kg/m.  Gen: resting comfortably, no acute distress HEENT: no scleral icterus, pupils equal round and reactive, no palptable cervical adenopathy,  CV: RRR, no mrg, no jvd Resp: Clear to auscultation bilaterally GI: abdomen is soft, non-tender, non-distended, normal bowel sounds, no hepatosplenomegaly MSK: extremities are warm, no edema.  Skin: warm, no rash Neuro:  no focal deficits Psych: appropriate affect   Diagnostic Studies     Assessment and Plan   1. PAF - 10+falls over the last several weeks - discussed with patient with family present, at this time joint decision to hold eliqis for now given risk of bleeding and falls.       2. Dizzienss/Falls - episodes sound like orthostatic dizziness/falls. Cannot confirm as she is not able to do orthostatics today due to knee pains - device checks have been normal, no evidence of arrhythmia as cause - last visit we stopped hydralazine, today we will stop lisionpril. Continue aggressive hydration. If ongoing symptoms perhpaps wean lopressor, or consider possible florinef or midodrine trial.      Arnoldo Lenis, M.D.

## 2021-02-28 NOTE — Patient Instructions (Addendum)
Medication Instructions:  STOP Lisinopril  STOP Eliquis  Follow-Up: Follow up with Dr. Harl Bowie in 2 months.   Any Other Special Instructions Will Be Listed Below (If Applicable).     If you need a refill on your cardiac medications before your next appointment, please call your pharmacy.

## 2021-03-06 ENCOUNTER — Telehealth: Payer: Self-pay

## 2021-03-06 NOTE — Telephone Encounter (Signed)
Spoke with patient and scheduled a [place] for 03/10/21 @ 2PM.  Consent obtained; updated Outlook/Netsmart/Team List and Epic.

## 2021-03-10 ENCOUNTER — Other Ambulatory Visit: Payer: Self-pay

## 2021-03-10 ENCOUNTER — Other Ambulatory Visit: Payer: Self-pay | Admitting: Family Medicine

## 2021-03-12 ENCOUNTER — Other Ambulatory Visit: Payer: Self-pay

## 2021-03-12 ENCOUNTER — Encounter (HOSPITAL_COMMUNITY): Payer: Self-pay | Admitting: Emergency Medicine

## 2021-03-12 DIAGNOSIS — R519 Headache, unspecified: Secondary | ICD-10-CM | POA: Diagnosis present

## 2021-03-12 NOTE — ED Triage Notes (Signed)
Pt c/o headache with nausea since last night.  

## 2021-03-13 ENCOUNTER — Emergency Department (HOSPITAL_COMMUNITY)
Admission: EM | Admit: 2021-03-13 | Discharge: 2021-03-13 | Disposition: A | Discharge status: Home / Self Care | Payer: Medicare Other | Attending: Emergency Medicine | Admitting: Emergency Medicine

## 2021-03-13 ENCOUNTER — Emergency Department (HOSPITAL_COMMUNITY): Payer: Medicare Other

## 2021-03-13 DIAGNOSIS — R519 Headache, unspecified: Secondary | ICD-10-CM | POA: Diagnosis not present

## 2021-03-13 LAB — URINALYSIS, ROUTINE W REFLEX MICROSCOPIC
Bilirubin Urine: NEGATIVE
Glucose, UA: NEGATIVE mg/dL
Hgb urine dipstick: NEGATIVE
Ketones, ur: NEGATIVE mg/dL
Leukocytes,Ua: NEGATIVE
Nitrite: NEGATIVE
Protein, ur: NEGATIVE mg/dL
Specific Gravity, Urine: 1.014 (ref 1.005–1.030)
pH: 5 (ref 5.0–8.0)

## 2021-03-13 LAB — COMPREHENSIVE METABOLIC PANEL
ALT: 16 U/L (ref 0–44)
AST: 18 U/L (ref 15–41)
Albumin: 4.3 g/dL (ref 3.5–5.0)
Alkaline Phosphatase: 76 U/L (ref 38–126)
Anion gap: 9 (ref 5–15)
BUN: 29 mg/dL — ABNORMAL HIGH (ref 8–23)
CO2: 24 mmol/L (ref 22–32)
Calcium: 9.3 mg/dL (ref 8.9–10.3)
Chloride: 99 mmol/L (ref 98–111)
Creatinine, Ser: 1.64 mg/dL — ABNORMAL HIGH (ref 0.44–1.00)
GFR, Estimated: 30 mL/min — ABNORMAL LOW (ref >60)
Glucose, Bld: 109 mg/dL — ABNORMAL HIGH (ref 70–99)
Potassium: 4 mmol/L (ref 3.5–5.1)
Sodium: 132 mmol/L — ABNORMAL LOW (ref 135–145)
Total Bilirubin: 1.1 mg/dL (ref 0.3–1.2)
Total Protein: 8.6 g/dL — ABNORMAL HIGH (ref 6.5–8.1)

## 2021-03-13 LAB — CBC WITH DIFFERENTIAL/PLATELET
Abs Immature Granulocytes: 0.04 10*3/uL (ref 0.00–0.07)
Basophils Absolute: 0 10*3/uL (ref 0.0–0.1)
Basophils Relative: 0 %
Eosinophils Absolute: 0.1 10*3/uL (ref 0.0–0.5)
Eosinophils Relative: 1 %
HCT: 39.6 % (ref 36.0–46.0)
Hemoglobin: 12.7 g/dL (ref 12.0–15.0)
Immature Granulocytes: 0 %
Lymphocytes Relative: 18 %
Lymphs Abs: 1.7 10*3/uL (ref 0.7–4.0)
MCH: 30.8 pg (ref 26.0–34.0)
MCHC: 32.1 g/dL (ref 30.0–36.0)
MCV: 96.1 fL (ref 80.0–100.0)
Monocytes Absolute: 0.7 10*3/uL (ref 0.1–1.0)
Monocytes Relative: 8 %
Neutro Abs: 6.8 10*3/uL (ref 1.7–7.7)
Neutrophils Relative %: 73 %
Platelets: 286 10*3/uL (ref 150–400)
RBC: 4.12 MIL/uL (ref 3.87–5.11)
RDW: 13.3 % (ref 11.5–15.5)
WBC: 9.4 10*3/uL (ref 4.0–10.5)
nRBC: 0 % (ref 0.0–0.2)

## 2021-03-13 MED ORDER — PROCHLORPERAZINE EDISYLATE 10 MG/2ML IJ SOLN
10.0000 mg | Freq: Once | INTRAMUSCULAR | Status: AC
  Administered 2021-03-13: 10 mg via INTRAVENOUS
  Filled 2021-03-13: qty 2

## 2021-03-13 MED ORDER — ACETAMINOPHEN 500 MG PO TABS
1000.0000 mg | ORAL_TABLET | Freq: Once | ORAL | Status: AC
  Administered 2021-03-13: 1000 mg via ORAL
  Filled 2021-03-13: qty 2

## 2021-03-13 MED ORDER — LACTATED RINGERS IV BOLUS
1000.0000 mL | Freq: Once | INTRAVENOUS | Status: AC
  Administered 2021-03-13: 1000 mL via INTRAVENOUS

## 2021-03-13 MED ORDER — METOPROLOL TARTRATE 5 MG/5ML IV SOLN
5.0000 mg | Freq: Once | INTRAVENOUS | Status: AC
  Administered 2021-03-13: 5 mg via INTRAVENOUS
  Filled 2021-03-13: qty 5

## 2021-03-13 MED ORDER — DIPHENHYDRAMINE HCL 50 MG/ML IJ SOLN
25.0000 mg | Freq: Once | INTRAMUSCULAR | Status: AC
Start: 2021-03-13 — End: 2021-03-13
  Administered 2021-03-13: 25 mg via INTRAVENOUS
  Filled 2021-03-13: qty 1

## 2021-03-13 NOTE — ED Provider Notes (Signed)
[facility] EMERGENCY DEPARTMENT Provider Note   CSN: [id number] Arrival date & time: 03/12/21  1855     History  Chief Complaint  Patient presents with   Headache    Ashley Nguyen is a 88 y.o. female.   Headache Pain location:  Frontal Quality:  Sharp and dull Radiates to:  Does not radiate Severity currently:  Unable to specify Severity at highest:  Unable to specify Onset quality:  Gradual Duration:  1 day Timing:  Constant Progression:  Worsening Chronicity:  New Similar to prior headaches: yes   Context: not eating and not intercourse   Relieved by:  None tried Worsened by:  Nothing     Home Medications Prior to Admission medications   Medication Sig Start Date End Date Taking? Authorizing Provider  Cholecalciferol 125 MCG (5000 UT) TABS Take 1 tablet by mouth daily.    [provider]  colesevelam (WELCHOL) 625 MG tablet Take 1,875 mg by mouth 2 (two) times daily with a meal.     [provider]  fluticasone (FLONASE) 50 MCG/ACT nasal spray Place 2 sprays into both nostrils daily.     [provider]  metoprolol tartrate (LOPRESSOR) 25 MG tablet Take 1 tablet by mouth 2 (two) times daily. 06/24/17   [provider]  omeprazole (PRILOSEC) 20 MG capsule Take 20 mg by mouth daily.    [provider]  solifenacin (VESICARE) 5 MG tablet Take 5 mg by mouth daily. 12/16/20   [provider]      Allergies    Desipramine hcl, Plavix [clopidogrel bisulfate], Statins, Clindamycin, Clams [shellfish allergy], Lipitor [atorvastatin], Penicillins, Sulfa antibiotics, and Verapamil    Review of Systems   Review of Systems  Neurological:  Positive for headaches.  All other systems reviewed and are negative.  Physical Exam Updated Vital Signs BP (!) 182/76    Pulse 71    Temp (!) 97.5 F (36.4 C) (Oral)    Resp 16    Ht 5\' 5"  (1.651 m)    Wt 99.8 kg    SpO2 97%    BMI 36.61 kg/m  Physical Exam Vitals and nursing  note reviewed.  Constitutional:      Appearance: She is well-developed.  HENT:     Head: Normocephalic and atraumatic.  Eyes:     General: No visual field deficit.    Extraocular Movements: Extraocular movements intact.  Cardiovascular:     Rate and Rhythm: Normal rate and regular rhythm.  Pulmonary:     Effort: No respiratory distress.     Breath sounds: No stridor.  Abdominal:     General: There is no distension.  Musculoskeletal:        General: No swelling or tenderness. Normal range of motion.     Cervical back: Normal range of motion.  Skin:    General: Skin is warm and dry.  Neurological:     Mental Status: She is alert.     Cranial Nerves: No cranial nerve deficit or facial asymmetry.     Sensory: No sensory deficit.     Motor: No weakness.  Psychiatric:        Mood and Affect: Mood normal.    ED Results / Procedures / Treatments   Labs (all labs ordered are listed, but only abnormal results are displayed) Labs Reviewed  COMPREHENSIVE METABOLIC PANEL - Abnormal; Notable for the following components:      Result Value   Sodium 132 (*)    Glucose, Bld  109 (*)    BUN 29 (*)    Creatinine, Ser 1.64 (*)    Total Protein 8.6 (*)    GFR, Estimated 30 (*)    All other components within normal limits  CBC WITH DIFFERENTIAL/PLATELET  URINALYSIS, ROUTINE W REFLEX MICROSCOPIC    EKG None  Radiology CT Head Wo Contrast  Result Date: 03/13/2021 CLINICAL DATA:  Headache and nausea. EXAM: CT HEAD WITHOUT CONTRAST TECHNIQUE: Contiguous axial images were obtained from the base of the skull through the vertex without intravenous contrast. RADIATION DOSE REDUCTION: This exam was performed according to the departmental dose-optimization program which includes automated exposure control, adjustment of the mA and/or kV according to patient size and/or use of iterative reconstruction technique. COMPARISON:  February 25, 2021 FINDINGS: Brain: There is mild to moderate severity  cerebral atrophy with widening of the extra-axial spaces and ventricular dilatation. There are areas of decreased attenuation within the white matter tracts of the supratentorial brain, consistent with microvascular disease changes. Vascular: No hyperdense vessel or unexpected calcification. Skull: Normal. Negative for fracture or focal lesion. Sinuses/Orbits: No acute finding. Other: None. IMPRESSION: 1. No acute intracranial abnormality. 2. Generalized cerebral atrophy with chronic white matter small vessel ischemic changes. Electronically Signed   By: Virgina Norfolk M.D.   On: 03/13/2021 01:21    Procedures Procedures  heada  Medications Ordered in ED Medications  lactated ringers bolus 1,000 mL (1,000 mLs Intravenous New Bag/Given 03/13/21 0121)  acetaminophen (TYLENOL) tablet 1,000 mg (1,000 mg Oral Given 03/13/21 0117)  metoprolol tartrate (LOPRESSOR) injection 5 mg (5 mg Intravenous Given 03/13/21 0120)  metoprolol tartrate (LOPRESSOR) injection 5 mg (5 mg Intravenous Given 03/13/21 0302)  prochlorperazine (COMPAZINE) injection 10 mg (10 mg Intravenous Given 03/13/21 0259)  diphenhydrAMINE (BENADRYL) injection 25 mg (25 mg Intravenous Given 03/13/21 0300)    ED Course/ Medical Decision Making/ A&P                           Medical Decision Making Amount and/or Complexity of Data Reviewed Labs: ordered. Radiology: ordered.  Risk OTC drugs. Prescription drug management.   Headache improved. No e/o bleed from recent falls. No longer on eliquis. No other significant abnormalities.  Discussed talking to PCP about rehab/ALF or other options to reduce falling and reduce stress on family.    Final Clinical Impression(s) / ED Diagnoses Final diagnoses:  Nonintractable headache, unspecified chronicity pattern, unspecified headache type    Rx / DC Orders ED Discharge Orders     None         Geovanni Rahming, Corene Cornea, MD 03/13/21 [phone]

## 2021-03-18 ENCOUNTER — Other Ambulatory Visit: Payer: Self-pay

## 2021-03-18 ENCOUNTER — Other Ambulatory Visit: Payer: Self-pay | Admitting: Family Medicine

## 2021-03-20 ENCOUNTER — Telehealth: Payer: Self-pay

## 2021-03-20 NOTE — Telephone Encounter (Signed)
Spoke with patient regarding rescheduling Palliative Care consult. She declined services. Will cancel referral.  ?

## 2021-04-14 ENCOUNTER — Ambulatory Visit: Payer: Medicare Other | Admitting: Cardiology

## 2021-04-24 ENCOUNTER — Ambulatory Visit (INDEPENDENT_AMBULATORY_CARE_PROVIDER_SITE_OTHER): Payer: Medicare Other

## 2021-04-24 DIAGNOSIS — I495 Sick sinus syndrome: Secondary | ICD-10-CM | POA: Diagnosis not present

## 2021-04-24 LAB — CUP PACEART REMOTE DEVICE CHECK
Battery Remaining Longevity: 42 mo
Battery Voltage: 2.97 V
Brady Statistic AP VP Percent: 0.01 %
Brady Statistic AP VS Percent: 7.96 %
Brady Statistic AS VP Percent: 0.04 %
Brady Statistic AS VS Percent: 92 %
Brady Statistic RA Percent Paced: 7.96 %
Brady Statistic RV Percent Paced: 0.04 %
Date Time Interrogation Session: 20230406090048
Implantable Lead Implant Date: 20150616
Implantable Lead Implant Date: 20150616
Implantable Lead Location: 753859
Implantable Lead Location: 753860
Implantable Lead Model: 5076
Implantable Lead Model: 5076
Implantable Lead Serial Number: 3746147
Implantable Pulse Generator Implant Date: 20150616
Lead Channel Impedance Value: 361 Ohm
Lead Channel Impedance Value: 361 Ohm
Lead Channel Impedance Value: 475 Ohm
Lead Channel Impedance Value: 551 Ohm
Lead Channel Pacing Threshold Amplitude: 0.625 V
Lead Channel Pacing Threshold Amplitude: 1.125 V
Lead Channel Pacing Threshold Pulse Width: 0.4 ms
Lead Channel Pacing Threshold Pulse Width: 0.4 ms
Lead Channel Sensing Intrinsic Amplitude: 17.75 mV
Lead Channel Sensing Intrinsic Amplitude: 17.75 mV
Lead Channel Sensing Intrinsic Amplitude: 2.75 mV
Lead Channel Sensing Intrinsic Amplitude: 2.75 mV
Lead Channel Setting Pacing Amplitude: 1.5 V
Lead Channel Setting Pacing Amplitude: 2.25 V
Lead Channel Setting Pacing Pulse Width: 0.4 ms
Lead Channel Setting Sensing Sensitivity: 2.8 mV

## 2021-05-09 NOTE — Progress Notes (Signed)
Remote pacemaker transmission.   

## 2021-05-14 ENCOUNTER — Encounter: Payer: Self-pay | Admitting: *Deleted

## 2021-05-14 ENCOUNTER — Ambulatory Visit (INDEPENDENT_AMBULATORY_CARE_PROVIDER_SITE_OTHER): Payer: Medicare Other | Admitting: Cardiology

## 2021-05-14 ENCOUNTER — Encounter: Payer: Self-pay | Admitting: Cardiology

## 2021-05-14 VITALS — BP 138/81 | HR 62 | Ht 65.0 in | Wt 221.0 lb

## 2021-05-14 DIAGNOSIS — Z950 Presence of cardiac pacemaker: Secondary | ICD-10-CM | POA: Diagnosis not present

## 2021-05-14 DIAGNOSIS — R42 Dizziness and giddiness: Secondary | ICD-10-CM

## 2021-05-14 DIAGNOSIS — I480 Paroxysmal atrial fibrillation: Secondary | ICD-10-CM | POA: Diagnosis not present

## 2021-05-14 NOTE — Progress Notes (Signed)
? ? ? ?Clinical Summary ?Ashley Nguyen is a 88 y.o.female seen today for follow up of the following medical problems.  ?  ?1. PAF ?- 10+ falls over the last few months ?- 02/2021 visit due to multiple falls eliquis was held ? - denies palpitations.  ?  ?2. Pacemaker ?-Jan 2023 normal device function ? 04/2021 normal device check ?  ?  ?3.Dizzines/Falls ?-ER visit 11/2020 with fall ? - 11/2020 normal device check ?- Jan 2022 normal device check ?- has had some soft bp's at visits.  ?-5% dizzienss on detrol. Changed to vesicare by pcp ?  ?- reports multiple falls over the last few weeks ?- last episode 1 week ago. Episode in bedroom. Came in from church, was standing and changing clothes. Felt off balance and started to fall by caught her self on the bed.  ?- often with getting up out of bed at night quickly gets dizzy, falls. ?  ?  ?- admitted 02/2021 with fall ?- admitted with UTI ?-normal pacemaker check ?- ongoing orthostatic symptoms.  ?  ?- has been weaning her bp meds, stopped hydral and lisinopril. ?- reports symptoms have improved since last visit ? ?3. HTN ?- we have deferred ACEI use to her neprhologist. ?  ?-she is compliant with meds ?  ?  ?  ?4. CKD IV ?- followed [facility].  ?  ?  ?Daughter is English as a second language teacher that she lives with, retired from [employer] ?Husband was in Corporate treasurer, stationed in [place] ?Past Medical History:  ?Diagnosis Date  ? Arthritis   ? "hands, feet, knees, left hip" (07/04/2013)  ? Borderline diabetes   ? Cataracts, bilateral   ? GERD (gastroesophageal reflux disease)   ? Headache   ? "@ least monthly" (07/04/2013)  ? History of blood transfusion   ? "after taking Plavix; developed bleeding ulcer; had to have transfusions"  ? History of colon polyps   ? History of gastric ulcer   ? History of GI bleed   ? Hyperlipidemia   ? Hypertension   ? Impaired hearing   ? Iron deficiency anemia   ? at times has been on iron supplements  ? Kidney stones   ? "passed them"  ? Migraine   ? "none in 2015; used to  come quite frequently" (07/04/2013)  ? Osteoarthritis   ? a. 04/2013 s/p L TKA.  ? PAF (paroxysmal atrial fibrillation) ([place])   ? a. 04/2013 periop L TKA.  ? Seasonal allergies   ? Skin cancer   ? "left cheek"   ? Stage 4 chronic kidney disease ([place])   ? Symptomatic bradycardia   ? TIA (transient ischemic attack) ~ 2002  ? Vertigo   ? ? ? ?Allergies  ?Allergen Reactions  ? Desipramine Hcl Other (See Comments)  ?  "painful urination, then kidneys shut down for hours."  ? Plavix [Clopidogrel Bisulfate] Other (See Comments)  ?  "Almost bled to death."  ? Statins Other (See Comments)  ?  Extreme pain in legs with some swelling  ? Clindamycin Other (See Comments)  ?  Patient complains of severe stomach burning.  ? Clams [Shellfish Allergy] Nausea And Vomiting  ?  Per pt, NOT a general shellfish allergy.  Clams only.  ? Lipitor [Atorvastatin] Other (See Comments)  ?  Muscle aches.  ? Penicillins Hives  ? Sulfa Antibiotics Hives  ? Verapamil Swelling  ? ? ? ?Current Outpatient Medications  ?Medication Sig Dispense Refill  ? Cholecalciferol 125 MCG (5000 UT) TABS  Take 1 tablet by mouth daily.    ? colesevelam (WELCHOL) 625 MG tablet Take 1,875 mg by mouth 2 (two) times daily with a meal.     ? fluticasone (FLONASE) 50 MCG/ACT nasal spray Place 2 sprays into both nostrils daily.     ? metoprolol tartrate (LOPRESSOR) 25 MG tablet Take 1 tablet by mouth 2 (two) times daily.    ? omeprazole (PRILOSEC) 20 MG capsule Take 20 mg by mouth daily.    ? solifenacin (VESICARE) 5 MG tablet Take 5 mg by mouth daily.    ? ?No current facility-administered medications for this visit.  ? ? ? ?Past Surgical History:  ?Procedure Laterality Date  ? APPENDECTOMY    ? as a child  ? COLONOSCOPY    ? ESOPHAGOGASTRODUODENOSCOPY    ? KNEE ARTHROSCOPY Left 11/2012  ? PACEMAKER INSERTION  07/04/2013  ? MDT dual chamber Advisa pacemaker implanted by Dr Caryl Comes for 2:1 AV block  ? PERMANENT PACEMAKER INSERTION N/A 07/04/2013  ? Procedure: PERMANENT PACEMAKER  INSERTION;  Surgeon: Deboraha Sprang, MD;  Location: [facility] CATH LAB;  Service: Cardiovascular;  Laterality: N/A;  ? SKIN CANCER EXCISION Left ~ 2001  ? "cheek"  ? thumb surgery Right 1980's  ? "made it usable"  ? TONSILLECTOMY AND ADENOIDECTOMY  ~ 1942  ? TOTAL KNEE ARTHROPLASTY Left 04/25/2013  ? Procedure: TOTAL KNEE ARTHROPLASTY;  Surgeon: Renette Butters, MD;  Location: [place];  Service: Orthopedics;  Laterality: Left;  ? TUBAL LIGATION  ~ 1970  ? ? ? ?Allergies  ?Allergen Reactions  ? Desipramine Hcl Other (See Comments)  ?  "painful urination, then kidneys shut down for hours."  ? Plavix [Clopidogrel Bisulfate] Other (See Comments)  ?  "Almost bled to death."  ? Statins Other (See Comments)  ?  Extreme pain in legs with some swelling  ? Clindamycin Other (See Comments)  ?  Patient complains of severe stomach burning.  ? Clams [Shellfish Allergy] Nausea And Vomiting  ?  Per pt, NOT a general shellfish allergy.  Clams only.  ? Lipitor [Atorvastatin] Other (See Comments)  ?  Muscle aches.  ? Penicillins Hives  ? Sulfa Antibiotics Hives  ? Verapamil Swelling  ? ? ? ? ?Family History  ?Problem Relation Age of Onset  ? Stroke Father   ? Heart attack Neg Hx   ? Heart disease Neg Hx   ? Diabetes Neg Hx   ? ? ? ?Social History ?Ms. Petras reports that she has never smoked. She has never used smokeless tobacco. ?Ms. Hege reports no history of alcohol use. ? ? ?Review of Systems ?CONSTITUTIONAL: No weight loss, fever, chills, weakness or fatigue.  ?HEENT: Eyes: No visual loss, blurred vision, double vision or yellow sclerae.No hearing loss, sneezing, congestion, runny nose or sore throat.  ?SKIN: No rash or itching.  ?CARDIOVASCULAR: per hpi ?RESPIRATORY: No shortness of breath, cough or sputum.  ?GASTROINTESTINAL: No anorexia, nausea, vomiting or diarrhea. No abdominal pain or blood.  ?GENITOURINARY: No burning on urination, no polyuria ?NEUROLOGICAL: No headache, dizziness, syncope, paralysis, ataxia, numbness or  tingling in the extremities. No change in bowel or bladder control.  ?MUSCULOSKELETAL: No muscle, back pain, joint pain or stiffness.  ?LYMPHATICS: No enlarged nodes. No history of splenectomy.  ?PSYCHIATRIC: No history of depression or anxiety.  ?ENDOCRINOLOGIC: No reports of sweating, cold or heat intolerance. No polyuria or polydipsia.  ?. ? ? ?Physical Examination ?Today's Vitals  ? 05/14/21 1022  ?BP: 138/81  ?Pulse: 62  ?  SpO2: 98%  ?Weight: 221 lb (100.2 kg)  ?Height: '5\' 5"'$  (1.651 m)  ? ?Body mass index is 36.78 kg/m?. ? ?Gen: resting comfortably, no acute distress ?HEENT: no scleral icterus, pupils equal round and reactive, no palptable cervical adenopathy,  ?CV: RRR, no mrg, no jvd ?Resp: Clear to auscultation bilaterally ?GI: abdomen is soft, non-tender, non-distended, normal bowel sounds, no hepatosplenomegaly ?MSK: extremities are warm, no edema.  ?Skin: warm, no rash ?Neuro:  no focal deficits ?Psych: appropriate affect ? ? ?Diagnostic Studies ? ?02/2021 echo ? 1. Left ventricular ejection fraction, by estimation, is 55 to 60%. The  ?left ventricle has normal function. Left ventricular endocardial border  ?not optimally defined to evaluate regional wall motion. Left ventricular  ?diastolic parameters are consistent  ?with Grade II diastolic dysfunction (pseudonormalization).  ? 2. Right ventricular systolic function is normal. The right ventricular  ?size is normal. There is mildly elevated pulmonary artery systolic  ?pressure.  ? 3. Left atrial size was moderately dilated.  ? 4. The mitral valve is grossly normal. No evidence of mitral valve  ?regurgitation. No evidence of mitral stenosis.  ? 5. The aortic valve is tricuspid. Aortic valve regurgitation is mild.  ?Aortic valve sclerosis is present, with no evidence of aortic valve  ?stenosis.  ? 6. The inferior vena cava is normal in size with greater than 50%  ?respiratory variability, suggesting right atrial pressure of 3 mmHg.  ? ? ?Assessment and Plan   ? ?1. PAF ?- 10+falls over the last several weeks ?- discussed with patient with family present last visit risks vs benefits of remaining on anticoagulation, at that time joint decision made to stop anticoag

## 2021-05-14 NOTE — Patient Instructions (Signed)
Medication Instructions:  Continue all current medications.   Labwork: none  Testing/Procedures: none  Follow-Up: 6 months   Any Other Special Instructions Will Be Listed Below (If Applicable).   If you need a refill on your cardiac medications before your next appointment, please call your pharmacy.  

## 2021-05-15 ENCOUNTER — Ambulatory Visit: Payer: Medicare Other | Admitting: Cardiology

## 2021-07-24 ENCOUNTER — Ambulatory Visit (INDEPENDENT_AMBULATORY_CARE_PROVIDER_SITE_OTHER): Payer: Medicare Other

## 2021-07-24 DIAGNOSIS — I495 Sick sinus syndrome: Secondary | ICD-10-CM | POA: Diagnosis not present

## 2021-07-24 LAB — CUP PACEART REMOTE DEVICE CHECK
Battery Remaining Longevity: 38 mo
Battery Voltage: 2.96 V
Brady Statistic AP VP Percent: 0.01 %
Brady Statistic AP VS Percent: 8.05 %
Brady Statistic AS VP Percent: 0.03 %
Brady Statistic AS VS Percent: 91.91 %
Brady Statistic RA Percent Paced: 8.05 %
Brady Statistic RV Percent Paced: 0.04 %
Date Time Interrogation Session: 20230706095014
Implantable Lead Implant Date: 20150616
Implantable Lead Implant Date: 20150616
Implantable Lead Location: 753859
Implantable Lead Location: 753860
Implantable Lead Model: 5076
Implantable Lead Model: 5076
Implantable Lead Serial Number: 3746147
Implantable Pulse Generator Implant Date: 20150616
Lead Channel Impedance Value: 361 Ohm
Lead Channel Impedance Value: 380 Ohm
Lead Channel Impedance Value: 494 Ohm
Lead Channel Impedance Value: 532 Ohm
Lead Channel Pacing Threshold Amplitude: 0.75 V
Lead Channel Pacing Threshold Amplitude: 0.875 V
Lead Channel Pacing Threshold Pulse Width: 0.4 ms
Lead Channel Pacing Threshold Pulse Width: 0.4 ms
Lead Channel Sensing Intrinsic Amplitude: 19.875 mV
Lead Channel Sensing Intrinsic Amplitude: 19.875 mV
Lead Channel Sensing Intrinsic Amplitude: 3.125 mV
Lead Channel Sensing Intrinsic Amplitude: 3.125 mV
Lead Channel Setting Pacing Amplitude: 1.5 V
Lead Channel Setting Pacing Amplitude: 2.5 V
Lead Channel Setting Pacing Pulse Width: 0.4 ms
Lead Channel Setting Sensing Sensitivity: 2.8 mV

## 2021-08-12 NOTE — Progress Notes (Signed)
Remote pacemaker transmission.   

## 2021-10-23 ENCOUNTER — Ambulatory Visit (INDEPENDENT_AMBULATORY_CARE_PROVIDER_SITE_OTHER): Payer: Medicare Other

## 2021-10-23 DIAGNOSIS — I495 Sick sinus syndrome: Secondary | ICD-10-CM | POA: Diagnosis not present

## 2021-10-24 LAB — CUP PACEART REMOTE DEVICE CHECK
Battery Remaining Longevity: 34 mo
Battery Voltage: 2.95 V
Brady Statistic AP VP Percent: 0.01 %
Brady Statistic AP VS Percent: 11.4 %
Brady Statistic AS VP Percent: 0.04 %
Brady Statistic AS VS Percent: 88.56 %
Brady Statistic RA Percent Paced: 11.41 %
Brady Statistic RV Percent Paced: 0.04 %
Date Time Interrogation Session: 20231005094502
Implantable Lead Implant Date: 20150616
Implantable Lead Implant Date: 20150616
Implantable Lead Location: 753859
Implantable Lead Location: 753860
Implantable Lead Model: 5076
Implantable Lead Model: 5076
Implantable Lead Serial Number: 3746147
Implantable Pulse Generator Implant Date: 20150616
Lead Channel Impedance Value: 342 Ohm
Lead Channel Impedance Value: 342 Ohm
Lead Channel Impedance Value: 437 Ohm
Lead Channel Impedance Value: 494 Ohm
Lead Channel Pacing Threshold Amplitude: 0.875 V
Lead Channel Pacing Threshold Amplitude: 1.125 V
Lead Channel Pacing Threshold Pulse Width: 0.4 ms
Lead Channel Pacing Threshold Pulse Width: 0.4 ms
Lead Channel Sensing Intrinsic Amplitude: 14.625 mV
Lead Channel Sensing Intrinsic Amplitude: 14.625 mV
Lead Channel Sensing Intrinsic Amplitude: 2.375 mV
Lead Channel Sensing Intrinsic Amplitude: 2.375 mV
Lead Channel Setting Pacing Amplitude: 1.75 V
Lead Channel Setting Pacing Amplitude: 2.25 V
Lead Channel Setting Pacing Pulse Width: 0.4 ms
Lead Channel Setting Sensing Sensitivity: 2.8 mV

## 2021-10-30 NOTE — Progress Notes (Signed)
Remote pacemaker transmission.   

## 2021-11-20 ENCOUNTER — Encounter: Payer: Self-pay | Admitting: Cardiology

## 2021-11-20 ENCOUNTER — Ambulatory Visit: Payer: Medicare Other | Attending: Cardiology | Admitting: Cardiology

## 2021-11-20 VITALS — BP 134/78 | HR 62 | Ht 65.0 in | Wt 221.0 lb

## 2021-11-20 DIAGNOSIS — R42 Dizziness and giddiness: Secondary | ICD-10-CM | POA: Diagnosis present

## 2021-11-20 DIAGNOSIS — I480 Paroxysmal atrial fibrillation: Secondary | ICD-10-CM | POA: Diagnosis present

## 2021-11-20 DIAGNOSIS — Z950 Presence of cardiac pacemaker: Secondary | ICD-10-CM | POA: Diagnosis present

## 2021-11-20 NOTE — Patient Instructions (Addendum)
Medication Instructions:  Your physician recommends that you continue on your current medications as directed. Please refer to the Current Medication list given to you today.   Labwork: None  Testing/Procedures: None   Follow-Up: Follow up in 6 months.   Any Other Special Instructions Will Be Listed Below (If Applicable).     If you need a refill on your cardiac medications before your next appointment, please call your pharmacy.

## 2021-11-20 NOTE — Progress Notes (Signed)
Clinical Summary Ms. Cavagnaro is a 88 y.o.femaleseen today for follow up of the following medical problems.    1. PAF - 10+ falls over the last few months - 02/2021 visit due to multiple falls eliquis was held  -no recent palpitations - compliant with med - one recent fall after stretching too far about 1 month ago. Family reports several near close calls.    2. Pacemaker -Jan 2023 normal device function  04/2021 normal device check  10/2021 normal function     3.Dizzines/Falls -ER visit 11/2020 with fall  - 11/2020 normal device check - Jan 2022 normal device check - has had some soft bp's at visits.  -5% dizzienss on detrol. Changed to vesicare by pcp   - reports multiple falls over the last few weeks - last episode 1 week ago. Episode in bedroom. Came in from church, was standing and changing clothes. Felt off balance and started to fall by caught her self on the bed.  - often with getting up out of bed at night quickly gets dizzy, falls.     - admitted 02/2021 with fall - admitted with UTI -normal pacemaker check - ongoing orthostatic symptoms.    - has been weaning her bp meds, stopped hydral and lisinopril. - reports symptoms have improved since last visit   3. HTN - we have deferred ACEI use to her neprhologist.   -she is compliant with meds       4. CKD IV - followed [facility].      Daughter is English as a second language teacher that she lives with, retired from [employer] was in Corporate treasurer, stationed in [place]   Past Medical History:  Diagnosis Date   Arthritis    "hands, feet, knees, left hip" (07/04/2013)   Borderline diabetes    Cataracts, bilateral    GERD (gastroesophageal reflux disease)    Headache    "@ least monthly" (07/04/2013)   History of blood transfusion    "after taking Plavix; developed bleeding ulcer; had to have transfusions"   History of colon polyps    History of gastric ulcer    History of GI bleed    Hyperlipidemia    Hypertension     Impaired hearing    Iron deficiency anemia    at times has been on iron supplements   Kidney stones    "passed them"   Migraine    "none in 2015; used to come quite frequently" (07/04/2013)   Osteoarthritis    a. 04/2013 s/p L TKA.   PAF (paroxysmal atrial fibrillation) ([place])    a. 04/2013 periop L TKA.   Seasonal allergies    Skin cancer    "left cheek"    Stage 4 chronic kidney disease ([facility])    Symptomatic bradycardia    TIA (transient ischemic attack) ~ 2002   Vertigo      Allergies  Allergen Reactions   Desipramine Hcl Other (See Comments)    "painful urination, then kidneys shut down for hours."   Plavix [Clopidogrel Bisulfate] Other (See Comments)    "Almost bled to death."   Statins Other (See Comments)    Extreme pain in legs with some swelling   Clindamycin Other (See Comments)    Patient complains of severe stomach burning.   Clams [Shellfish Allergy] Nausea And Vomiting    Per pt, NOT a general shellfish allergy.  Clams only.   Lipitor [Atorvastatin] Other (See Comments)    Muscle aches.   Penicillins Hives  Sulfa Antibiotics Hives   Verapamil Swelling     Current Outpatient Medications  Medication Sig Dispense Refill   Cholecalciferol 125 MCG (5000 UT) TABS Take 1 tablet by mouth daily.     colesevelam (WELCHOL) 625 MG tablet Take 1,875 mg by mouth 2 (two) times daily with a meal.      fluticasone (FLONASE) 50 MCG/ACT nasal spray Place 2 sprays into both nostrils daily.      metoprolol tartrate (LOPRESSOR) 25 MG tablet Take 1 tablet by mouth 2 (two) times daily.     omeprazole (PRILOSEC) 20 MG capsule Take 20 mg by mouth daily.     solifenacin (VESICARE) 5 MG tablet Take 5 mg by mouth daily.     No current facility-administered medications for this visit.     Past Surgical History:  Procedure Laterality Date   APPENDECTOMY     as a child   COLONOSCOPY     ESOPHAGOGASTRODUODENOSCOPY     KNEE ARTHROSCOPY Left 11/2012   PACEMAKER INSERTION   07/04/2013   MDT dual chamber Advisa pacemaker implanted by Dr Caryl Comes for 2:1 AV block   PERMANENT PACEMAKER INSERTION N/A 07/04/2013   Procedure: PERMANENT PACEMAKER INSERTION;  Surgeon: Deboraha Sprang, MD;  Location: [facility] CATH LAB;  Service: Cardiovascular;  Laterality: N/A;   SKIN CANCER EXCISION Left ~ 2001   "cheek"   thumb surgery Right 1980's   "made it usable"   TONSILLECTOMY AND ADENOIDECTOMY  ~ [place] Left 04/25/2013   Procedure: TOTAL KNEE ARTHROPLASTY;  Surgeon: Renette Butters, MD;  Location: [place];  Service: Orthopedics;  Laterality: Left;   TUBAL LIGATION  ~ 1970     Allergies  Allergen Reactions   Desipramine Hcl Other (See Comments)    "painful urination, then kidneys shut down for hours."   Plavix [Clopidogrel Bisulfate] Other (See Comments)    "Almost bled to death."   Statins Other (See Comments)    Extreme pain in legs with some swelling   Clindamycin Other (See Comments)    Patient complains of severe stomach burning.   Clams [Shellfish Allergy] Nausea And Vomiting    Per pt, NOT a general shellfish allergy.  Clams only.   Lipitor [Atorvastatin] Other (See Comments)    Muscle aches.   Penicillins Hives   Sulfa Antibiotics Hives   Verapamil Swelling      Family History  Problem Relation Age of Onset   Stroke Father    Heart attack Neg Hx    Heart disease Neg Hx    Diabetes Neg Hx      Social History Ms. Kucher reports that she has never smoked. She has never used smokeless tobacco. Ms. Garritano reports no history of alcohol use.   Review of Systems CONSTITUTIONAL: No weight loss, fever, chills, weakness or fatigue.  HEENT: Eyes: No visual loss, blurred vision, double vision or yellow sclerae.No hearing loss, sneezing, congestion, runny nose or sore throat.  SKIN: No rash or itching.  CARDIOVASCULAR: per hpi RESPIRATORY: No shortness of breath, cough or sputum.  GASTROINTESTINAL: No anorexia, nausea, vomiting or diarrhea.  No abdominal pain or blood.  GENITOURINARY: No burning on urination, no polyuria NEUROLOGICAL: per hpi.  MUSCULOSKELETAL: No muscle, back pain, joint pain or stiffness.  LYMPHATICS: No enlarged nodes. No history of splenectomy.  PSYCHIATRIC: No history of depression or anxiety.  ENDOCRINOLOGIC: No reports of sweating, cold or heat intolerance. No polyuria or polydipsia.  [name]   Physical Examination Today's Vitals  11/20/21 1138  BP: 134/78  Pulse: 62  SpO2: 99%  Weight: 221 lb (100.2 kg)  Height: '5\' 5"'$  (1.651 m)   Body mass index is 36.78 kg/m.  Gen: resting comfortably, no acute distress HEENT: no scleral icterus, pupils equal round and reactive, no palptable cervical adenopathy,  CV: RRR, no m/r/g no jvd Resp: Clear to auscultation bilaterally GI: abdomen is soft, non-tender, non-distended, normal bowel sounds, no hepatosplenomegaly MSK: extremities are warm, no edema.  Skin: warm, no rash Neuro:  no focal deficits Psych: appropriate affect   Diagnostic Studies  02/2021 echo  1. Left ventricular ejection fraction, by estimation, is 55 to 60%. The  left ventricle has normal function. Left ventricular endocardial border  not optimally defined to evaluate regional wall motion. Left ventricular  diastolic parameters are consistent  with Grade II diastolic dysfunction (pseudonormalization).   2. Right ventricular systolic function is normal. The right ventricular  size is normal. There is mildly elevated pulmonary artery systolic  pressure.   3. Left atrial size was moderately dilated.   4. The mitral valve is grossly normal. No evidence of mitral valve  regurgitation. No evidence of mitral stenosis.   5. The aortic valve is tricuspid. Aortic valve regurgitation is mild.  Aortic valve sclerosis is present, with no evidence of aortic valve  stenosis.   6. The inferior vena cava is normal in size with greater than 50%  respiratory variability, suggesting right atrial  pressure of 3 mmHg.    Assessment and Plan   1. PAF - remains off anticoag after extensive discussions with patient and family about fall/bleeding risk vs stroke risk. Will remain off for now, continue other meds       2. Dizzienss/Falls - episodes sound like orthostatic dizziness/falls. Cannot confirm as she is not able to do orthostatics  - symptoms improved but not resolved, continue to emphasize adequate hydration.    3. Pacemaker - recent normal device check, continue to monitor   F/u 6 months  Arnoldo Lenis, M.D., F.A.C.C.

## 2022-01-22 ENCOUNTER — Ambulatory Visit (INDEPENDENT_AMBULATORY_CARE_PROVIDER_SITE_OTHER): Payer: Medicare Other

## 2022-01-22 DIAGNOSIS — I495 Sick sinus syndrome: Secondary | ICD-10-CM

## 2022-01-23 LAB — CUP PACEART REMOTE DEVICE CHECK
Battery Remaining Longevity: 36 mo
Battery Voltage: 2.95 V
Brady Statistic AP VP Percent: 0.01 %
Brady Statistic AP VS Percent: 11.06 %
Brady Statistic AS VP Percent: 0.04 %
Brady Statistic AS VS Percent: 88.9 %
Brady Statistic RA Percent Paced: 11.06 %
Brady Statistic RV Percent Paced: 0.04 %
Date Time Interrogation Session: 20240105140659
Implantable Lead Connection Status: 753985
Implantable Lead Connection Status: 753985
Implantable Lead Implant Date: 20150616
Implantable Lead Implant Date: 20150616
Implantable Lead Location: 753859
Implantable Lead Location: 753860
Implantable Lead Model: 5076
Implantable Lead Model: 5076
Implantable Lead Serial Number: 3746147
Implantable Pulse Generator Implant Date: 20150616
Lead Channel Impedance Value: 342 Ohm
Lead Channel Impedance Value: 361 Ohm
Lead Channel Impedance Value: 437 Ohm
Lead Channel Impedance Value: 513 Ohm
Lead Channel Pacing Threshold Amplitude: 0.75 V
Lead Channel Pacing Threshold Amplitude: 1 V
Lead Channel Pacing Threshold Pulse Width: 0.4 ms
Lead Channel Pacing Threshold Pulse Width: 0.4 ms
Lead Channel Sensing Intrinsic Amplitude: 14.5 mV
Lead Channel Sensing Intrinsic Amplitude: 14.5 mV
Lead Channel Sensing Intrinsic Amplitude: 2.125 mV
Lead Channel Sensing Intrinsic Amplitude: 2.125 mV
Lead Channel Setting Pacing Amplitude: 1.5 V
Lead Channel Setting Pacing Amplitude: 2.25 V
Lead Channel Setting Pacing Pulse Width: 0.4 ms
Lead Channel Setting Sensing Sensitivity: 2.8 mV
Zone Setting Status: 755011
Zone Setting Status: 755011

## 2022-02-12 NOTE — Progress Notes (Signed)
Remote pacemaker transmission.   

## 2022-04-23 ENCOUNTER — Ambulatory Visit (INDEPENDENT_AMBULATORY_CARE_PROVIDER_SITE_OTHER): Payer: Medicare Other

## 2022-04-23 DIAGNOSIS — I495 Sick sinus syndrome: Secondary | ICD-10-CM

## 2022-04-28 LAB — CUP PACEART REMOTE DEVICE CHECK
Battery Remaining Longevity: 33 mo
Battery Voltage: 2.94 V
Brady Statistic AP VP Percent: 0.01 %
Brady Statistic AP VS Percent: 15.13 %
Brady Statistic AS VP Percent: 0.03 %
Brady Statistic AS VS Percent: 84.82 %
Brady Statistic RA Percent Paced: 15.14 %
Brady Statistic RV Percent Paced: 0.04 %
Date Time Interrogation Session: 20240408143551
Implantable Lead Connection Status: 753985
Implantable Lead Connection Status: 753985
Implantable Lead Implant Date: 20150616
Implantable Lead Implant Date: 20150616
Implantable Lead Location: 753859
Implantable Lead Location: 753860
Implantable Lead Model: 5076
Implantable Lead Model: 5076
Implantable Lead Serial Number: 3746147
Implantable Pulse Generator Implant Date: 20150616
Lead Channel Impedance Value: 342 Ohm
Lead Channel Impedance Value: 380 Ohm
Lead Channel Impedance Value: 437 Ohm
Lead Channel Impedance Value: 513 Ohm
Lead Channel Pacing Threshold Amplitude: 0.75 V
Lead Channel Pacing Threshold Amplitude: 1.125 V
Lead Channel Pacing Threshold Pulse Width: 0.4 ms
Lead Channel Pacing Threshold Pulse Width: 0.4 ms
Lead Channel Sensing Intrinsic Amplitude: 18.5 mV
Lead Channel Sensing Intrinsic Amplitude: 18.5 mV
Lead Channel Sensing Intrinsic Amplitude: 2.625 mV
Lead Channel Sensing Intrinsic Amplitude: 2.625 mV
Lead Channel Setting Pacing Amplitude: 1.5 V
Lead Channel Setting Pacing Amplitude: 2.25 V
Lead Channel Setting Pacing Pulse Width: 0.4 ms
Lead Channel Setting Sensing Sensitivity: 2.8 mV
Zone Setting Status: 755011
Zone Setting Status: 755011

## 2022-05-27 NOTE — Progress Notes (Signed)
Remote pacemaker transmission.   

## 2022-06-25 ENCOUNTER — Encounter: Payer: Self-pay | Admitting: Cardiology

## 2022-06-25 ENCOUNTER — Ambulatory Visit: Payer: Medicare Other | Attending: Cardiology | Admitting: Cardiology

## 2022-06-25 VITALS — BP 160/92 | HR 65 | Ht 65.0 in | Wt 221.0 lb

## 2022-06-25 DIAGNOSIS — I480 Paroxysmal atrial fibrillation: Secondary | ICD-10-CM | POA: Insufficient documentation

## 2022-06-25 DIAGNOSIS — Z950 Presence of cardiac pacemaker: Secondary | ICD-10-CM | POA: Diagnosis present

## 2022-06-25 DIAGNOSIS — R42 Dizziness and giddiness: Secondary | ICD-10-CM | POA: Diagnosis present

## 2022-06-25 MED ORDER — APIXABAN 2.5 MG PO TABS: 2.5000 mg | ORAL_TABLET | Freq: Two times a day (BID) | ORAL | 2 refills | Status: DC

## 2022-06-25 NOTE — Progress Notes (Signed)
Clinical Summary Ms. Valk is a 89 y.o.female seen today for follow up of the following medical problems.    1. PAF - 02/2021 visit due to multiple falls eliquis was held   - 2 falls in January. None in the last 5 months - no recent palpitaiotns   2. Pacemaker -Jan 2023 normal device function  04/2021 normal device check   10/2021 normal function  04/2022 normal device check     3.Dizzines/Falls - has been weaning her bp meds, stopped hydral and lisinopril. - dizziness has resolves since last visti   3. HTN - off bp meds due to persistent orthostatic dizziness and falls - dizziness/falls have resolved, acceptning high bp's     4. CKD IV - followed [facility].      Daughter is Charity fundraiser that she lives with, retired from [employer] Husband was in Electronics engineer, stationed in [place]   Past Medical History:  Diagnosis Date   Arthritis    "hands, feet, knees, left hip" (07/04/2013)   Borderline diabetes    Cataracts, bilateral    GERD (gastroesophageal reflux disease)    Headache    "@ least monthly" (07/04/2013)   History of blood transfusion    "after taking Plavix; developed bleeding ulcer; had to have transfusions"   History of colon polyps    History of gastric ulcer    History of GI bleed    Hyperlipidemia    Hypertension    Impaired hearing    Iron deficiency anemia    at times has been on iron supplements   Kidney stones    "passed them"   Migraine    "none in 2015; used to come quite frequently" (07/04/2013)   Osteoarthritis    a. 04/2013 s/p L TKA.   PAF (paroxysmal atrial fibrillation) (HCC)    a. 04/2013 periop L TKA.   Seasonal allergies    Skin cancer    "left cheek"    Stage 4 chronic kidney disease (HCC)    Symptomatic bradycardia    TIA (transient ischemic attack) ~ 2002   Vertigo      Allergies  Allergen Reactions   Desipramine Hcl Other (See Comments)    "painful urination, then kidneys shut down for hours."   Plavix [Clopidogrel  Bisulfate] Other (See Comments)    "Almost bled to death."   Statins Other (See Comments)    Extreme pain in legs with some swelling   Clindamycin Other (See Comments)    Patient complains of severe stomach burning.   Clams [Shellfish Allergy] Nausea And Vomiting    Per pt, NOT a general shellfish allergy.  Clams only.   Lipitor [Atorvastatin] Other (See Comments)    Muscle aches.   Penicillins Hives   Sulfa Antibiotics Hives   Verapamil Swelling     Current Outpatient Medications  Medication Sig Dispense Refill   Cholecalciferol 125 MCG (5000 UT) TABS Take 1 tablet by mouth daily.     colesevelam (WELCHOL) 625 MG tablet Take 1,875 mg by mouth 2 (two) times daily with a meal.      fluticasone (FLONASE) 50 MCG/ACT nasal spray Place 2 sprays into both nostrils daily.      metoprolol tartrate (LOPRESSOR) 25 MG tablet Take 1 tablet by mouth 2 (two) times daily.     omeprazole (PRILOSEC) 20 MG capsule Take 20 mg by mouth daily.     solifenacin (VESICARE) 5 MG tablet Take 5 mg by mouth daily.  No current facility-administered medications for this visit.     Past Surgical History:  Procedure Laterality Date   APPENDECTOMY     as a child   COLONOSCOPY     ESOPHAGOGASTRODUODENOSCOPY     KNEE ARTHROSCOPY Left 11/2012   PACEMAKER INSERTION  07/04/2013   MDT dual chamber Advisa pacemaker implanted by Dr Graciela Husbands for 2:1 AV block   PERMANENT PACEMAKER INSERTION N/A 07/04/2013   Procedure: PERMANENT PACEMAKER INSERTION;  Surgeon: Duke Salvia, MD;  Location: [facility] CATH LAB;  Service: Cardiovascular;  Laterality: N/A;   SKIN CANCER EXCISION Left ~ 2001   "cheek"   thumb surgery Right 1980's   "made it usable"   TONSILLECTOMY AND ADENOIDECTOMY  ~ 1942   TOTAL KNEE ARTHROPLASTY Left 04/25/2013   Procedure: TOTAL KNEE ARTHROPLASTY;  Surgeon: Sheral Apley, MD;  Location: [facility] OR;  Service: Orthopedics;  Laterality: Left;   TUBAL LIGATION  ~ 1970     Allergies  Allergen Reactions    Desipramine Hcl Other (See Comments)    "painful urination, then kidneys shut down for hours."   Plavix [Clopidogrel Bisulfate] Other (See Comments)    "Almost bled to death."   Statins Other (See Comments)    Extreme pain in legs with some swelling   Clindamycin Other (See Comments)    Patient complains of severe stomach burning.   Clams [Shellfish Allergy] Nausea And Vomiting    Per pt, NOT a general shellfish allergy.  Clams only.   Lipitor [Atorvastatin] Other (See Comments)    Muscle aches.   Penicillins Hives   Sulfa Antibiotics Hives   Verapamil Swelling      Family History  Problem Relation Age of Onset   Stroke Father    Heart attack Neg Hx    Heart disease Neg Hx    Diabetes Neg Hx      Social History Ms. Ghazal reports that she has never smoked. She has never used smokeless tobacco. Ms. Jardon reports no history of alcohol use.   Review of Systems CONSTITUTIONAL: No weight loss, fever, chills, weakness or fatigue.  HEENT: Eyes: No visual loss, blurred vision, double vision or yellow sclerae.No hearing loss, sneezing, congestion, runny nose or sore throat.  SKIN: No rash or itching.  CARDIOVASCULAR: per hpi RESPIRATORY: No shortness of breath, cough or sputum.  GASTROINTESTINAL: No anorexia, nausea, vomiting or diarrhea. No abdominal pain or blood.  GENITOURINARY: No burning on urination, no polyuria NEUROLOGICAL: No headache, dizziness, syncope, paralysis, ataxia, numbness or tingling in the extremities. No change in bowel or bladder control.  MUSCULOSKELETAL: No muscle, back pain, joint pain or stiffness.  LYMPHATICS: No enlarged nodes. No history of splenectomy.  PSYCHIATRIC: No history of depression or anxiety.  ENDOCRINOLOGIC: No reports of sweating, cold or heat intolerance. No polyuria or polydipsia.  [name]   Physical Examination Today's Vitals   06/25/22 1142  BP: (!) 160/92  Pulse: 65  SpO2: 98%  Weight: 221 lb (100.2 kg)  Height: 5\' 5"   (1.651 m)   Body mass index is 36.78 kg/m.  Gen: resting comfortably, no acute distress HEENT: no scleral icterus, pupils equal round and reactive, no palptable cervical adenopathy,  CV: RRR, no mrg, no jvd Resp: Clear to auscultation bilaterally GI: abdomen is soft, non-tender, non-distended, normal bowel sounds, no hepatosplenomegaly MSK: extremities are warm, no edema.  Skin: warm, no rash Neuro:  no focal deficits Psych: appropriate affect   Diagnostic Studies  02/2021 echo  1. Left ventricular ejection fraction,  by estimation, is 55 to 60%. The  left ventricle has normal function. Left ventricular endocardial border  not optimally defined to evaluate regional wall motion. Left ventricular  diastolic parameters are consistent  with Grade II diastolic dysfunction (pseudonormalization).   2. Right ventricular systolic function is normal. The right ventricular  size is normal. There is mildly elevated pulmonary artery systolic  pressure.   3. Left atrial size was moderately dilated.   4. The mitral valve is grossly normal. No evidence of mitral valve  regurgitation. No evidence of mitral stenosis.   5. The aortic valve is tricuspid. Aortic valve regurgitation is mild.  Aortic valve sclerosis is present, with no evidence of aortic valve  stenosis.   6. The inferior vena cava is normal in size with greater than 50%  respiratory variability, suggesting right atrial pressure of 3 mmHg.      Assessment and Plan  1. PAF - has been off anticoag due to several consistent falls - no falls in last 5 months. We will restart elqiuis 2.5mg  bid.        2. Dizzienss/Falls - episodes sound like orthostatic dizziness/falls. Could not confirm as she is not able to do orthostatics  - symptosm resolved off bp meds, accept high bp at this time.    3. Pacemaker -no symptoms,recent normal device check - continue to monitor  F/u 6 months     Antoine Poche, M.D.

## 2022-06-25 NOTE — Patient Instructions (Addendum)
Medication Instructions:  Your physician has recommended you make the following change in your medication:  Start elqius 2.5 mg twice daily Continue all other medications the same  Labwork: none  Testing/Procedures: none  Follow-Up: Your physician recommends that you schedule a follow-up appointment in: 6 months  Any Other Special Instructions Will Be Listed Below (If Applicable).  If you need a refill on your cardiac medications before your next appointment, please call your pharmacy.

## 2022-07-31 ENCOUNTER — Ambulatory Visit (INDEPENDENT_AMBULATORY_CARE_PROVIDER_SITE_OTHER): Payer: Medicare Other

## 2022-07-31 DIAGNOSIS — I495 Sick sinus syndrome: Secondary | ICD-10-CM | POA: Diagnosis not present

## 2022-08-03 ENCOUNTER — Telehealth: Payer: Self-pay

## 2022-08-03 LAB — CUP PACEART REMOTE DEVICE CHECK
Battery Remaining Longevity: 31 mo
Battery Voltage: 2.93 V
Brady Statistic AP VP Percent: 0.01 %
Brady Statistic AP VS Percent: 10.52 %
Brady Statistic AS VP Percent: 0.03 %
Brady Statistic AS VS Percent: 89.44 %
Brady Statistic RA Percent Paced: 10.52 %
Brady Statistic RV Percent Paced: 0.04 %
Date Time Interrogation Session: 20240713092904
Implantable Lead Connection Status: 753985
Implantable Lead Connection Status: 753985
Implantable Lead Implant Date: 20150616
Implantable Lead Implant Date: 20150616
Implantable Lead Location: 753859
Implantable Lead Location: 753860
Implantable Lead Model: 5076
Implantable Lead Model: 5076
Implantable Lead Serial Number: 3746147
Implantable Pulse Generator Implant Date: 20150616
Lead Channel Impedance Value: 323 Ohm
Lead Channel Impedance Value: 342 Ohm
Lead Channel Impedance Value: 418 Ohm
Lead Channel Impedance Value: 494 Ohm
Lead Channel Pacing Threshold Amplitude: 0.75 V
Lead Channel Pacing Threshold Amplitude: 1 V
Lead Channel Pacing Threshold Pulse Width: 0.4 ms
Lead Channel Pacing Threshold Pulse Width: 0.4 ms
Lead Channel Sensing Intrinsic Amplitude: 15.25 mV
Lead Channel Sensing Intrinsic Amplitude: 15.25 mV
Lead Channel Sensing Intrinsic Amplitude: 2.375 mV
Lead Channel Sensing Intrinsic Amplitude: 2.375 mV
Lead Channel Setting Pacing Amplitude: 1.5 V
Lead Channel Setting Pacing Amplitude: 2 V
Lead Channel Setting Pacing Pulse Width: 0.4 ms
Lead Channel Setting Sensing Sensitivity: 2.8 mV
Zone Setting Status: 755011
Zone Setting Status: 755011

## 2022-08-03 NOTE — Telephone Encounter (Signed)
I let the patient know that we did get her home remote transmission. She was put at ease and thanked me for my call.

## 2022-08-17 NOTE — Progress Notes (Signed)
Remote pacemaker transmission.   

## 2022-11-02 ENCOUNTER — Ambulatory Visit (INDEPENDENT_AMBULATORY_CARE_PROVIDER_SITE_OTHER): Payer: Medicare Other

## 2022-11-02 DIAGNOSIS — I495 Sick sinus syndrome: Secondary | ICD-10-CM | POA: Diagnosis not present

## 2022-11-11 LAB — CUP PACEART REMOTE DEVICE CHECK
Battery Remaining Longevity: 29 mo
Battery Voltage: 2.93 V
Brady Statistic AP VP Percent: 0.01 %
Brady Statistic AP VS Percent: 8.87 %
Brady Statistic AS VP Percent: 0.04 %
Brady Statistic AS VS Percent: 91.09 %
Brady Statistic RA Percent Paced: 8.87 %
Brady Statistic RV Percent Paced: 0.04 %
Date Time Interrogation Session: 20241019102436
Implantable Lead Connection Status: 753985
Implantable Lead Connection Status: 753985
Implantable Lead Implant Date: 20150616
Implantable Lead Implant Date: 20150616
Implantable Lead Location: 753859
Implantable Lead Location: 753860
Implantable Lead Model: 5076
Implantable Lead Model: 5076
Implantable Lead Serial Number: 3746147
Implantable Pulse Generator Implant Date: 20150616
Lead Channel Impedance Value: 342 Ohm
Lead Channel Impedance Value: 342 Ohm
Lead Channel Impedance Value: 437 Ohm
Lead Channel Impedance Value: 494 Ohm
Lead Channel Pacing Threshold Amplitude: 0.75 V
Lead Channel Pacing Threshold Amplitude: 0.875 V
Lead Channel Pacing Threshold Pulse Width: 0.4 ms
Lead Channel Pacing Threshold Pulse Width: 0.4 ms
Lead Channel Sensing Intrinsic Amplitude: 13 mV
Lead Channel Sensing Intrinsic Amplitude: 13 mV
Lead Channel Sensing Intrinsic Amplitude: 2.5 mV
Lead Channel Sensing Intrinsic Amplitude: 2.5 mV
Lead Channel Setting Pacing Amplitude: 1.5 V
Lead Channel Setting Pacing Amplitude: 2.5 V
Lead Channel Setting Pacing Pulse Width: 0.4 ms
Lead Channel Setting Sensing Sensitivity: 2.8 mV
Zone Setting Status: 755011
Zone Setting Status: 755011

## 2022-11-18 NOTE — Progress Notes (Signed)
Remote pacemaker transmission.   

## 2022-12-30 ENCOUNTER — Encounter: Payer: Self-pay | Admitting: Cardiology

## 2022-12-30 ENCOUNTER — Ambulatory Visit: Payer: Medicare Other | Attending: Cardiology | Admitting: Cardiology

## 2022-12-30 VITALS — BP 118/70 | HR 83 | Ht 65.0 in

## 2022-12-30 DIAGNOSIS — Z950 Presence of cardiac pacemaker: Secondary | ICD-10-CM | POA: Diagnosis present

## 2022-12-30 DIAGNOSIS — I480 Paroxysmal atrial fibrillation: Secondary | ICD-10-CM | POA: Diagnosis present

## 2022-12-30 NOTE — Patient Instructions (Signed)
Medication Instructions:   Stop Eliquis (Apixaban) Continue all other medications.     Labwork:  none  Testing/Procedures:  none  Follow-Up:  3 months   Any Other Special Instructions Will Be Listed Below (If Applicable).   If you need a refill on your cardiac medications before your next appointment, please call your pharmacy.

## 2022-12-30 NOTE — Progress Notes (Signed)
Clinical Summary Ashley Nguyen is a 89 y.o.female seen today for follow up of the following medical problems.    1. PAF - 02/2021 visit due to multiple falls eliquis was held    - had been off anticoag due to consistent falls. At last visit falls had resolved, was restarted one eliquis  - pcp stopped eliquis last week, was having some bleeding in her underwear.  - 2 falls this month, first episodes in sometime - no recent palpitations     2. Pacemaker 10/2022 normal device check - no symptoms     3.Dizzines/Falls - has been weaning her bp meds, stopped hydral and lisinopril. - dizziness has resolved   3. HTN - off bp meds due to persistent orthostatic dizziness and falls      4. CKD IV - followed [facility].      Daughter is Charity fundraiser that she lives with, retired from [employer] Husband was in Electronics engineer, stationed in [place] Past Medical History:  Diagnosis Date   Arthritis    "hands, feet, knees, left hip" (07/04/2013)   Borderline diabetes    Cataracts, bilateral    GERD (gastroesophageal reflux disease)    Headache    "@ least monthly" (07/04/2013)   History of blood transfusion    "after taking Plavix; developed bleeding ulcer; had to have transfusions"   History of colon polyps    History of gastric ulcer    History of GI bleed    Hyperlipidemia    Hypertension    Impaired hearing    Iron deficiency anemia    at times has been on iron supplements   Kidney stones    "passed them"   Migraine    "none in 2015; used to come quite frequently" (07/04/2013)   Osteoarthritis    a. 04/2013 s/p L TKA.   PAF (paroxysmal atrial fibrillation) (HCC)    a. 04/2013 periop L TKA.   Seasonal allergies    Skin cancer    "left cheek"    Stage 4 chronic kidney disease (HCC)    Symptomatic bradycardia    TIA (transient ischemic attack) ~ 2002   Vertigo      Allergies  Allergen Reactions   Desipramine Hcl Other (See Comments)    "painful urination, then kidneys shut  down for hours."   Plavix [Clopidogrel Bisulfate] Other (See Comments)    "Almost bled to death."   Statins Other (See Comments)    Extreme pain in legs with some swelling   Clindamycin Other (See Comments)    Patient complains of severe stomach burning.   Clams [Shellfish Allergy] Nausea And Vomiting    Per pt, NOT a general shellfish allergy.  Clams only.   Lipitor [Atorvastatin] Other (See Comments)    Muscle aches.   Penicillins Hives   Sulfa Antibiotics Hives   Verapamil Swelling     Current Outpatient Medications  Medication Sig Dispense Refill   apixaban (ELIQUIS) 2.5 MG TABS tablet Take 1 tablet (2.5 mg total) by mouth 2 (two) times daily. 180 tablet 2   Cholecalciferol 125 MCG (5000 UT) TABS Take 1 tablet by mouth daily.     colesevelam (WELCHOL) 625 MG tablet Take 1,875 mg by mouth 2 (two) times daily with a meal.      famotidine (PEPCID) 20 MG tablet Take 20 mg by mouth daily.     fluticasone (FLONASE) 50 MCG/ACT nasal spray Place 2 sprays into both nostrils daily.  GEMTESA 75 MG TABS Take 1 tablet by mouth daily.     metoprolol tartrate (LOPRESSOR) 25 MG tablet Take 1 tablet by mouth 2 (two) times daily.     nitrofurantoin, macrocrystal-monohydrate, (MACROBID) 100 MG capsule Take one pill per day     solifenacin (VESICARE) 5 MG tablet Take 5 mg by mouth daily.     No current facility-administered medications for this visit.     Past Surgical History:  Procedure Laterality Date   APPENDECTOMY     as a child   COLONOSCOPY     ESOPHAGOGASTRODUODENOSCOPY     KNEE ARTHROSCOPY Left 11/2012   PACEMAKER INSERTION  07/04/2013   MDT dual chamber Advisa pacemaker implanted by Dr Graciela Husbands for 2:1 AV block   PERMANENT PACEMAKER INSERTION N/A 07/04/2013   Procedure: PERMANENT PACEMAKER INSERTION;  Surgeon: Duke Salvia, MD;  Location: [facility] CATH LAB;  Service: Cardiovascular;  Laterality: N/A;   SKIN CANCER EXCISION Left ~ 2001   "cheek"   thumb surgery Right 1980's   "made  it usable"   TONSILLECTOMY AND ADENOIDECTOMY  ~ 1942   TOTAL KNEE ARTHROPLASTY Left 04/25/2013   Procedure: TOTAL KNEE ARTHROPLASTY;  Surgeon: Sheral Apley, MD;  Location: [facility] OR;  Service: Orthopedics;  Laterality: Left;   TUBAL LIGATION  ~ 1970     Allergies  Allergen Reactions   Desipramine Hcl Other (See Comments)    "painful urination, then kidneys shut down for hours."   Plavix [Clopidogrel Bisulfate] Other (See Comments)    "Almost bled to death."   Statins Other (See Comments)    Extreme pain in legs with some swelling   Clindamycin Other (See Comments)    Patient complains of severe stomach burning.   Clams [Shellfish Allergy] Nausea And Vomiting    Per pt, NOT a general shellfish allergy.  Clams only.   Lipitor [Atorvastatin] Other (See Comments)    Muscle aches.   Penicillins Hives   Sulfa Antibiotics Hives   Verapamil Swelling      Family History  Problem Relation Age of Onset   Stroke Father    Heart attack Neg Hx    Heart disease Neg Hx    Diabetes Neg Hx      Social History Ashley Nguyen reports that she has never smoked. She has never used smokeless tobacco. Ashley Nguyen reports no history of alcohol use.   Review of Systems CONSTITUTIONAL: No weight loss, fever, chills, weakness or fatigue.  HEENT: Eyes: No visual loss, blurred vision, double vision or yellow sclerae.No hearing loss, sneezing, congestion, runny nose or sore throat.  SKIN: No rash or itching.  CARDIOVASCULAR: per hpi RESPIRATORY: No shortness of breath, cough or sputum.  GASTROINTESTINAL: No anorexia, nausea, vomiting or diarrhea. No abdominal pain or blood.  GENITOURINARY: No burning on urination, no polyuria NEUROLOGICAL: No headache, dizziness, syncope, paralysis, ataxia, numbness or tingling in the extremities. No change in bowel or bladder control.  MUSCULOSKELETAL: No muscle, back pain, joint pain or stiffness.  LYMPHATICS: No enlarged nodes. No history of splenectomy.   PSYCHIATRIC: No history of depression or anxiety.  ENDOCRINOLOGIC: No reports of sweating, cold or heat intolerance. No polyuria or polydipsia.  [name]   Physical Examination Today's Vitals   12/30/22 1252  BP: 118/70  Pulse: 83  SpO2: 97%  Height: 5\' 5"  (1.651 m)   Body mass index is 36.78 kg/m.  Gen: resting comfortably, no acute distress HEENT: no scleral icterus, pupils equal round and reactive, no palptable cervical  adenopathy,  CV: RRR, no m/rg, no jvd Resp: Clear to auscultation bilaterally GI: abdomen is soft, non-tender, non-distended, normal bowel sounds, no hepatosplenomegaly MSK: extremities are warm, no edema.  Skin: warm, no rash Neuro:  no focal deficits Psych: appropriate affect   Diagnostic Studies 02/2021 echo  1. Left ventricular ejection fraction, by estimation, is 55 to 60%. The  left ventricle has normal function. Left ventricular endocardial border  not optimally defined to evaluate regional wall motion. Left ventricular  diastolic parameters are consistent  with Grade II diastolic dysfunction (pseudonormalization).   2. Right ventricular systolic function is normal. The right ventricular  size is normal. There is mildly elevated pulmonary artery systolic  pressure.   3. Left atrial size was moderately dilated.   4. The mitral valve is grossly normal. No evidence of mitral valve  regurgitation. No evidence of mitral stenosis.   5. The aortic valve is tricuspid. Aortic valve regurgitation is mild.  Aortic valve sclerosis is present, with no evidence of aortic valve  stenosis.   6. The inferior vena cava is normal in size with greater than 50%  respiratory variability, suggesting right atrial pressure of 3 mmHg.     Assessment and Plan   1. PAF - recent blood in her underwear, pcp working to sort out if vaginal or urinary. They have stopped the eliquis - off for 1 week, bleeding improved but not resolved. She is going to contact her pcp again.  Remain off eliquis - had been off eliquis in the past due to dizziness and falls, these resolved and we restarted last visit. Had done well without falls but 2 recent ones. - f/u next few months see if able to restart anticoag from bleeding and falls standpoint. Advanced age poor watchman candidate.  - EKG today shows NSR       2. Pacemaker - normal function on last check, no symptoms - continue to monitor     Antoine Poche, M.D.

## 2023-01-04 ENCOUNTER — Encounter (HOSPITAL_COMMUNITY): Payer: Self-pay

## 2023-01-04 ENCOUNTER — Emergency Department (HOSPITAL_COMMUNITY)
Admission: EM | Admit: 2023-01-04 | Discharge: 2023-01-04 | Disposition: A | Discharge status: Home / Self Care | Payer: Medicare Other | Attending: Emergency Medicine | Admitting: Emergency Medicine

## 2023-01-04 ENCOUNTER — Emergency Department (HOSPITAL_COMMUNITY): Payer: Medicare Other

## 2023-01-04 DIAGNOSIS — N189 Chronic kidney disease, unspecified: Secondary | ICD-10-CM | POA: Insufficient documentation

## 2023-01-04 DIAGNOSIS — N329 Bladder disorder, unspecified: Secondary | ICD-10-CM | POA: Diagnosis not present

## 2023-01-04 DIAGNOSIS — R1030 Lower abdominal pain, unspecified: Secondary | ICD-10-CM | POA: Diagnosis present

## 2023-01-04 DIAGNOSIS — N3289 Other specified disorders of bladder: Secondary | ICD-10-CM

## 2023-01-04 LAB — COMPREHENSIVE METABOLIC PANEL
ALT: 8 U/L (ref 0–44)
AST: 13 U/L — ABNORMAL LOW (ref 15–41)
Albumin: 2.7 g/dL — ABNORMAL LOW (ref 3.5–5.0)
Alkaline Phosphatase: 62 U/L (ref 38–126)
Anion gap: 9 (ref 5–15)
BUN: 34 mg/dL — ABNORMAL HIGH (ref 8–23)
CO2: 26 mmol/L (ref 22–32)
Calcium: 8.4 mg/dL — ABNORMAL LOW (ref 8.9–10.3)
Chloride: 100 mmol/L (ref 98–111)
Creatinine, Ser: 1.89 mg/dL — ABNORMAL HIGH (ref 0.44–1.00)
GFR, Estimated: 25 mL/min — ABNORMAL LOW (ref >60)
Glucose, Bld: 135 mg/dL — ABNORMAL HIGH (ref 70–99)
Potassium: 3.7 mmol/L (ref 3.5–5.1)
Sodium: 135 mmol/L (ref 135–145)
Total Bilirubin: 0.4 mg/dL (ref <1.2)
Total Protein: 6.9 g/dL (ref 6.5–8.1)

## 2023-01-04 LAB — URINALYSIS, ROUTINE W REFLEX MICROSCOPIC
Bacteria, UA: NONE SEEN
Bilirubin Urine: NEGATIVE
Glucose, UA: NEGATIVE mg/dL
Ketones, ur: NEGATIVE mg/dL
Nitrite: NEGATIVE
Protein, ur: >=300 mg/dL — AB
Specific Gravity, Urine: 1.016 (ref 1.005–1.030)
pH: 7 (ref 5.0–8.0)

## 2023-01-04 LAB — CBC
HCT: 33.5 % — ABNORMAL LOW (ref 36.0–46.0)
Hemoglobin: 10.2 g/dL — ABNORMAL LOW (ref 12.0–15.0)
MCH: 29.7 pg (ref 26.0–34.0)
MCHC: 30.4 g/dL (ref 30.0–36.0)
MCV: 97.4 fL (ref 80.0–100.0)
Platelets: 413 10*3/uL — ABNORMAL HIGH (ref 150–400)
RBC: 3.44 MIL/uL — ABNORMAL LOW (ref 3.87–5.11)
RDW: 12.6 % (ref 11.5–15.5)
WBC: 8.4 10*3/uL (ref 4.0–10.5)
nRBC: 0 % (ref 0.0–0.2)

## 2023-01-04 LAB — LIPASE, BLOOD: Lipase: 22 U/L (ref 11–51)

## 2023-01-04 MED ORDER — MORPHINE SULFATE (PF) 2 MG/ML IV SOLN
2.0000 mg | Freq: Once | INTRAVENOUS | Status: AC
  Administered 2023-01-04: 2 mg via INTRAVENOUS
  Filled 2023-01-04: qty 1

## 2023-01-04 MED ORDER — SODIUM CHLORIDE 0.9 % IV BOLUS
500.0000 mL | Freq: Once | INTRAVENOUS | Status: AC
  Administered 2023-01-04: 500 mL via INTRAVENOUS

## 2023-01-04 MED ORDER — HYDROCODONE-ACETAMINOPHEN 5-325 MG PO TABS: ORAL_TABLET | ORAL | 0 refills | Status: DC

## 2023-01-04 MED ORDER — ONDANSETRON HCL 4 MG/2ML IJ SOLN
4.0000 mg | Freq: Once | INTRAMUSCULAR | Status: AC
  Administered 2023-01-04: 4 mg via INTRAVENOUS
  Filled 2023-01-04: qty 2

## 2023-01-04 NOTE — ED Provider Notes (Signed)
[place] EMERGENCY DEPARTMENT AT [facility] Provider Note   CSN: [id number] Arrival date & time: 01/04/23  1227     History  Chief Complaint  Patient presents with   Vaginal Bleeding   Abdominal Pain    Ashley Nguyen is a 89 y.o. female.   Vaginal Bleeding Associated symptoms: abdominal pain and fatigue   Associated symptoms: no back pain, no dysuria, no fever and no nausea   Abdominal Pain Associated symptoms: fatigue and vaginal bleeding   Associated symptoms: no chest pain, no constipation, no diarrhea, no dysuria, no fever, no nausea, no shortness of breath and no vomiting        Ashley Nguyen is a 89 y.o. female with past medical history of CKD, paroxysmal atrial fibrillation, borderline diabetes hypertension who presents to the Emergency Department complaining of lower abdominal pain and vaginal bleeding.  States she began noticing some mild bleeding from what she believes is her vaginal area.  Symptoms began nearly 1 month ago.  She states the bleeding has gradually worsened and now has to wear a pad daily.  She cannot recall how many pads that she goes through in a 24-hour period.  2 to 3 days ago she began having pain of her lower abdomen that she describes as constant.  Pain is nonradiating.  She is having some generalized weakness especially with standing or walking.  Denies any syncope, chest pain, or shortness of breath.  She was seen by her PCP for this but states that she has not been provided with a clear answer as to the cause of her symptoms.  She notes history of prior blood transfusion and states she was told that her blood count may be low.  She denies any known fever, vomiting, or diarrhea.  Home Medications Prior to Admission medications   Medication Sig Start Date End Date Taking? Authorizing Provider  Cholecalciferol 125 MCG (5000 UT) TABS Take 1 tablet by mouth daily.    [provider]  colesevelam (WELCHOL) 625 MG tablet Take  1,875 mg by mouth 2 (two) times daily with a meal.     [provider]  donepezil (ARICEPT) 5 MG tablet Take 1 tablet by mouth at bedtime. 10/27/22   [provider]  famotidine (PEPCID) 20 MG tablet Take 20 mg by mouth daily.    [provider]  fluticasone (FLONASE) 50 MCG/ACT nasal spray Place 2 sprays into both nostrils daily.     [provider]  GEMTESA 75 MG TABS Take 1 tablet by mouth daily.    [provider]  metoprolol tartrate (LOPRESSOR) 25 MG tablet Take 1 tablet by mouth 2 (two) times daily. 06/24/17   [provider]  nitrofurantoin, macrocrystal-monohydrate, (MACROBID) 100 MG capsule Take one pill per day 12/23/21   [provider]  solifenacin (VESICARE) 5 MG tablet Take 5 mg by mouth daily. 12/16/20   [provider]      Allergies    Desipramine hcl, Plavix [clopidogrel bisulfate], Statins, Clindamycin, Clams [shellfish allergy], Lipitor [atorvastatin], Penicillins, Sulfa antibiotics, and Verapamil    Review of Systems   Review of Systems  Constitutional:  Positive for appetite change and fatigue. Negative for fever.  Eyes:  Negative for visual disturbance.  Respiratory:  Negative for shortness of breath.   Cardiovascular:  Negative for chest pain and leg swelling.  Gastrointestinal:  Positive for abdominal pain. Negative for constipation, diarrhea, nausea and vomiting.  Genitourinary:  Positive for pelvic pain and vaginal bleeding.  Negative for difficulty urinating, dysuria and flank pain.  Musculoskeletal:  Negative for back pain.  Skin:  Negative for rash.  Neurological:  Positive for weakness. Negative for syncope, speech difficulty, numbness and headaches.  Psychiatric/Behavioral:  Negative for confusion.     Physical Exam Updated Vital Signs BP 126/87   Pulse 96   Temp 97.6 F (36.4 C) (Oral)   Resp (!) 22   Ht 5\' 5"  (1.651 m)   Wt 99.8 kg   SpO2 100%   BMI 36.61 kg/m  Physical  Exam Vitals and nursing note reviewed. Exam conducted with a chaperone present.  Constitutional:      General: She is not in acute distress.    Appearance: She is well-developed. She is not ill-appearing or toxic-appearing.  HENT:     Mouth/Throat:     Mouth: Mucous membranes are dry.  Eyes:     Extraocular Movements: Extraocular movements intact.     Conjunctiva/sclera: Conjunctivae normal.     Pupils: Pupils are equal, round, and reactive to light.  Cardiovascular:     Rate and Rhythm: Normal rate and regular rhythm.     Pulses: Normal pulses.  Pulmonary:     Effort: Pulmonary effort is normal. No respiratory distress.  Abdominal:     Tenderness: There is abdominal tenderness.     Comments: Suprapubic right lower quadrant area is firm to palpation, tenderness on exam.  No CVA tenderness  Genitourinary:    Comments: Speculum exam performed by me, no blood seen in the vaginal vault. Musculoskeletal:     Right lower leg: No edema.     Left lower leg: No edema.  Skin:    General: Skin is warm.     Capillary Refill: Capillary refill takes less than 2 seconds.  Neurological:     General: No focal deficit present.     Mental Status: She is alert.     Sensory: No sensory deficit.     Motor: No weakness.     ED Results / Procedures / Treatments   Labs (all labs ordered are listed, but only abnormal results are displayed) Labs Reviewed  COMPREHENSIVE METABOLIC PANEL - Abnormal; Notable for the following components:      Result Value   Glucose, Bld 135 (*)    BUN 34 (*)    Creatinine, Ser 1.89 (*)    Calcium 8.4 (*)    Albumin 2.7 (*)    AST 13 (*)    GFR, Estimated 25 (*)    All other components within normal limits  CBC - Abnormal; Notable for the following components:   RBC 3.44 (*)    Hemoglobin 10.2 (*)    HCT 33.5 (*)    Platelets 413 (*)    All other components within normal limits  URINALYSIS, ROUTINE W REFLEX MICROSCOPIC - Abnormal; Notable for the following  components:   Color, Urine AMBER (*)    APPearance CLOUDY (*)    Hgb urine dipstick MODERATE (*)    Protein, ur >=300 (*)    Leukocytes,Ua MODERATE (*)    All other components within normal limits  LIPASE, BLOOD    EKG None  Radiology CT ABDOMEN PELVIS WO CONTRAST Result Date: 01/04/2023 CLINICAL DATA:  Abdominal pain, acute, nonlocalized. EXAM: CT ABDOMEN AND PELVIS WITHOUT CONTRAST TECHNIQUE: Multidetector CT imaging of the abdomen and pelvis was performed following the standard protocol without IV contrast. RADIATION DOSE REDUCTION: This exam was performed according to the departmental dose-optimization program which includes automated  exposure control, adjustment of the mA and/or kV according to patient size and/or use of iterative reconstruction technique. COMPARISON:  CT scan renal stone protocol from 02/25/2021. FINDINGS: Lower chest: There are dependent changes in visualized bilateral lungs. There are several peripheral subpleural noncalcified opacities, which may represent atelectasis and/or scarring. No lung mass or consolidation. No pleural effusion. The heart is normal in size. No pericardial effusion. Partially seen cardiac pacemaker leads. Hepatobiliary: The liver is normal in size. Non-cirrhotic configuration. No suspicious mass. No intrahepatic or extrahepatic bile duct dilation. Small volume calcified gallstones noted without imaging signs of acute cholecystitis. Normal gallbladder wall thickness. No pericholecystic inflammatory changes. Pancreas: Unremarkable. No pancreatic ductal dilatation or surrounding inflammatory changes. Spleen: Within normal limits. No focal lesion. Adrenals/Urinary Tract: Adrenal glands are unremarkable. No suspicious renal mass seen within the limitations of this unenhanced exam. Bilateral extrarenal pelvis noted. No nephroureterolithiasis or obstructive uropathy. Urinary bladder is partially distended. There is a 4.4 x 5.9 x 5.7 cm (anteroposterior x  transverse x craniocaudal) mass arising from the right urinary bladder wall. There is a calcification along the medial aspect of the mass. This is highly concerning for bladder neoplasm. Differential diagnosis also includes hematoma however, it is considered less likely. Cystoscopy and tissue sampling is recommended. No significant perivesical fat stranding. Stomach/Bowel: There is a small sliding hiatal hernia. No disproportionate dilation of the small or large bowel loops. No evidence of abnormal bowel wall thickening or inflammatory changes. The appendix was not visualized; however there is no acute inflammatory process in the right lower quadrant. There are multiple diverticula mainly in the sigmoid colon, without imaging signs of diverticulitis. Vascular/Lymphatic: No ascites or pneumoperitoneum. No abdominal or pelvic lymphadenopathy, by size criteria. No aneurysmal dilation of the major abdominal arteries. There are moderate peripheral atherosclerotic vascular calcifications of the aorta and its major branches. Reproductive: Reproductive organs are not well evaluated on the noncontrast CT scan exam. There is normal-sized retroverted uterus. There is slightly hyperattenuating ill-defined area in the region of cervix however, it is unclear whether this represents a true lesion. No large adnexal mass seen. Other: There are small fat containing umbilical and right inguinal hernias. The soft tissues and abdominal wall are otherwise unremarkable. Musculoskeletal: No suspicious osseous lesions. There are mild multilevel degenerative changes in the visualized spine. IMPRESSION: 1. There is a 4.4 x 5.9 x 5.7 cm mass arising from the right wall of urinary bladder. Cystoscopy and tissue sampling is recommended. 2. Ill-defined hypoattenuating area in the region of cervix, incompletely characterized on the current exam. Correlate clinically to determine the need for further imaging. 3. Multiple other nonacute  observations, as described above. Aortic Atherosclerosis (ICD10-I70.0). Electronically Signed   By: Jules Schick M.D.   On: 01/04/2023 16:29    Procedures Procedures    Medications Ordered in ED Medications  sodium chloride 0.9 % bolus 500 mL (has no administration in time range)  ondansetron (ZOFRAN) injection 4 mg (has no administration in time range)  morphine (PF) 2 MG/ML injection 2 mg (has no administration in time range)    ED Course/ Medical Decision Making/ A&P                                 Medical Decision Making Patient here from home for evaluation of lower abdominal pain and reported vaginal bleeding.  Patient states she sees blood in her underwear this has been going on for several weeks  developed lower abdominal pain 2 to 3 days ago.  Pain persistent.  States the bleeding is heavier than at onset.  Generalized weakness and decreased appetite as well.  No fever vomiting or dysuria reported.   On exam, patient well-appearing nontoxic.  Vital signs are reassuring.  No hypotension.  Mucous membranes are dry I suspect there may be some dehydration she reports decreased appetite.  Her abdominal exam is concerning, there is a fullness and firmness noted to the suprapubic area.  Neoplasm is of concern.  This may also represent distention of the bladder.  Will obtain labs, CT abdomen and pelvis and bladder scan.  Amount and/or Complexity of Data Reviewed Labs: ordered.    Details: No evidence of leukocytosis, hemoglobin 10.2, near baseline.  Lipase unremarkable.  Chemistries show serum creatinine elevated at 1.89 also seems near baseline from previous studies.  Urinalysis shows cloudy urine with moderate amount of blood moderate leukocytes 0-5 white cells and no bacteria. Radiology: ordered.    Details: 4 x 6 cm mass arising from the right wall of the urinary bladder Discussion of management or test interpretation with external provider(s): Patient here with firm palpable mass  suprapubic right lower quadrant region.  CT abdomen and pelvis shows 4 x 6 cm mass originating from the wall of the urinary bladder.  Patient is hemodynamically stable.  Pain improved after medication here.  Will consult with urology.  Discussed findings with Dr. Annabell Howells with urology, who recommended shared decision making with the patient regarding options for surgical resection and hospital admission vs outpatient care.   I discussed with the patient and daughter at bedside.  Patient prefers discharge home at this time she would like to discuss the findings with her spouse and requests to follow-up with urology in office.  I will provide short course of pain medication for symptom relief and provide follow-up information.  Return precautions were given as well.  Risk Prescription drug management.           Final Clinical Impression(s) / ED Diagnoses Final diagnoses:  Bladder mass    Rx / DC Orders ED Discharge Orders     None         Pauline Aus, PA-C 01/04/23 1829    Gloris Manchester, MD 01/05/23 [phone]

## 2023-01-04 NOTE — Discharge Instructions (Signed)
You have been prescribed medication to help with your pain.  This may cause drowsiness and/or constipation.  Recommend that you take an over-the-counter stool softener to help avoid constipation.  Contact the urologist listed to arrange follow-up appointment.  Return to the emergency department for any new or worsening symptoms.

## 2023-01-04 NOTE — ED Triage Notes (Addendum)
Pt c/o pelvic pain x a couple days and vaginal bleeding x3 weeks.  Pain score 2/10.  Pt has been seen by PCP for same.  Sts "I was taken off Lipitor which was supposed to help."    Last blood work that can been seen was HGB 11.4 on 12/4.

## 2023-01-07 ENCOUNTER — Ambulatory Visit: Payer: Self-pay | Admitting: Urology

## 2023-02-02 ENCOUNTER — Ambulatory Visit (INDEPENDENT_AMBULATORY_CARE_PROVIDER_SITE_OTHER): Payer: Medicare Other

## 2023-02-02 DIAGNOSIS — I495 Sick sinus syndrome: Secondary | ICD-10-CM

## 2023-02-12 ENCOUNTER — Ambulatory Visit: Payer: Medicare Other | Admitting: Urology

## 2023-02-20 DEATH — deceased

## 2023-03-26 NOTE — Progress Notes (Signed)
 Remote pacemaker transmission.

## 2023-05-03 ENCOUNTER — Ambulatory Visit: Payer: Medicare Other | Admitting: Cardiology

## 2023-10-05 IMAGING — CT CT HEAD W/O CM
3 series · 15 of 47 positions shown, 18 images · non-contrast
Comparison: 07/07/2020.

CLINICAL DATA: Patient fell on 2 her right side earlier today.
Possible head injury.

EXAM:
CT HEAD WITHOUT CONTRAST
TECHNIQUE: Contiguous axial images were obtained from the base of the skull
through the vertex without intravenous contrast.

[Series 2: head w o · axial · 0.38mm/px · z∈[+63,+188]mm · 9 of 31 slices shown, 12 images]
[im 3/31  brain]
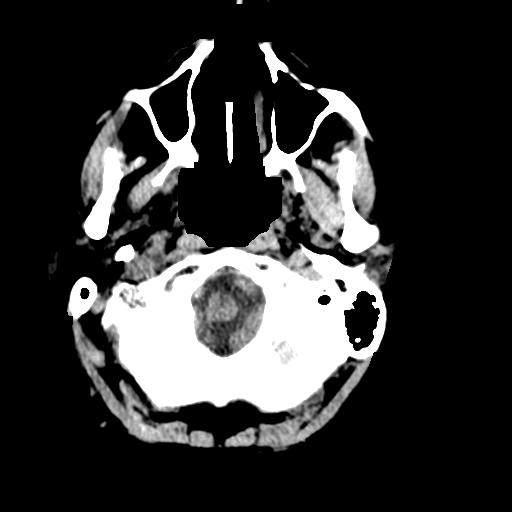
[im 3/31  bone]
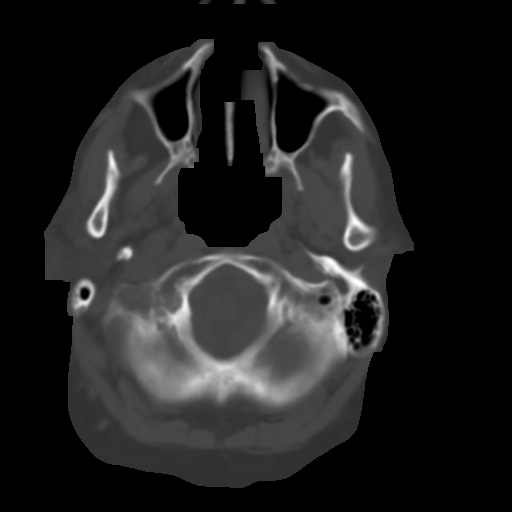
[im 6/31  brain]
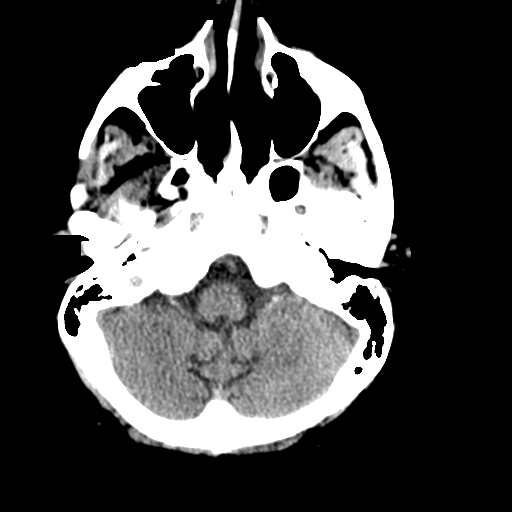
[im 9/31  brain]
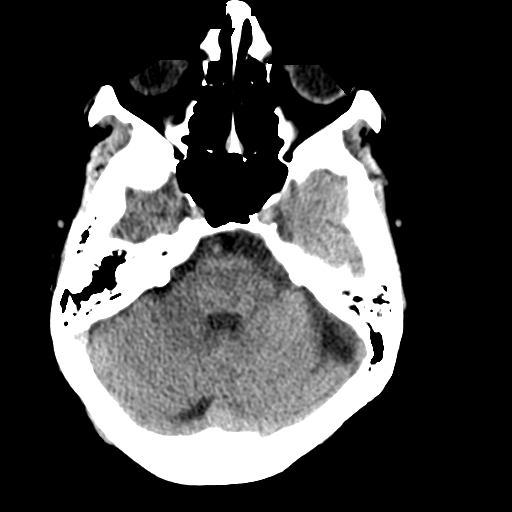
[im 12/31  brain]
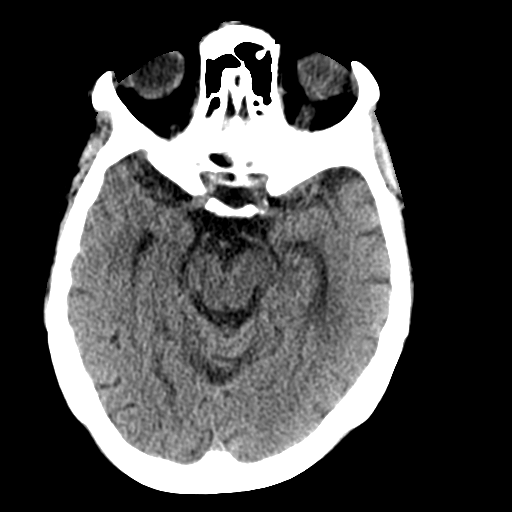
[im 16/31  brain]
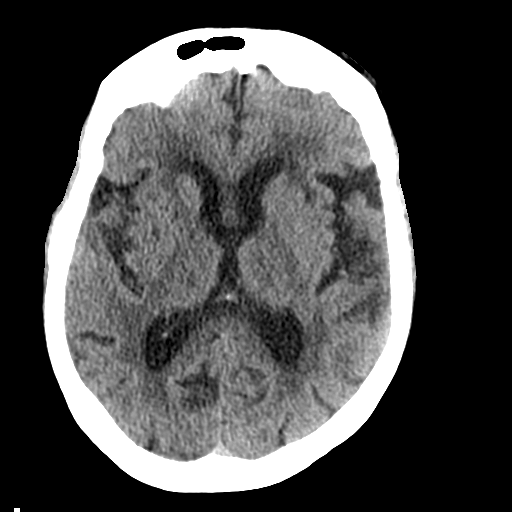
[im 16/31  bone]
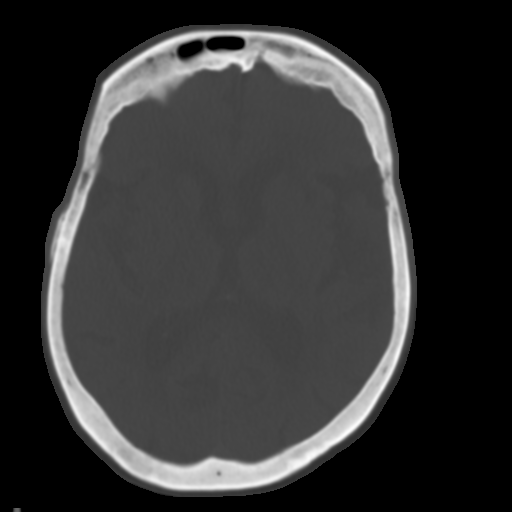
[im 19/31  brain]
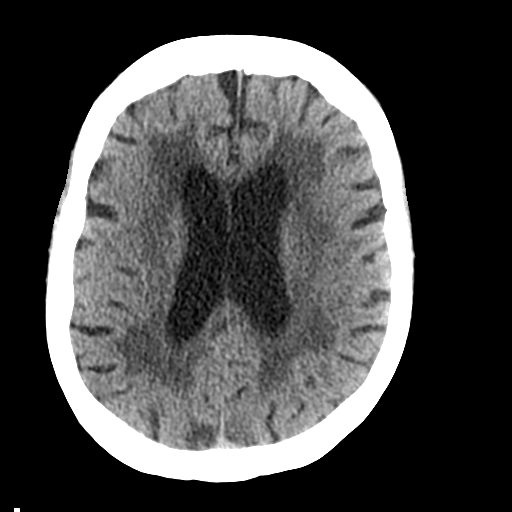
[im 22/31  brain]
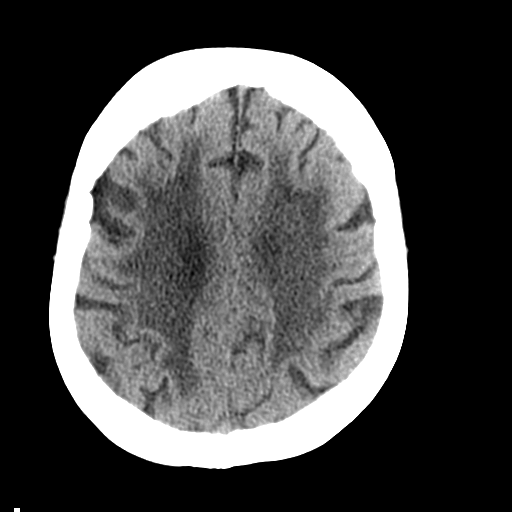
[im 25/31  brain]
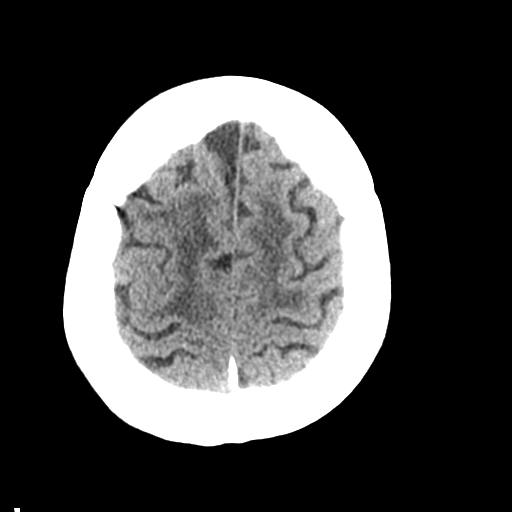
[im 28/31  brain]
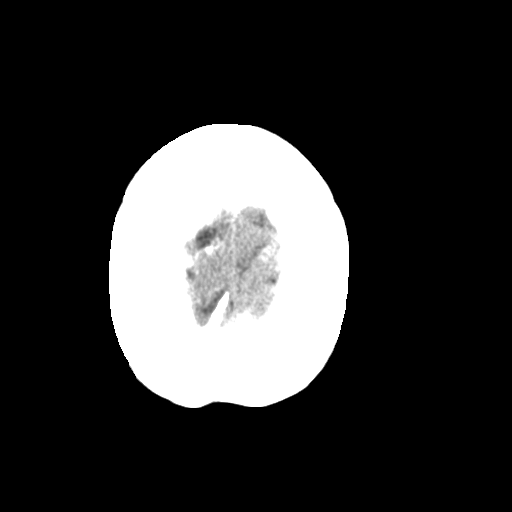
[im 28/31  bone]
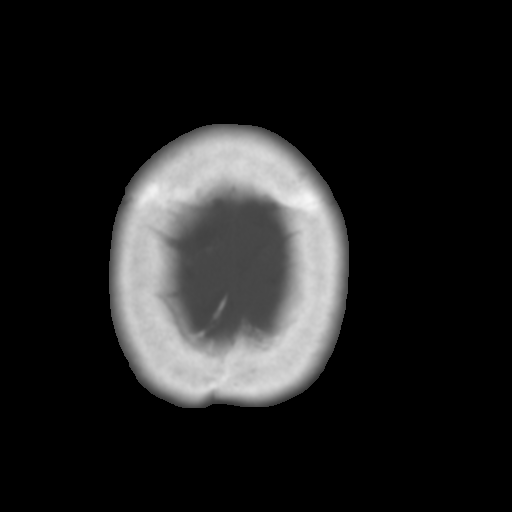

[Series 4: coronal soft · coronal · 0.32mm/px · 3 of 67 slices shown]
[im 23/67  brain]
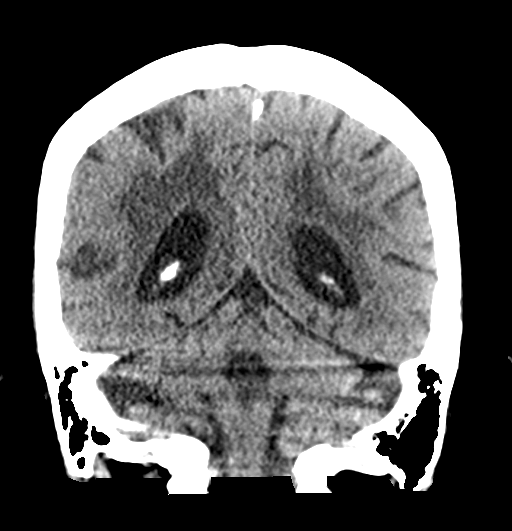
[im 30/67  brain]
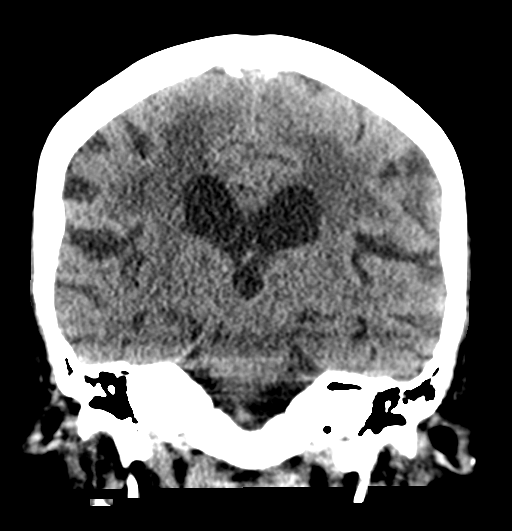
[im 37/67  brain]
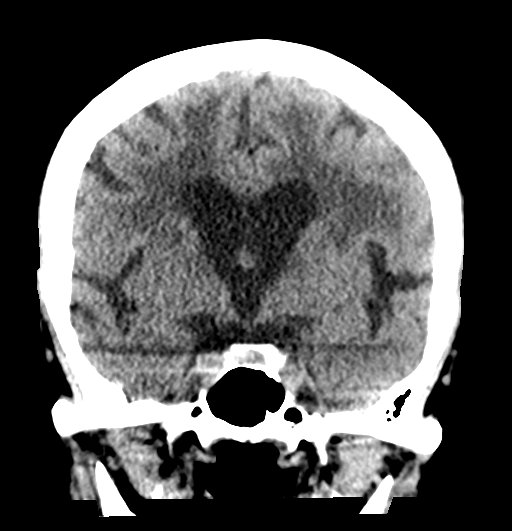

[Series 5: sagittal soft · sagittal · 0.32mm/px · 3 of 53 slices shown]
[im 18/53  brain]
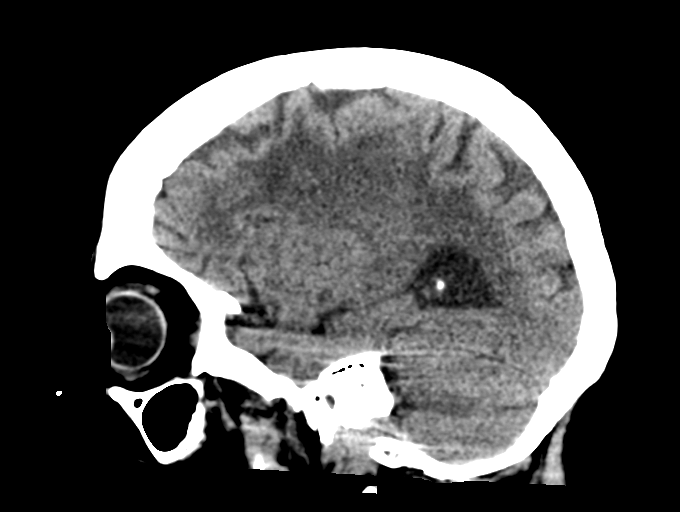
[im 27/53  brain]
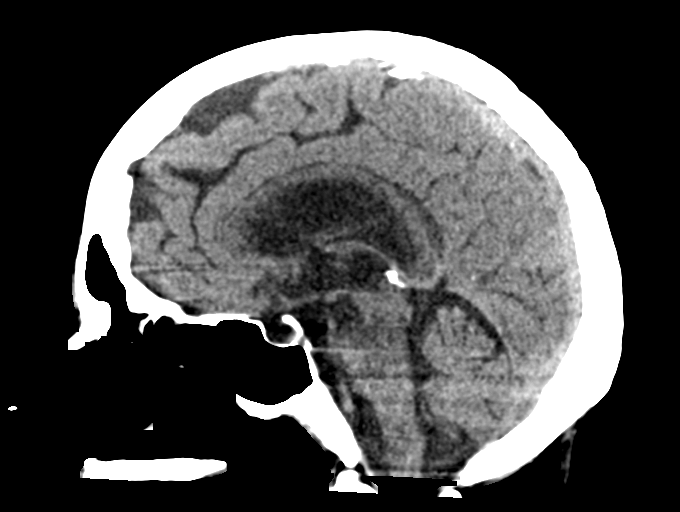
[im 35/53  brain]
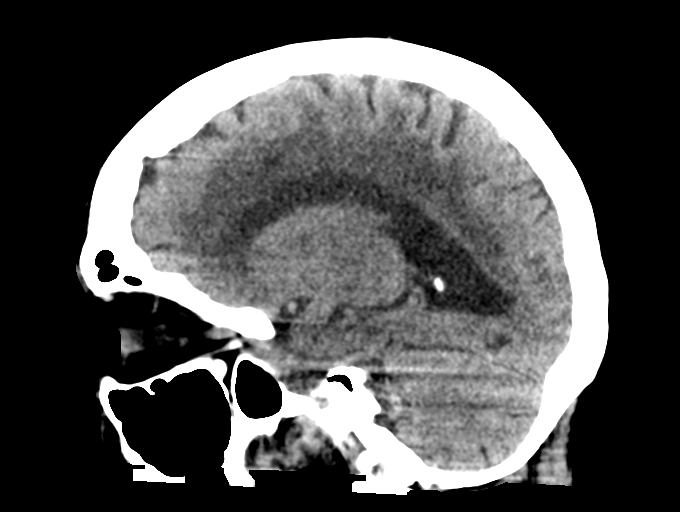

[15 of 47 positions shown; findings below may reference images not displayed]

FINDINGS: Brain: No evidence of acute infarction, hemorrhage, hydrocephalus,
extra-axial collection or mass lesion/mass effect.

There is ventricular and sulcal enlargement reflecting
age-appropriate volume loss. Bilateral white matter hypoattenuation
is also present consistent with advanced chronic microvascular
ischemic change. These findings are stable.

Vascular: No hyperdense vessel or unexpected calcification.

Skull: Normal. Negative for fracture or focal lesion.

Sinuses/Orbits: Unremarkable globes and orbits.  Sinuses are clear.

Other: None.
IMPRESSION: 1. No acute intracranial abnormalities.
2. Age-appropriate volume loss. Advanced chronic microvascular
ischemic change.

## 2023-10-05 IMAGING — DX DG RIBS W/ CHEST 3+V*R*
3 series · 3 of 3 positions shown · non-contrast
Comparison: None.

CLINICAL DATA: Tenderness along the posterior aspect of the right
fifth sixth rib.

EXAM:
RIGHT RIBS AND CHEST - 3+ VIEW

[rib pa]
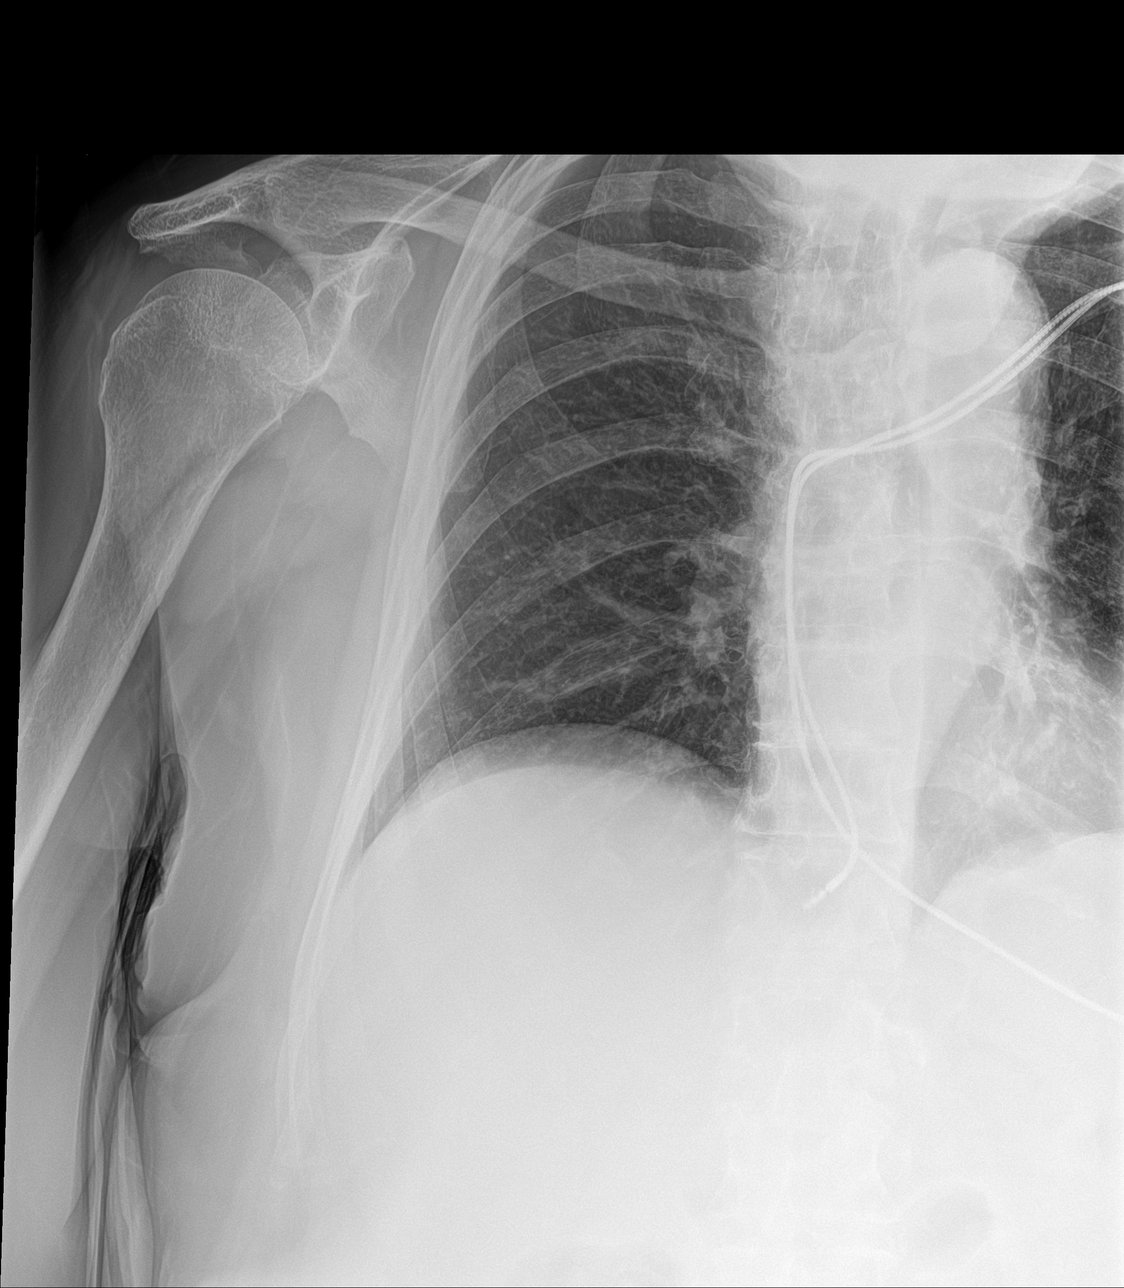

[rib obl]
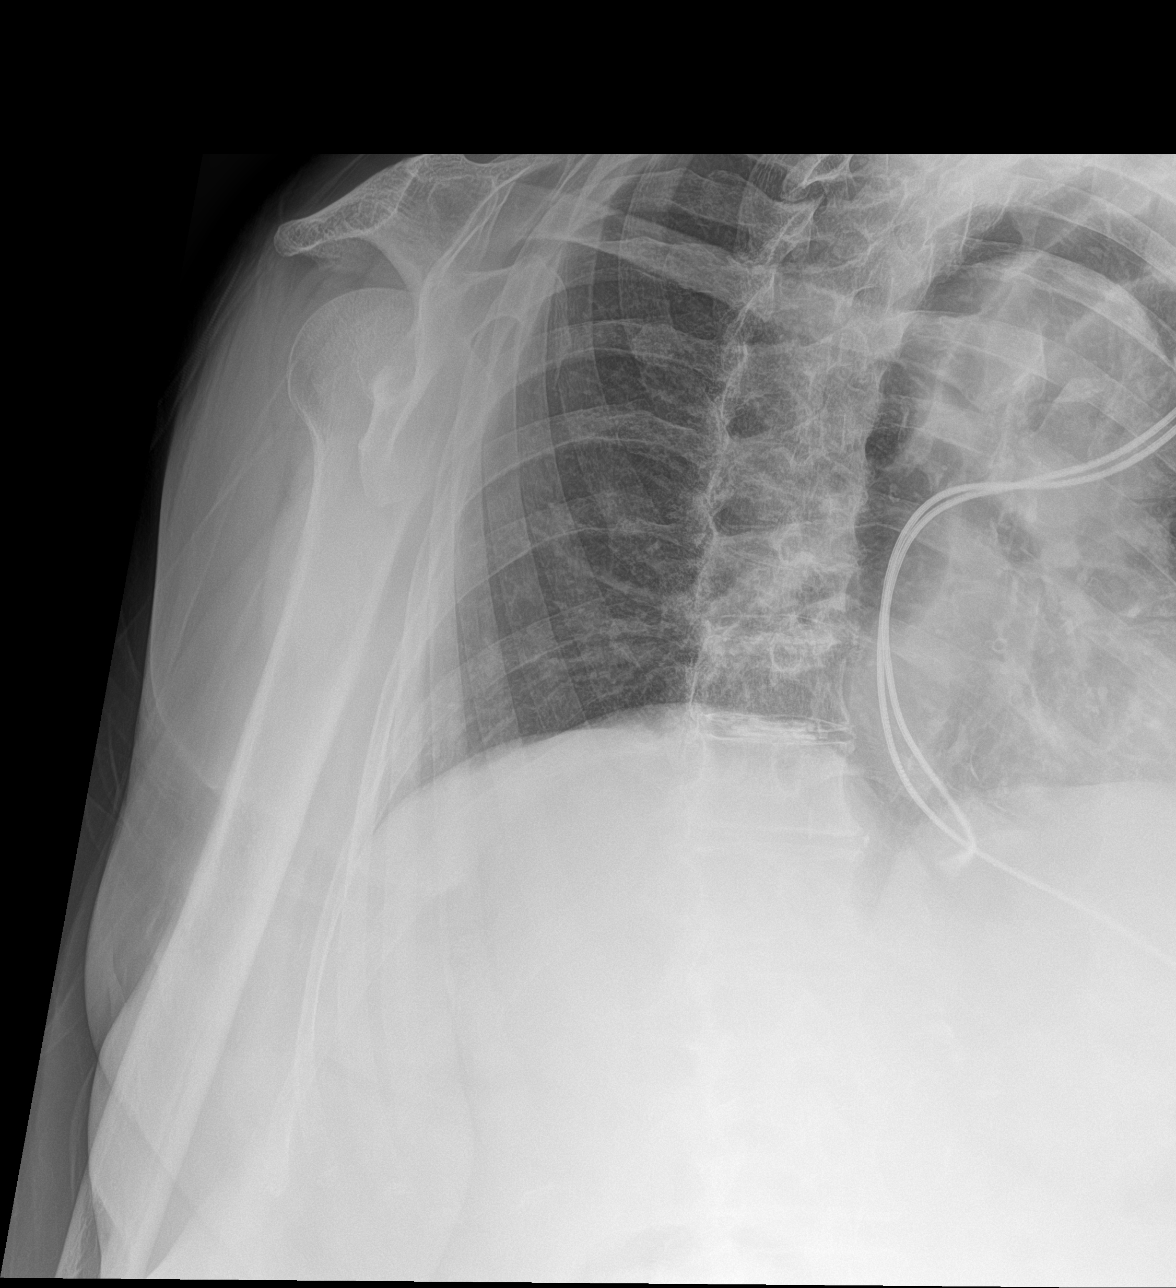

[chest ap]
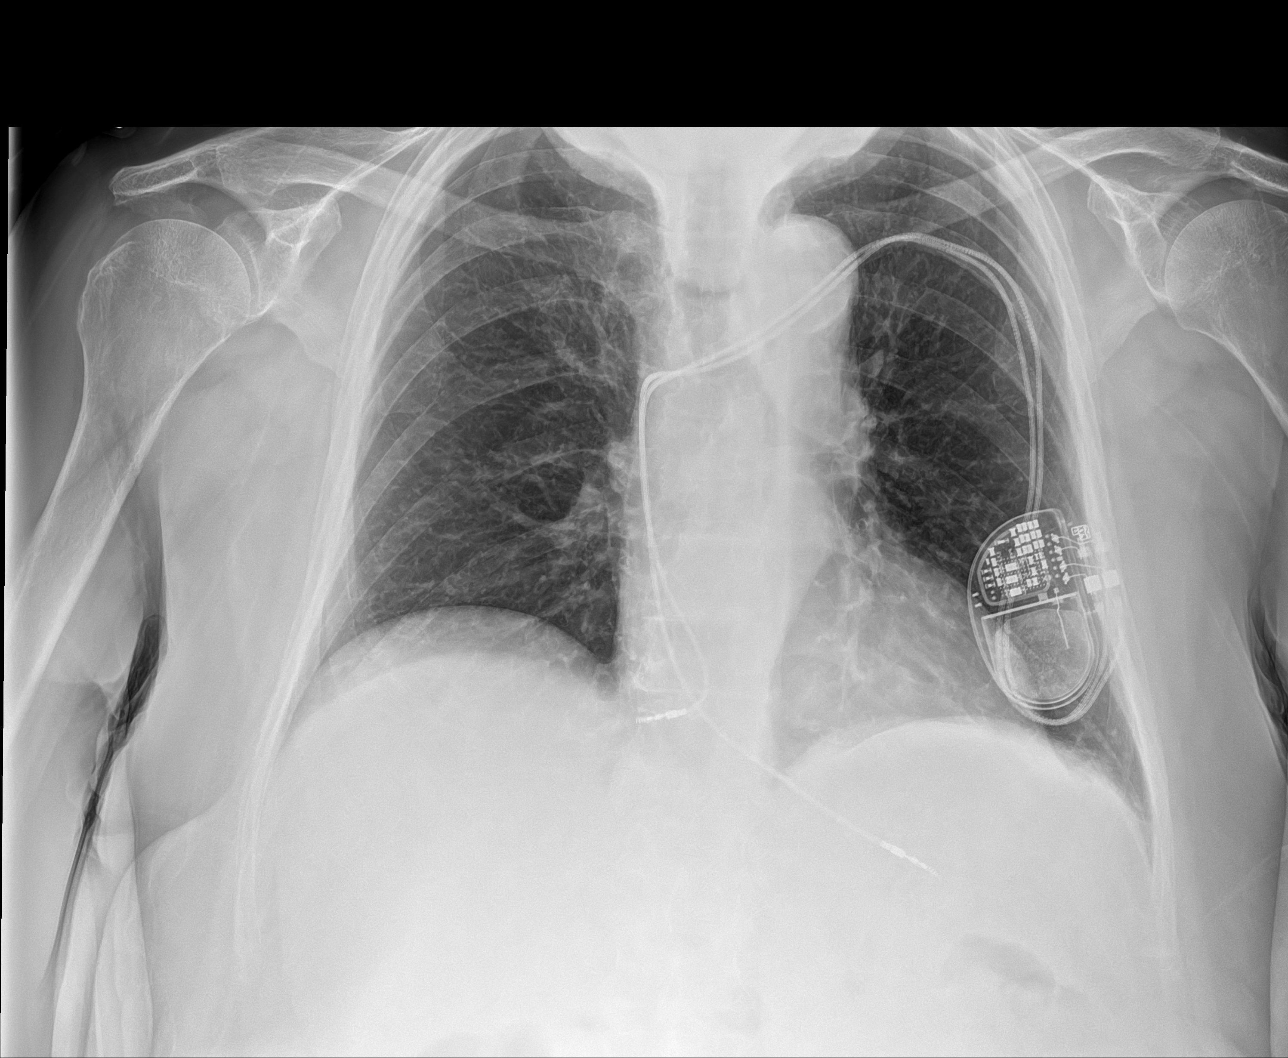

[3 of 3 positions shown; findings below may reference images not displayed]

FINDINGS: No fracture or other bone lesions are seen involving the ribs. There
is no evidence of pneumothorax or pleural effusion. Both lungs are
clear. Heart size and mediastinal contours are within normal limits.
Left chest wall dual lead pacemaker.
IMPRESSION: Negative.
